# Patient Record
Sex: Female | Born: 1940 | Race: White | Hispanic: No | Marital: Married | Smoking: Former smoker
Health system: Southern US, Community
[De-identification: ages and names within clinical notes are randomized; demographics above are authoritative.]

## PROBLEM LIST (undated history)

## (undated) DIAGNOSIS — I10 Essential (primary) hypertension: Secondary | ICD-10-CM

## (undated) DIAGNOSIS — R031 Nonspecific low blood-pressure reading: Secondary | ICD-10-CM

## (undated) DIAGNOSIS — I480 Paroxysmal atrial fibrillation: Secondary | ICD-10-CM

## (undated) DIAGNOSIS — I5022 Chronic systolic (congestive) heart failure: Secondary | ICD-10-CM

## (undated) DIAGNOSIS — J449 Chronic obstructive pulmonary disease, unspecified: Secondary | ICD-10-CM

## (undated) DIAGNOSIS — E785 Hyperlipidemia, unspecified: Secondary | ICD-10-CM

## (undated) DIAGNOSIS — T82118A Breakdown (mechanical) of other cardiac electronic device, initial encounter: Secondary | ICD-10-CM

## (undated) DIAGNOSIS — Z95810 Presence of automatic (implantable) cardiac defibrillator: Secondary | ICD-10-CM

## (undated) DIAGNOSIS — I447 Left bundle-branch block, unspecified: Secondary | ICD-10-CM

## (undated) DIAGNOSIS — R569 Unspecified convulsions: Secondary | ICD-10-CM

## (undated) DIAGNOSIS — S065X9A Traumatic subdural hemorrhage with loss of consciousness of unspecified duration, initial encounter: Secondary | ICD-10-CM

## (undated) DIAGNOSIS — E876 Hypokalemia: Secondary | ICD-10-CM

## (undated) DIAGNOSIS — E119 Type 2 diabetes mellitus without complications: Secondary | ICD-10-CM

## (undated) DIAGNOSIS — I428 Other cardiomyopathies: Secondary | ICD-10-CM

## (undated) DIAGNOSIS — S065XAA Traumatic subdural hemorrhage with loss of consciousness status unknown, initial encounter: Secondary | ICD-10-CM

## (undated) DIAGNOSIS — I5023 Acute on chronic systolic (congestive) heart failure: Secondary | ICD-10-CM

## (undated) HISTORY — DX: Unspecified convulsions: R56.9

## (undated) HISTORY — PX: CARDIAC CATHETERIZATION: SHX172

## (undated) HISTORY — DX: Type 2 diabetes mellitus without complications: E11.9

## (undated) HISTORY — DX: Other cardiomyopathies: I42.8

## (undated) HISTORY — DX: Hyperlipidemia, unspecified: E78.5

## (undated) HISTORY — PX: CHOLECYSTECTOMY: SHX55

## (undated) HISTORY — DX: Essential (primary) hypertension: I10

## (undated) HISTORY — DX: Acute on chronic systolic (congestive) heart failure: I50.23

## (undated) HISTORY — DX: Left bundle-branch block, unspecified: I44.7

## (undated) HISTORY — DX: Paroxysmal atrial fibrillation: I48.0

## (undated) HISTORY — DX: Presence of automatic (implantable) cardiac defibrillator: Z95.810

## (undated) HISTORY — DX: Nonspecific low blood-pressure reading: R03.1

## (undated) HISTORY — DX: Hypokalemia: E87.6

## (undated) HISTORY — DX: Chronic obstructive pulmonary disease, unspecified: J44.9

## (undated) HISTORY — DX: Chronic systolic (congestive) heart failure: I50.22

## (undated) HISTORY — DX: Breakdown (mechanical) of other cardiac electronic device, initial encounter: T82.118A

## (undated) HISTORY — PX: BRAIN HEMATOMA EVACUATION: SHX573

## (undated) HISTORY — DX: Traumatic subdural hemorrhage with loss of consciousness status unknown, initial encounter: S06.5XAA

## (undated) HISTORY — DX: Traumatic subdural hemorrhage with loss of consciousness of unspecified duration, initial encounter: S06.5X9A

---

## 1981-01-16 HISTORY — PX: TOTAL ABDOMINAL HYSTERECTOMY: SHX209

## 2004-01-17 HISTORY — PX: CARDIAC DEFIBRILLATOR PLACEMENT: SHX171

## 2009-07-20 ENCOUNTER — Telehealth (INDEPENDENT_AMBULATORY_CARE_PROVIDER_SITE_OTHER): Payer: Self-pay | Admitting: *Deleted

## 2009-07-20 ENCOUNTER — Ambulatory Visit: Payer: Self-pay | Admitting: Cardiovascular Disease

## 2009-11-01 ENCOUNTER — Ambulatory Visit: Payer: Self-pay | Admitting: Cardiovascular Disease

## 2009-12-06 ENCOUNTER — Encounter: Payer: Self-pay | Admitting: Internal Medicine

## 2009-12-06 ENCOUNTER — Ambulatory Visit: Payer: Self-pay | Admitting: Internal Medicine

## 2010-01-18 ENCOUNTER — Ambulatory Visit
Admission: RE | Admit: 2010-01-18 | Discharge: 2010-01-18 | Payer: Self-pay | Source: Home / Self Care | Attending: Internal Medicine | Admitting: Internal Medicine

## 2010-01-18 ENCOUNTER — Encounter: Payer: Self-pay | Admitting: Internal Medicine

## 2010-02-15 NOTE — Procedures (Signed)
Summary: Cardiology Device Clinic    ICD Specifications Following MD:  Virl Axe, MD     ICD Vendor:  [facility] Jude     ICD Model Number:  [phone]     ICD Serial Number:  [id number] ICD DOI:  10/10/2004     ICD Implanting MD:  NOT IMPLANTED BY [place]  ICD Follow Up Remote Check?  No Battery Voltage:  2.54 V     Charge Time:  12 seconds     Underlying rhythm:  sr ICD Dependent:  No       ICD Device Measurements Atrium:  Amplitude: 2.1 mV, Impedance: 430 ohms, Threshold: 0.75 V at 0.5 msec Right Ventricle:  Amplitude: 6.3 mV, Impedance: 345 ohms, Threshold: 0.75 V at 0.5 msec Left Ventricle:  Impedance: 710 ohms, Threshold: 1.25 V at 0.8 msec  Episodes MS Episodes:  4     Percent Mode Switch:  <1%     Shock:  0     ATP:  0     Nonsustained:  0     Atrial Pacing:  64%     Ventricular Pacing:  99%  Brady Parameters Mode DDDR     Lower Rate Limit:  75     Upper Rate Limit 130 PAV 140     Sensed AV Delay:  120  Tachy Zones VF:  182     VT:  167     Next Cardiology Appt Due:  01/16/2010 Tech Comments:  Outputs reprogrammed for chronic thresholds.  Atlas battery 2.54v.  She reached 2.55 10/10.  We will follow her every 2 months in [place]. Alma Friendly, LPN  November 21, [id number] 4:05 PM

## 2010-02-15 NOTE — Progress Notes (Signed)
----   Converted from flag ---- ---- 07/20/2009 2:19 PM, Charm Rings, CMA, AAMA wrote: Pt has [employer] GC, no precert required  ---- [id number] 1:52 PM, Rhett Bannister wrote: Echo scheduled at [facility]. for 07/22/09 with Dx: 428.0. Thanks Anderson Malta ------------------------------

## 2010-02-15 NOTE — Progress Notes (Signed)
----   Converted from flag ---- ---- 07/20/2009 1:11 PM, Charm Rings, CMA, AAMA wrote: PT HAS [employer] GC, NO PRECERT REQ  ---- [id number] 12:01 PM, Rhett Bannister wrote: Echo scheduled for [facility]. on Thurs. 07/22/09 with Dx: Dede Query Thanks, Anderson Malta ------------------------------

## 2010-02-17 NOTE — Procedures (Signed)
Summary: Cardiology Device Clinic    ICD Specifications Following MD:  Virl Axe, MD     ICD Vendor:  [facility] Jude     ICD Model Number:  [phone]     ICD Serial Number:  [id number] ICD DOI:  10/10/2004     ICD Implanting MD:  NOT IMPLANTED BY [place]  ICD Follow Up Remote Check?  No Battery Voltage:  2.54 V     Charge Time:  12.0 seconds     Underlying rhythm:  Loletha Grayer ICD Dependent:  No       ICD Device Measurements Atrium:  Amplitude: 2.6 mV, Impedance: 435 ohms, Threshold: 0.75 V at 0.5 msec Right Ventricle:  Amplitude: 6.1 mV, Impedance: 370 ohms, Threshold: 0.75 V at 0.5 msec Left Ventricle:  Impedance: 750 ohms, Threshold: 1.25 V at 0.8 msec  Episodes MS Episodes:  2     Percent Mode Switch:  <1%     Shock:  0     ATP:  0     Nonsustained:  0     Atrial Pacing:  59%     Ventricular Pacing:  99%  Brady Parameters Mode DDDR     Lower Rate Limit:  75     Upper Rate Limit 130 PAV 140     Sensed AV Delay:  120  Tachy Zones VF:  182     VT:  167     Next Cardiology Appt Due:  03/17/2010 Tech Comments:  No parameter changes.  Device fuction normal.  Atlas device battery @ 2.55 since 10/10.  ROV 2 months with Dr. Caryl Comes in [place]. Alma Friendly, LPN  January  3, [id number] 10:17 AM

## 2010-03-08 ENCOUNTER — Ambulatory Visit (INDEPENDENT_AMBULATORY_CARE_PROVIDER_SITE_OTHER): Payer: Medicare PPO | Admitting: Cardiovascular Disease

## 2010-03-08 DIAGNOSIS — I5022 Chronic systolic (congestive) heart failure: Secondary | ICD-10-CM

## 2010-03-08 DIAGNOSIS — I4891 Unspecified atrial fibrillation: Secondary | ICD-10-CM

## 2010-03-08 DIAGNOSIS — E7800 Pure hypercholesterolemia, unspecified: Secondary | ICD-10-CM

## 2010-03-11 ENCOUNTER — Encounter: Payer: Self-pay | Admitting: Internal Medicine

## 2010-03-11 ENCOUNTER — Encounter (INDEPENDENT_AMBULATORY_CARE_PROVIDER_SITE_OTHER): Payer: Medicare PPO | Admitting: Internal Medicine

## 2010-03-11 DIAGNOSIS — I4891 Unspecified atrial fibrillation: Secondary | ICD-10-CM

## 2010-03-11 DIAGNOSIS — Z95810 Presence of automatic (implantable) cardiac defibrillator: Secondary | ICD-10-CM

## 2010-03-11 DIAGNOSIS — I428 Other cardiomyopathies: Secondary | ICD-10-CM

## 2010-03-14 ENCOUNTER — Ambulatory Visit: Payer: Self-pay | Admitting: Cardiovascular Disease

## 2010-03-24 NOTE — Assessment & Plan Note (Signed)
Summary: f9m/end of life/glc/glc     ICD Specifications Following MD:  Virl Axe, MD     ICD Vendor:  [employer]     ICD Model Number:  [phone]     ICD Serial Number:  [id number] ICD DOI:  10/10/2004     ICD Implanting MD:  NOT IMPLANTED BY [place]  ICD Follow Up ICD Dependent:  No      Brady Parameters Mode DDDR     Lower Rate Limit:  75     Upper Rate Limit 130 PAV 140     Sensed AV Delay:  120  Tachy Zones VF:  182     VT:  167

## 2010-03-24 NOTE — Cardiovascular Report (Signed)
Summary: Office Visit   Office Visit   Imported By: Sallee Provencal 03/17/2010 15:58:22  _____________________________________________________________________  External Attachment:    Type:   Image     Comment:   External Document

## 2010-04-08 NOTE — Assessment & Plan Note (Signed)
[place] CARDIOLOGY OFFICE NOTE  NAME:[name], [name]                          MRN:          [id number] DATE:03/08/2010                            DOB:          10-09-1940   [name] is a 70 year old female who is here today for a followup visit.  She has the following problem list: 1. 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  Her     previous ejection fraction was less than 35%.  Most recent ejection     fraction was normal as of July 2011. 2. No significant coronary artery disease per cardiac catheterization     in the past. 3. Paroxysmal atrial fibrillation on long-term anticoagulation which     is being managed by Dr. Jannette Fogo. 1. Status post implantable cardioverter-defibrillator placement for     primary prevention of sudden death which is biventricular. 2. Chronic obstructive pulmonary disease. 3. Type 2 diabetes. 4. Hyperlipidemia. 5. Vitamin D deficiency.  INTERVAL HISTORY:  The patient is here today for a followup visit. Overall, she has been doing very well.  There has been no change in her dyspnea which only happens if she overexerts herself.  There is no orthopnea or PND.  There is no lower extremity edema.  During her last visit with Dr. Caryl Comes, her device was interrogated and there were no episodes of atrial fibrillation.  Thus, her digoxin was stopped.  Also, her furosemide and potassium were cut in half.  Simvastatin dose was decreased also to 40 mg.  MEDICATIONS: 1. Carvedilol 25 mg twice daily. 2. Warfarin as directed. 3. Albuterol 2 puffs four times daily as needed. 4. TriCor 145 mg once daily. 5. Vitamin D once daily. 6. Simvastatin 40 mg at bedtime. 7. Furosemide 40 mg half a tablet once daily. 8. Potassium 10 mEq half tablet once daily. 9. Quinapril 20 mg half tablet once daily. 10.Januvia 100 mg once daily.  PHYSICAL EXAMINATION:  VITAL SIGNS:  Weight is 131.4 pounds,  blood pressure is 126/65, pulse is 75, oxygen saturation is 97% on room air. NECK:  No JVD or carotid bruits. LUNGS:  Clear to auscultation. HEART:  Regular rate and rhythm with no gallops or murmurs. ABDOMEN:  Benign, nontender, nondistended. EXTREMITIES:  With no clubbing, cyanosis, or edema.  IMPRESSION: 1. Congestive heart failure:  She continues to be in [place] Heart     Association Class II.  She has no evidence of fluid overload in     spite of cutting the dose of furosemide by half.  We will continue     with current management for now.  I agree with stopping digoxin     given that she has not been having frequent episodes of atrial     fibrillation, and her ejection fraction has normalized.  We will     consider stopping all her diuretic in the near future if she     remains stable. 2. Paroxysmal atrial fibrillation:  We will continue long-term     anticoagulation with warfarin.  She has not had any recent episodes     of  this. 3. Hyperlipidemia:  Continue with simvastatin 40 mg at bedtime. 4. Status post implantable cardioverter-defibrillator placement, the     device approaching end of life.  She will be following up with Dr.     Caryl Comes to discuss further management options.  She will follow up     with me in 6 months from now or earlier if needed.    Kathlyn Sacramento, MD Electronically Signed   MA/MedQ  DD: 03/08/2010  DT: 03/09/2010  Job #: [id number]

## 2010-04-08 NOTE — Assessment & Plan Note (Signed)
[place] CARDIOLOGY OFFICE NOTE  NAME:[name], [name]                          MRN:          [id number] DATE:03/11/2010                            DOB:          August 25, 1940   [name] is seen in followup for CRT-D implanted at [facility] for nonischemic cardiomyopathy.  There has been intercurrent normalization of left ventricular systolic function by an echo last summer demonstrating an ejection fraction of 55%-60%.  The patient has no symptoms of congestive heart failure.  She remains on carvedilol, warfarin, nebulizer, simvastatin 20, furosemide and potassium.  There has been gradual down titration of her diuretics.  ON EXAMINATION:  Her blood pressure is 105/60.  Her weight was 133.  Her pulse was 75, all of which were stable.  Her lungs were clear. HEART:  Sounds were regular. ABDOMEN:  Soft with active bowel sounds. EXTREMITIES:  Without edema. SKIN:  Warm and dry.  She was in no acute distress.  LABORATORIES:  From September 2011 demonstrated BUN 22, creatinine 1.3 with a subsequent down titration of her diuretics.  Interrogation of her [place] CRT-D demonstrated a battery voltage of 2.54.  the P-wave was 2.5, RO of 5.9, thresholds were 0.75/0.75/1.25- L. Impedances were 425-A, 340-R, 740-L.  High-voltage impedance was 67 ohms.  No intercurrent therapies.  IMPRESSION: 1. Nonischemic cardiomyopathy. 2. Paroxysmal atrial fibrillation with intercurrent nonsustained     episodes. 3. Status post cardiac resynchronization therapy device approaching     ERI with estimated longevity of 5-7 months.  We will plan to see her again in 3 months.    Deboraha Sprang, MD, [facility]    SCK/MedQ  DD: 03/11/2010  DT: 03/11/2010  Job #: [id number]

## 2010-05-31 NOTE — Assessment & Plan Note (Signed)
[place] CARDIOLOGY OFFICE NOTE   NAME:[name], [name]                          MRN:          [id number]  DATE:11/01/2009                            DOB:          Nov 28, 1940    [name] is a 70 year old female who is here today for a followup  visit.  She has the following problem list:  1. 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      Previous ejection fraction was less than 35%.  Most recent ejection      fraction was normal as of July 2011.  2. No significant coronary artery disease per cardiac catheterization      in the past.  3. Paroxysmal atrial fibrillation on long-term anticoagulation.  4. Status post implantable cardioverter-defibrillator placement for      primary prevention of sudden death.  5. Chronic obstructive pulmonary disease.  6. Type 2 diabetes.  7. Hyperlipidemia.  8. Vitamin D deficiency.   The patient is here today for a followup visit.  Since her last visit,  the patient underwent cholecystectomy without any complications.  She  resumed anticoagulation postoperatively with no bleeding issues.  She  continues to do well.  She denies any chest pain.  She does have  exertional dyspnea only at significant activity level.  She gets  occasional palpitations.  There has been no syncope or presyncope.   MEDICATIONS:  1. Digoxin 0.125 mg once daily.  2. Quinapril 20 mg half a tablet daily.  3. Potassium 10 mEq, 1.5 tablet once daily.  4. Furosemide 40 mg once daily.  5. Carvedilol 25 mg twice daily.  6. Warfarin as directed.  7. Albuterol.  8. TriCor 145 mg once daily.  9. Simvastatin 80 mg at bedtime.   ALLERGIES:  CODEINE and VALIUM.   PHYSICAL EXAMINATION:  VITAL SIGNS:  Weight is 128 pounds, blood  pressure is 109/58, pulse is 74, oxygen saturation is 97% on room air.  NECK:  No JVD or carotid bruits.  LUNGS:  Clear to auscultation.  HEART:  Regular rate and rhythm  with no gallops or murmurs.  ABDOMEN:  Benign, nontender, nondistended.  EXTREMITIES:  With no edema bilaterally.   IMPRESSION:  1. Congestive heart failure:  Currently, she is in [place] class II.  Most recent ejection fraction was back to      normal.  We will continue treatment with an ACE inhibitor and      carvedilol.  She appears to be euvolemic on current dose of      furosemide.  I believe she is on digoxin for atrial fibrillation      and not heart failure.  2. Paroxysmal atrial fibrillation:  We will have her see Dr. Caryl Comes for      device interrogation.  If she is not having frequent episodes of      atrial fibrillation, then we will probably stop digoxin altogether      given that her ejection fraction is normal now.  We will continue  long-term anticoagulation with warfarin.  3. Hyperlipidemia:  She did have recent labs done at her primary care      physician's office Dr. Elnoria Howard.  I will request these labs.  I will      consider decreasing the dose of simvastatin to 80 mg given      especially that she is also on Tricor.  She will follow up in 4      months from now or earlier if needed.     Kathlyn Sacramento, MD  Electronically Signed    MA/MedQ  DD: 11/01/2009  DT: 11/02/2009  Job #: [id number]

## 2010-05-31 NOTE — Assessment & Plan Note (Signed)
[place] CARDIOLOGY OFFICE NOTE   NAME:[name], [name]                          MRN:          [id number]  DATE:01/18/2010                            DOB:          06-Jan-1941    [name] was seen in followup for CRT-D implanted in [facility].  She  is now approaching URI.  She has had intercurrent normalization of left  ventricular systolic function.   She has a history of PAF.   She denies significant symptoms of shortness of breath, chest pain, or  peripheral edema.   Medications are reviewed and are unchanged except for the decrease of  her simvastatin from 80 to 40 mg and furosemide from 40 to 20 and  potassium from 20 to 10.   PHYSICAL EXAMINATION:  VITAL SIGNS:  On examination today, her blood  pressure was 127/76 with pulse of 75, the weight was 129.  LUNGS:  Clear.  HEART:  Heart sounds were regular.  ABDOMEN:  Soft.  EXTREMITIES:  Without edema.   Interrogation of her pacemaker demonstrated that her battery voltage was  2.54.  She is on the plateau for about 14-15 months.   We will need to check her again in 2 months' time given the end-of-life  characteristics of this device.     [name] Sprang, MD, [facility]     SCK/MedQ  DD: 01/18/2010  DT: 01/19/2010  Job #: [id number]

## 2010-05-31 NOTE — Letter (Signed)
July 20, 2009    Kendell Bane, MD  [facility], [place] [address]   RE:  REEDA, HOLLERS  MRN:  [id number]  /  DOB:  [birth year]   REASON FOR CONSULTATION:  Preoperative evaluation for cholecystectomy.   Dear Dr. Amalia Hailey:   Thank you for referring Ms. [name] for preoperative cardiovascular  evaluation.  As you are aware, this is a pleasant 70 year old female  with extensive cardiac history.  She has a history of congestive heart  failure due to nonischemic cardiomyopathy.  Her ejection fraction was  quite low in the past and less than 35%.  Her cardiac evaluation at that  time showed no significant coronary artery disease.  She underwent an  implantable cardioverter-defibrillator placement about 4 years ago and  since then, she has been relatively stable.  She does have history of  paroxysmal atrial fibrillation on long-term anticoagulation.  Most of  her heart failure symptoms at this time include dyspnea with activities.  She has no significant orthopnea or paroxysmal nocturnal dyspnea.  There  is no chest pain.  Overall, she is able to perform her activities of  daily living and walks 2 to 3 times a day outside the house.  She seems  to be a [place] Heart Association class II functional capacity.  She is  planned to have a cholecystectomy done.   PAST MEDICAL HISTORY:  1. 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  Most      recent ejection fraction was 50% to 55% in 2010.  2. No significant coronary artery disease per previous      catheterization.  3. Paroxysmal atrial fibrillation on long-term anticoagulation.  4. Status post implantable cardioverter-defibrillator placement for      primary prevention of sudden death.  5. Chronic obstructive pulmonary disease.  6. Type 2 diabetes.  7. Hyperlipidemia.  8. Vitamin D deficiency.   ALLERGIES:  CODEINE and VALIUM.   CURRENT MEDICATIONS:  1. Vitamin D.  This has been  stopped recently.  2. Digoxin 0.125 mg once daily.  3. Quinapril 10 mg daily.  4. Potassium chloride 10 mEq 1-1/2 tablets daily.  5. Furosemide 40 mg once a day.  6. Carvedilol 25 mg twice daily.  7. Warfarin as directed.  8. TriCor 145 mg once daily.  9. Simvastatin 80 mg once daily.   SOCIAL HISTORY:  Negative for smoking.  She quit in 1980.  There is no  history of alcohol or recreational drug use.   FAMILY HISTORY:  Negative for premature coronary artery disease.   PHYSICAL EXAMINATION:  VITAL SIGNS:  Weight is 130.4 pounds, blood  pressure 115/64 and heart rate is 75 beats per minute.  NECK EXAMINATION:  Reveals no JVD or carotid bruits.  LUNGS:  Clear to auscultation.  CARDIOVASCULAR EXAMINATION:  Her point of maximal impulse is slightly  laterally displaced, normal S1/S2.  There are no gallops or murmurs.  ABDOMEN:  Benign, nontender, nondistended.  EXTREMITIES:  Trace edema bilaterally.  There is no clubbing or  cyanosis.   LABORATORY AND DIAGNOSTIC DATA:  Her electrocardiogram shows  arteriovenous sequential pacing.   IMPRESSION:  1. Preoperative evaluation for cholecystectomy:  The patient has an      extensive cardiac history including congestive heart failure.  She      has no history of coronary artery disease.  Her most recent      ejection fraction was 50% to 55%.  She appears to  be clinically      stable and has reasonable functional capacity.  Thus, she should be      considered at low-to-moderate risk for planned surgery.  I do not      think a stress test is needed in this situation.  I would like to      get an echocardiogram done before her surgery to ensure no      deterioration in her ejection fraction or valvular disease.  Most      recent echocardiogram showed an ejection fraction of 50% to 55%      with mild-to-moderate tricuspid regurgitation.  If her      echocardiogram is not significantly changed, then she can proceed      with the planned  surgery with an acceptable risk.  Regarding      management of her medications, I recommend the following:      a.     Her warfarin can be discontinued 5-7 days before surgery       without the need for bridging.  She has no previous history of       stroke or embolic complications.      b.     Her cardiac medications should be continued except for       stopping her furosemide on the day of her surgery.      c.     She does have a defibrillator which is a [employer] Jude device.  I       recommend placing a magnet on the device during the surgery and       then removing it after the surgery.  2. Congestive heart failure with reduced left ventricle systolic      function.  She is currently on good medical therapy including a      beta blocker, ACE inhibitor, digoxin and diuretic.  I would      continue with current medications.  If her ejection fraction is      close to normal, I would consider stopping digoxin in the future.  3. Paroxysmal atrial fibrillation.  I will continue with long-term      anticoagulation but will hold warfarin for planned surgery.  4. Hyperlipidemia:  She is on high-dose simvastatin and TriCor and      seems to be tolerating it.  I would consider reducing the dose of      simvastatin to 40 mg.   Thank you for allowing me to participate in the care of your patient.    Sincerely,      Kathlyn Sacramento, MD  Electronically Signed    MA/MedQ  DD: 07/20/2009  DT: 07/20/2009  Job #: [id number]

## 2010-05-31 NOTE — Assessment & Plan Note (Signed)
[place] CARDIOLOGY OFFICE NOTE   NAME:Casciano, Serrina                          MRN:          [id number]  DATE:12/06/2009                            DOB:          01/25/1940    Ms. Haslem is seen at the request of Dr. Fletcher Anon to establish device  followup.   She is a 70 year old woman with a nonischemic cardiomyopathy identified  about 5 years ago while being seen in [place].  She had nonischemic  heart disease by catheterization and ejection fraction of about 10% if  she recalls.  She underwent CRTD implantation for primary prevention.   Most recent ejection fraction assessment was in July of this year  demonstrating normal LV function.   She also has a history of PAF for which she takes Coumadin.  Thromboembolic risk factors are notable for age, gender, diabetes, and  congestive failure.   She currently denies significant shortness of breath or chest  discomfort.  There has been no peripheral edema.   Medications include digoxin 0.125, quinapril 10, potassium, furosemide,  carvedilol 25 b.i.d., warfarin, albuterol, TriCor 145, simvastatin 80,  and vitamin D.   Allergies include CODEINE and VALIUM.   PAST MEDICAL HISTORY:  Notable as above in addition to COPD.   SOCIAL HISTORY:  Negative for smoking.  No alcohol or recreational drug  use.   FAMILY HISTORY:  Negative for coronary disease.   PRIOR SURGERIES:  Include hysterectomy.   PHYSICAL EXAMINATION:  GENERAL:  She is an elderly Caucasian female,  appearing her stated age of 61.  VITAL SIGNS:  Her blood pressure is 119/57, her pulse is 74, weight is  129.6.  HEENT:  Normal.  NECK:  Her neck veins are flat.  Her carotids are brisk and full  bilaterally without bruits and there is no lymphadenopathy.  BACK:  Without scoliosis or mild kyphosis.  LUNGS:  Clear.  HEART:  Heart sounds were regular without murmurs or gallops.  ABDOMEN:  Soft with active  bowel sounds.  EXTREMITIES:  Without edema.  Distal pulses are intact.  NEUROLOGICAL:  Grossly normal.  SKIN:  Warm and dry.  PSYCHIATRIC:  Her affect was engaging.   Electrocardiogram reviewed from July 20, 2009, demonstrated an AV paced  rhythm at 75.   Interrogation of her outlet ICD demonstrates a P-wave of 2.1, impedance  of 430, threshold of 0.75, the R-wave was 6.3, with a pace impedance of  345 with threshold of 0.75 and LV impedance of 710 with threshold of  1.25.  Battery voltage 2.54, having just come off of the 2.55 plateau  after about a year.   IMPRESSION:  1. Nonischemic cardiomyopathy with normalization of left ventricular      function over time and with cardiac resynchronization therapy      defibrillator.  2. Status post cardiac resynchronization therapy defibrillator is with      a caveat as noted above, approaching the RI.  3. Paroxysmal atrial fibrillation without significant intercurrent      events identified by her device.  Her 4 episodes were all of  less      than 1 minute duration.  4. Hyperlipidemia.   Ms. Deng is doing amazingly well with the intercurrent normalization  of her LV systolic function.  She is approaching the RI, we will see her  on a 20-month followup basis as she approaches that plateau.   With normalization of LV function, we will discontinue digoxin.   Given the recent issues with high-dose simvastatin, we will decrease  this to 40 mg a day.  In addition, I have decreased her furosemide from  40 - 20 mg and has changed her potassium to 10 mg a day.     Deboraha Sprang, MD, [facility]     SCK/MedQ  DD: 12/06/2009  DT: 12/07/2009  Job #: [id number]

## 2010-06-09 ENCOUNTER — Ambulatory Visit (INDEPENDENT_AMBULATORY_CARE_PROVIDER_SITE_OTHER): Payer: Medicare PPO | Admitting: *Deleted

## 2010-06-09 ENCOUNTER — Encounter: Payer: Medicare PPO | Admitting: *Deleted

## 2010-06-09 DIAGNOSIS — I428 Other cardiomyopathies: Secondary | ICD-10-CM

## 2010-08-15 ENCOUNTER — Encounter: Payer: Self-pay | Admitting: Internal Medicine

## 2010-08-29 ENCOUNTER — Encounter: Payer: Self-pay | Admitting: Cardiovascular Disease

## 2010-09-01 ENCOUNTER — Ambulatory Visit (INDEPENDENT_AMBULATORY_CARE_PROVIDER_SITE_OTHER): Payer: Medicare PPO | Admitting: Cardiovascular Disease

## 2010-09-01 ENCOUNTER — Encounter: Payer: Self-pay | Admitting: Cardiovascular Disease

## 2010-09-01 DIAGNOSIS — I4891 Unspecified atrial fibrillation: Secondary | ICD-10-CM

## 2010-09-01 DIAGNOSIS — Z95810 Presence of automatic (implantable) cardiac defibrillator: Secondary | ICD-10-CM

## 2010-09-01 DIAGNOSIS — I480 Paroxysmal atrial fibrillation: Secondary | ICD-10-CM | POA: Insufficient documentation

## 2010-09-01 DIAGNOSIS — I10 Essential (primary) hypertension: Secondary | ICD-10-CM

## 2010-09-01 DIAGNOSIS — I509 Heart failure, unspecified: Secondary | ICD-10-CM

## 2010-09-01 MED ORDER — QUINAPRIL HCL 10 MG PO TABS: 10.0000 mg | ORAL_TABLET | Freq: Every day | ORAL | Status: DC

## 2010-09-01 NOTE — Assessment & Plan Note (Signed)
Continue long-term anticoagulation with warfarin which is being managed by her primary care physician.

## 2010-09-01 NOTE — Assessment & Plan Note (Signed)
Patient seems to be doing reasonably well at this time. She appears to be euvolemic on current dose of furosemide. Her most recent ejection fraction was normal. Continue treatment with carvedilol as well as quinapril which will be increased to 10 mg once daily.

## 2010-09-01 NOTE — Assessment & Plan Note (Signed)
Her blood pressure is elevated. Accupril will be increased to 10 mg once daily.

## 2010-09-01 NOTE — Patient Instructions (Signed)
Your physician recommends that you schedule a follow-up appointment in: 4 months  Your physician has recommended you make the following change in your medication: INCREASE Accupril to 10 mg daily

## 2010-09-01 NOTE — Assessment & Plan Note (Signed)
The patient is supposedly at end-of-life of battery. I will schedule her that the device clinic next month and she will likely need to followup with Dr. Caryl Comes soon.

## 2010-09-01 NOTE — Progress Notes (Signed)
HPI  This is a 70 year old female who is here today for a followup visit. 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 She had a previous cardiac catheterization that showed no significant coronary artery disease. She also has paroxysmal atrial fibrillation on long-term anticoagulation with warfarin which is being managed by her primary care physician. She is status post ICD CRT placement. Overall she has been doing reasonably well. She denies any chest pain. There has been no change in her dyspnea. There is no palpitations, syncope or presyncope.  Allergies  Allergen Reactions  . Codeine   . Valium      Current Outpatient Prescriptions on File Prior to Visit  Medication Sig Dispense Refill  . ALBUTEROL SULFATE IN Inhale into the lungs.        . cyanocobalamin (,VITAMIN B-12,) 1000 MCG/ML injection Inject 1,000 mcg into the muscle every 30 (thirty) days.        . ergocalciferol (VITAMIN D2) 50000 UNITS capsule Take 50,000 Units by mouth 2 (two) times a week.        . furosemide (LASIX) 20 MG tablet Take 10 mg by mouth daily.       . potassium chloride (KLOR-CON) 10 MEQ CR tablet Take 5 mEq by mouth daily.       . simvastatin (ZOCOR) 40 MG tablet Take 20 mg by mouth at bedtime.        . sitaGLIPtan (JANUVIA) 100 MG tablet Take 100 mg by mouth daily.        [name] warfarin (COUMADIN) 6 MG tablet Take 6 mg by mouth daily. As directed          Past Medical History  Diagnosis Date  . NICM (nonischemic cardiomyopathy)   . COPD (chronic obstructive pulmonary disease)   . DM2 (diabetes mellitus, type 2)   . HLD (hyperlipidemia)   . Vitamin D deficiency   . CHF (congestive heart failure)     initial EF of 35%. Most recently was normal in 07/2009  . PAF (paroxysmal atrial fibrillation)   . Hypertension      Past Surgical History  Procedure  Date  . Cardiac defibrillator placement 2006  . Total abdominal hysterectomy 1983  . Cardiac catheterization     no significant CAD     Family History  Problem Relation Age of Onset  . Heart disease       History   Social History  . Marital Status: Married    Spouse Name: N/A    Number of Children: 4  . Years of Education: N/A   Occupational History  . retired    Social History Main Topics  . Smoking status: Former Smoker    Quit date: 01/17/1968  . Smokeless tobacco: Not on file  . Alcohol Use: No  . Drug Use: No  . Sexually Active: Not on file   Other Topics Concern  . Not on file   Social History Narrative   Registration notes - ICD = [employer]      PHYSICAL EXAM   BP 150/78  Pulse 75  Ht 5\' 4"  (1.626 m)  Wt 141 lb (63.957 kg)  BMI 24.20 kg/m2  SpO2 97%  Constitutional: She is oriented to person, place, and time. She appears well-developed and well-nourished. No distress.  HENT: No nasal discharge.  Head: Normocephalic and atraumatic.  Eyes: Pupils are equal,  round, and reactive to light. Right eye exhibits no discharge. Left eye exhibits no discharge.  Neck: Normal range of motion. Neck supple. No JVD present. No thyromegaly present.  Cardiovascular: Normal rate, regular rhythm, normal heart sounds and intact distal pulses. Exam reveals no gallop and no friction rub.  No murmur heard.  Pulmonary/Chest: Effort normal and breath sounds normal. No stridor. No respiratory distress. She has no wheezes. She has no rales. She exhibits no tenderness.  Abdominal: Soft. Bowel sounds are normal. She exhibits no distension. There is no tenderness. There is no rebound and no guarding.  Musculoskeletal: Normal range of motion. She exhibits no edema and no tenderness.  Neurological: She is alert and oriented to person, place, and time. Coordination normal.  Skin: Skin is warm and dry. No rash noted. She is not diaphoretic. No erythema. No pallor.  Psychiatric: She  has a normal mood and affect. Her behavior is normal. Judgment and thought content normal.      ASSESSMENT AND PLAN

## 2010-09-15 ENCOUNTER — Encounter: Payer: Self-pay | Admitting: Cardiovascular Disease

## 2010-10-04 ENCOUNTER — Ambulatory Visit (INDEPENDENT_AMBULATORY_CARE_PROVIDER_SITE_OTHER): Payer: Medicare PPO | Admitting: *Deleted

## 2010-10-04 ENCOUNTER — Encounter: Payer: Self-pay | Admitting: Internal Medicine

## 2010-10-04 DIAGNOSIS — I428 Other cardiomyopathies: Secondary | ICD-10-CM

## 2010-10-04 LAB — ICD DEVICE OBSERVATION
AL THRESHOLD: 0.75 V
ATRIAL PACING ICD: 42 pct
BAMS-0001: 180 {beats}/min
BAMS-0003: 75 {beats}/min
DEVICE MODEL ICD: 330633
LV LEAD IMPEDENCE ICD: 810 Ohm
RV LEAD AMPLITUDE: 6.4 mv
TOT-0008: 0
TOT-0009: 0
TOT-0010: 39
TZAT-0004SLOWVT: 8
TZAT-0012SLOWVT: 200 ms
TZAT-0018SLOWVT: NEGATIVE
TZAT-0019SLOWVT: 7.5 V
TZAT-0020SLOWVT: 1 ms
TZON-0003SLOWVT: 360 ms
TZON-0004SLOWVT: 12
TZON-0005SLOWVT: 6
TZON-0010SLOWVT: 80 ms
TZST-0001SLOWVT: 3
TZST-0001SLOWVT: 4
TZST-0003SLOWVT: 36 J
TZST-0003SLOWVT: 36 J

## 2010-10-04 NOTE — Progress Notes (Signed)
ICD check

## 2010-12-05 ENCOUNTER — Ambulatory Visit (HOSPITAL_COMMUNITY)
Admission: RE | Admit: 2010-12-05 | Discharge: 2010-12-05 | Disposition: A | Discharge status: Home / Self Care | Payer: Medicare PPO | Source: Ambulatory Visit | Attending: Internal Medicine | Admitting: Internal Medicine

## 2010-12-05 ENCOUNTER — Other Ambulatory Visit: Payer: Self-pay | Admitting: *Deleted

## 2010-12-05 ENCOUNTER — Encounter: Payer: Self-pay | Admitting: Internal Medicine

## 2010-12-05 ENCOUNTER — Ambulatory Visit (INDEPENDENT_AMBULATORY_CARE_PROVIDER_SITE_OTHER): Payer: Medicare PPO | Admitting: *Deleted

## 2010-12-05 DIAGNOSIS — J4489 Other specified chronic obstructive pulmonary disease: Secondary | ICD-10-CM | POA: Insufficient documentation

## 2010-12-05 DIAGNOSIS — Z95810 Presence of automatic (implantable) cardiac defibrillator: Secondary | ICD-10-CM

## 2010-12-05 DIAGNOSIS — I428 Other cardiomyopathies: Secondary | ICD-10-CM

## 2010-12-05 DIAGNOSIS — R0602 Shortness of breath: Secondary | ICD-10-CM | POA: Insufficient documentation

## 2010-12-05 DIAGNOSIS — R079 Chest pain, unspecified: Secondary | ICD-10-CM | POA: Insufficient documentation

## 2010-12-05 DIAGNOSIS — J449 Chronic obstructive pulmonary disease, unspecified: Secondary | ICD-10-CM | POA: Insufficient documentation

## 2010-12-05 LAB — ICD DEVICE OBSERVATION
AL AMPLITUDE: 2.3 mv
AL IMPEDENCE ICD: 420 Ohm
AL THRESHOLD: 0.75 V
CHARGE TIME: 0 s
DEV-0020ICD: NEGATIVE
LV LEAD THRESHOLD: 1 V
MODE SWITCH EPISODES: 1
RV LEAD AMPLITUDE: 6 mv
RV LEAD THRESHOLD: 0.75 V
TOT-0010: 39
TZAT-0001SLOWVT: 1
TZAT-0013SLOWVT: 3
TZAT-0020SLOWVT: 1 ms
TZON-0005SLOWVT: 6
TZON-0010SLOWVT: 80 ms
TZST-0001SLOWVT: 2
TZST-0001SLOWVT: 4
TZST-0003SLOWVT: 36 J
TZST-0003SLOWVT: 36 J
VENTRICULAR PACING ICD: 99 pct

## 2010-12-05 NOTE — Progress Notes (Signed)
icd check in clinic  

## 2010-12-05 NOTE — Progress Notes (Signed)
Addended by: Alvis Lemmings C on: 12/05/2010 10:24 AM   Modules accepted: Orders

## 2010-12-23 DIAGNOSIS — I4891 Unspecified atrial fibrillation: Secondary | ICD-10-CM

## 2010-12-23 DIAGNOSIS — I2510 Atherosclerotic heart disease of native coronary artery without angina pectoris: Secondary | ICD-10-CM

## 2010-12-23 DIAGNOSIS — E7800 Pure hypercholesterolemia, unspecified: Secondary | ICD-10-CM

## 2010-12-23 DIAGNOSIS — I10 Essential (primary) hypertension: Secondary | ICD-10-CM

## 2010-12-23 DIAGNOSIS — J449 Chronic obstructive pulmonary disease, unspecified: Secondary | ICD-10-CM

## 2010-12-23 DIAGNOSIS — S065XAA Traumatic subdural hemorrhage with loss of consciousness status unknown, initial encounter: Secondary | ICD-10-CM | POA: Insufficient documentation

## 2010-12-23 HISTORY — DX: Essential (primary) hypertension: I10

## 2010-12-23 HISTORY — DX: Pure hypercholesterolemia, unspecified: E78.00

## 2010-12-23 HISTORY — DX: Unspecified atrial fibrillation: I48.91

## 2010-12-23 HISTORY — DX: Atherosclerotic heart disease of native coronary artery without angina pectoris: I25.10

## 2010-12-23 HISTORY — DX: Chronic obstructive pulmonary disease, unspecified: J44.9

## 2011-01-02 ENCOUNTER — Encounter: Payer: Medicare PPO | Admitting: *Deleted

## 2011-01-03 DIAGNOSIS — K2970 Gastritis, unspecified, without bleeding: Secondary | ICD-10-CM

## 2011-01-03 DIAGNOSIS — D649 Anemia, unspecified: Secondary | ICD-10-CM

## 2011-01-03 DIAGNOSIS — R197 Diarrhea, unspecified: Secondary | ICD-10-CM

## 2011-01-03 HISTORY — DX: Anemia, unspecified: D64.9

## 2011-01-03 HISTORY — DX: Diarrhea, unspecified: R19.7

## 2011-01-03 HISTORY — DX: Hypomagnesemia: E83.42

## 2011-01-03 HISTORY — DX: Gastritis, unspecified, without bleeding: K29.70

## 2011-01-05 ENCOUNTER — Encounter: Payer: Self-pay | Admitting: Internal Medicine

## 2011-01-05 NOTE — PMR Pre-admission (Signed)
[facility] PMR Admission Coordinator Pre-Admission Assessment  Patient Details Name: [name] Date of Birth: 10/07/1940 Height:  5\' 4"  (162.6 cm) Weight:  63.957 kg (141 lb) Patients Current Diet:  Regular  Insurance Information: [id number] MCR     PRIMARY:[employer] MCR      Policy#:[id number]      Subscriber:self CM Name:Kathy      Phone#:[phone] x [phone]     [id number] Pre-Cert#:[id number] for 1 week until 01/13/11 (updates due 01/12/11)      Employer:retired Benefits:  Phone #:[phone]     Name:Ela Eff. Date:01/16/10     Deduct:$0      Out of Pocket Max:$3400/ $818.99      Life Max:62million CIR:$195/day 1-6; $0/day 7-      SNF:$0/day 1-7; $50/day 8-100 Outpatient:     Co-Pay:$35/visit for initial eval the $10 copay oer visit Home Health:100%      Co-Pay:0% DME:80%     Co-Pay:20% Providers:in network  Patient initially approved x 1 week until 01/13/11. Updated clinicals due on 01/12/11.   Current Medical History:   Patient Admitting Diagnosis: Right SDH with crani for evacuation of hemorrhage History of Present Illness: Patient was found down by her husband at home around 2:30 in the morning. It appears she fell and hit her head while in the bathroom. She was transported by local EMS to [facility] where she began to have seizures and then was intubated and transported to [facility] for treatment. She was admitted on 12/23/10. Repeat CT shows slightly enlarged hemorrhages. She received 3 units FFP and PRBCs at outside facility. Patient then taken to OR for crani and evacuation of hemorrhages.    Prior Rehab/Hospitalizations: no prior rehab   Medications   Current Medications: Current outpatient prescriptions:ALBUTEROL SULFATE IN, Inhale into the lungs.  , Disp: , Rfl: ;  carvedilol (COREG) 25 MG tablet, Take 25 mg by mouth 2 (two) times daily with a meal.  , Disp: , Rfl: ;  Choline Fenofibrate (TRILIPIX) 135 MG capsule, Take 135 mg by mouth daily.  , Disp: ,  Rfl: ;  cyanocobalamin (,VITAMIN B-12,) 1000 MCG/ML injection, Inject 1,000 mcg into the muscle every 30 (thirty) days.  , Disp: , Rfl:  ergocalciferol (VITAMIN D2) 50000 UNITS capsule, Take 50,000 Units by mouth 2 (two) times a week.  , Disp: , Rfl: ;  furosemide (LASIX) 20 MG tablet, Take 20 mg by mouth daily. , Disp: , Rfl: ;  lactase (LACTAID) 3000 UNITS tablet, Take 1 tablet by mouth daily.  , Disp: , Rfl: ;  potassium chloride (KLOR-CON) 10 MEQ CR tablet, Take 5 mEq by mouth daily. , Disp: , Rfl:  quinapril (ACCUPRIL) 10 MG tablet, Take 1 tablet (10 mg total) by mouth at bedtime., Disp: 30 tablet, Rfl: 6;  simvastatin (ZOCOR) 40 MG tablet, Take 20 mg by mouth at bedtime. , Disp: , Rfl: ;  sitaGLIPtan (JANUVIA) 100 MG tablet, Take 100 mg by mouth daily.  , Disp: , Rfl: ;  warfarin (COUMADIN) 6 MG tablet, Take 6 mg by mouth daily. As directed , Disp: , Rfl:   Past Medical History: CHF (congestive heart failure) PAF (paroxysmal atrial fibrillation) Hypertension ICD (implantable cardiac defibrillator) in place  Family History: unknown  Precautions/Special Needs:   Precautions/Special Needs: Behavior Behavior Precautions: impulsive, decreased safety awareness Other Precautions: falls Conditions/Impairments that will impact rehabilitation: Balance;Neglect/Inattention;Coordination Balance Conditions/Impairments: weakness on left  Home Living:   Lives With: Spouse Receives Help From: Family Type of Home: House Home Layout: One  level;Full bath on main level;Able to live on main level with bedroom/bathroom Home Access: Ramped entrance Bathroom Shower/Tub: Chiropodist: Standard Bathroom Accessibility: Yes How Accessible: Accessible via walker Home Adaptive Equipment: Grab bars around toilet;Grab bars in shower (husband says they have some equip "in shed" but not sure)  Prior Function:  Level of Independence: Independent with basic ADLs;Independent with homemaking with  ambulation;Independent with gait;Independent with transfers;Other (comment) (very independent prior to admission) Able to Take Stairs?: Yes Driving: Yes Vocation: Retired   Prior Activity Level: 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 prior to admission  Home Assistive Devices/Equipment:   Home Assistive Devices/Equipment: Eyeglasses  Discharge Planning: Living Arrangements: Spouse/significant other Support Systems: Spouse/significant other;Children Assistance Needed: will need at least supervision after discharge Do you have any problems obtaining your medications?: No Type of Residence: Private residence Lydia:  (unknown at this time) Patient expects to be discharged to:: home Expected Discharge Date:  (to be determined) Case Management Consult Needed: Yes (Comment)   Prior Functional Level Current Functional Level  Bed Mobility  Independent  Mod A   Transfers  Independent  mod A   Mobility - Walk/Wheelchair  Independent  70ft forward and backward Min A RW (poor dynamic standing)   Upper Body Dressing  Independent  CGA   Lower Body Dressing  Independent  min A   Grooming  Independent  CGA   Eating/Drinking  Independent  set up   Toilet Transfer  Independent  min A   Bladder Continence   Independent      Bowel Management  Independent  diarrhea daily   Stair Climbing  Independent  not tried   Communication  Independent  speaks slowly   Memory  Independent  WNL   Cooking/Meal Prep  Independent    Housework  Independent    Money Management  Independent    Driving  Independent     Prior Functional Levels:  Bed Mobility: Independent Transfers: Independent Mobility - Walk/Wheelchair: Independent Upper Body Dressing: Independent Lower Body Dressing: Independent Grooming: Independent Eating/Drinking: Independent Toilet Transfer: Independent Bladder Continence: Independent Bowel Management:  Independent Stair Climbing: Independent Communication: Independent Memory: Independent Cooking/Meal Prep: Independent Housework: Independent Money Management: Independent Driving: Independent  Current Functional Levels: Bed Mobility: Mod A Transfers: mod A Mobility - Walk/Wheelchair: 85ft forward and backward Min A RW (poor dynamic standing) Upper Body Dressing: CGA Lower Body Dressing: min A Grooming: CGA Eating/Drinking: set up Toilet Transfer: min A Bowel Management: diarrhea daily Stair Climbing: not tried Communication: speaks slowly Memory: WNL  Previous Home Environment:  Living Arrangements: Spouse/significant other Support Systems: Spouse/significant other;Children Assistance Needed: will need at least supervision after discharge Do you have any problems obtaining your medications?: No Type of Residence: Private residence Home Care Services:  (unknown at this time) Patient expects to be discharged to:: home Expected Discharge Date:  (to be determined)  Discharge Living Setting:  Plans for Discharge Living Setting: Patient's home;Lives with (comment);House (husband) Discharge Living Setting Number of Levels: 1 Discharge Living Setting Number of Steps: ramp Discharge Living Setting is Bedroom on Main Floor?: Yes Discharge Living Setting is Bathroom on Main Floor?: Yes  Social/Family/Support Systems:   Patient Roles: Spouse;Parent Anticipated Caregiver: husband and family Anticipated Caregiver's Contact Information: Joory Alwood [phone] Ability/Limitations of Caregiver: can assist Caregiver Availability: 24/7 Discharge Plan Discussed with Primary Caregiver: Yes Is Caregiver In Agreement with Plan?: Yes Does Caregiver/Family have Issues with Lodging/Transportation while Pt is in Rehab?: No  Goals/Additional Needs:  Patient/Family Goal for Rehab: to be as independent as possible Cultural Considerations: [employer] Dietary Needs: Lactose intolerant Equipment  Needs: to be determined Pt/Family Agrees to Admission and willing to participate: Yes Program Orientation Provided & Reviewed with Pt/Caregiver Including Roles  & Responsibilities: Yes  Preadmission Screen Completed By:  Bing Neighbors, 01/05/2011 10:05 AM  Preadmission Screen Completed by Caprice Kluver, Time/Date, 10:25am on 01/05/11.    Discussed status with Dr. Letta Pate on 01/05/11 at 0800 (time/date) and received telephone approval for admission today.  Admission Coordinator:  Bing Neighbors, time 10:27am/Date12/20/12 Patient was not approved by insurance until 01/06/11 @ 1330. No changes to pre admission screen. Keyia Moretto,RN  Co-signed by:    [name]

## 2011-01-06 ENCOUNTER — Inpatient Hospital Stay (HOSPITAL_COMMUNITY)
Admission: RE | Admit: 2011-01-06 | Discharge: 2011-01-09 | DRG: 945 | Disposition: A | Discharge status: Home Health | Payer: Medicare PPO | Source: Ambulatory Visit | Attending: Physical Medicine & Rehabilitation | Admitting: Physical Medicine & Rehabilitation

## 2011-01-06 ENCOUNTER — Other Ambulatory Visit (HOSPITAL_COMMUNITY): Payer: Self-pay | Admitting: Physical Medicine and Rehabilitation

## 2011-01-06 DIAGNOSIS — I10 Essential (primary) hypertension: Secondary | ICD-10-CM | POA: Insufficient documentation

## 2011-01-06 DIAGNOSIS — K2970 Gastritis, unspecified, without bleeding: Secondary | ICD-10-CM

## 2011-01-06 DIAGNOSIS — G2581 Restless legs syndrome: Secondary | ICD-10-CM

## 2011-01-06 DIAGNOSIS — I509 Heart failure, unspecified: Secondary | ICD-10-CM

## 2011-01-06 DIAGNOSIS — K209 Esophagitis, unspecified without bleeding: Secondary | ICD-10-CM

## 2011-01-06 DIAGNOSIS — I6200 Nontraumatic subdural hemorrhage, unspecified: Secondary | ICD-10-CM

## 2011-01-06 DIAGNOSIS — Z9889 Other specified postprocedural states: Secondary | ICD-10-CM

## 2011-01-06 DIAGNOSIS — E875 Hyperkalemia: Secondary | ICD-10-CM

## 2011-01-06 DIAGNOSIS — E785 Hyperlipidemia, unspecified: Secondary | ICD-10-CM

## 2011-01-06 DIAGNOSIS — Z95810 Presence of automatic (implantable) cardiac defibrillator: Secondary | ICD-10-CM

## 2011-01-06 DIAGNOSIS — Z79899 Other long term (current) drug therapy: Secondary | ICD-10-CM

## 2011-01-06 DIAGNOSIS — Z5189 Encounter for other specified aftercare: Principal | ICD-10-CM

## 2011-01-06 DIAGNOSIS — Z7901 Long term (current) use of anticoagulants: Secondary | ICD-10-CM

## 2011-01-06 DIAGNOSIS — J449 Chronic obstructive pulmonary disease, unspecified: Secondary | ICD-10-CM

## 2011-01-06 DIAGNOSIS — G9340 Encephalopathy, unspecified: Secondary | ICD-10-CM

## 2011-01-06 DIAGNOSIS — J4489 Other specified chronic obstructive pulmonary disease: Secondary | ICD-10-CM

## 2011-01-06 DIAGNOSIS — I428 Other cardiomyopathies: Secondary | ICD-10-CM

## 2011-01-06 DIAGNOSIS — G8190 Hemiplegia, unspecified affecting unspecified side: Secondary | ICD-10-CM

## 2011-01-06 DIAGNOSIS — D62 Acute posthemorrhagic anemia: Secondary | ICD-10-CM

## 2011-01-06 DIAGNOSIS — R569 Unspecified convulsions: Secondary | ICD-10-CM

## 2011-01-06 DIAGNOSIS — N390 Urinary tract infection, site not specified: Secondary | ICD-10-CM

## 2011-01-06 DIAGNOSIS — E870 Hyperosmolality and hypernatremia: Secondary | ICD-10-CM

## 2011-01-06 DIAGNOSIS — E119 Type 2 diabetes mellitus without complications: Secondary | ICD-10-CM

## 2011-01-06 DIAGNOSIS — E559 Vitamin D deficiency, unspecified: Secondary | ICD-10-CM

## 2011-01-06 DIAGNOSIS — K2990 Gastroduodenitis, unspecified, without bleeding: Secondary | ICD-10-CM

## 2011-01-06 DIAGNOSIS — I4891 Unspecified atrial fibrillation: Secondary | ICD-10-CM

## 2011-01-06 DIAGNOSIS — B952 Enterococcus as the cause of diseases classified elsewhere: Secondary | ICD-10-CM

## 2011-01-06 MED ORDER — INSULIN ASPART 100 UNIT/ML ~~LOC~~ SOLN
0.0000 [IU] | Freq: Three times a day (TID) | SUBCUTANEOUS | Status: DC
  Administered 2011-01-07 – 2011-01-08 (×3): 1 [IU] via SUBCUTANEOUS
  Filled 2011-01-06: qty 3

## 2011-01-06 MED ORDER — NYSTATIN 100000 UNIT/GM EX POWD
Freq: Two times a day (BID) | CUTANEOUS | Status: DC
  Administered 2011-01-06 – 2011-01-09 (×6): via TOPICAL
  Filled 2011-01-06: qty 15

## 2011-01-06 MED ORDER — TRAZODONE HCL 50 MG PO TABS: 25.0000 mg | ORAL_TABLET | Freq: Every evening | ORAL | Status: DC | PRN

## 2011-01-06 MED ORDER — PANTOPRAZOLE SODIUM 40 MG PO TBEC
40.0000 mg | DELAYED_RELEASE_TABLET | Freq: Two times a day (BID) | ORAL | Status: DC
  Administered 2011-01-07 – 2011-01-09 (×6): 40 mg via ORAL
  Filled 2011-01-06 (×6): qty 1

## 2011-01-06 MED ORDER — PANTOPRAZOLE SODIUM 40 MG PO TBEC
40.0000 mg | DELAYED_RELEASE_TABLET | Freq: Every day | ORAL | Status: DC
  Administered 2011-01-08 – 2011-01-09 (×2): 40 mg via ORAL
  Filled 2011-01-06: qty 1

## 2011-01-06 MED ORDER — ROPINIROLE HCL 0.5 MG PO TABS
0.5000 mg | ORAL_TABLET | Freq: Every day | ORAL | Status: DC
  Administered 2011-01-06 – 2011-01-08 (×3): 0.5 mg via ORAL
  Filled 2011-01-06 (×4): qty 1

## 2011-01-06 MED ORDER — FERROUS SULFATE 325 (65 FE) MG PO TABS
325.0000 mg | ORAL_TABLET | Freq: Two times a day (BID) | ORAL | Status: DC
  Administered 2011-01-07 – 2011-01-09 (×5): 325 mg via ORAL
  Filled 2011-01-06 (×8): qty 1

## 2011-01-06 MED ORDER — PROMETHAZINE HCL 25 MG/ML IJ SOLN: 12.5000 mg | Freq: Four times a day (QID) | INTRAMUSCULAR | Status: DC | PRN

## 2011-01-06 MED ORDER — LINAGLIPTIN 5 MG PO TABS
5.0000 mg | ORAL_TABLET | Freq: Every day | ORAL | Status: DC
  Administered 2011-01-06 – 2011-01-09 (×4): 5 mg via ORAL
  Filled 2011-01-06 (×5): qty 1

## 2011-01-06 MED ORDER — LEVETIRACETAM 750 MG PO TABS
750.0000 mg | ORAL_TABLET | Freq: Two times a day (BID) | ORAL | Status: DC
  Administered 2011-01-06 – 2011-01-09 (×6): 750 mg via ORAL
  Filled 2011-01-06 (×8): qty 1

## 2011-01-06 MED ORDER — BISACODYL 10 MG RE SUPP: 10.0000 mg | Freq: Every day | RECTAL | Status: DC | PRN

## 2011-01-06 MED ORDER — ACETAMINOPHEN 325 MG PO TABS: 325.0000 mg | ORAL_TABLET | ORAL | Status: DC | PRN

## 2011-01-06 MED ORDER — PROMETHAZINE HCL 12.5 MG PO TABS: 12.5000 mg | ORAL_TABLET | Freq: Four times a day (QID) | ORAL | Status: DC | PRN

## 2011-01-06 MED ORDER — VITAMIN D (ERGOCALCIFEROL) 1.25 MG (50000 UNIT) PO CAPS
50000.0000 [IU] | ORAL_CAPSULE | ORAL | Status: DC
  Administered 2011-01-06: 50000 [IU] via ORAL
  Filled 2011-01-06: qty 1

## 2011-01-06 MED ORDER — DIPHENHYDRAMINE HCL 12.5 MG/5ML PO ELIX: 12.5000 mg | ORAL_SOLUTION | Freq: Four times a day (QID) | ORAL | Status: DC | PRN

## 2011-01-06 MED ORDER — SIMVASTATIN 40 MG PO TABS
40.0000 mg | ORAL_TABLET | Freq: Every day | ORAL | Status: DC
  Administered 2011-01-07 – 2011-01-08 (×2): 40 mg via ORAL
  Filled 2011-01-06 (×3): qty 1

## 2011-01-06 MED ORDER — PROMETHAZINE HCL 12.5 MG RE SUPP: 12.5000 mg | Freq: Four times a day (QID) | RECTAL | Status: DC | PRN

## 2011-01-06 MED ORDER — INSULIN ASPART 100 UNIT/ML ~~LOC~~ SOLN: 0.0000 [IU] | Freq: Every day | SUBCUTANEOUS | Status: DC

## 2011-01-06 MED ORDER — ALUM & MAG HYDROXIDE-SIMETH 400-400-40 MG/5ML PO SUSP
30.0000 mL | ORAL | Status: DC | PRN
  Filled 2011-01-06: qty 30

## 2011-01-06 MED ORDER — BENEPROTEIN PO POWD
1.0000 | Freq: Three times a day (TID) | ORAL | Status: DC
  Administered 2011-01-07 – 2011-01-09 (×7): 6 g via ORAL
  Filled 2011-01-06: qty 227

## 2011-01-06 MED ORDER — LISINOPRIL 10 MG PO TABS
10.0000 mg | ORAL_TABLET | Freq: Every day | ORAL | Status: DC
  Administered 2011-01-06: 10 mg via ORAL
  Filled 2011-01-06 (×3): qty 1

## 2011-01-06 MED ORDER — TRAMADOL HCL 50 MG PO TABS: 50.0000 mg | ORAL_TABLET | Freq: Four times a day (QID) | ORAL | Status: DC | PRN

## 2011-01-06 MED ORDER — POLYETHYLENE GLYCOL 3350 17 G PO PACK
17.0000 g | PACK | Freq: Every day | ORAL | Status: DC | PRN
  Filled 2011-01-06: qty 1

## 2011-01-06 MED ORDER — FLEET ENEMA 7-19 GM/118ML RE ENEM
1.0000 | ENEMA | Freq: Once | RECTAL | Status: AC | PRN
  Filled 2011-01-06: qty 1

## 2011-01-06 MED ORDER — CARVEDILOL 25 MG PO TABS
25.0000 mg | ORAL_TABLET | Freq: Two times a day (BID) | ORAL | Status: DC
  Administered 2011-01-06 – 2011-01-09 (×6): 25 mg via ORAL
  Filled 2011-01-06 (×8): qty 1

## 2011-01-06 MED ORDER — GUAIFENESIN-DM 100-10 MG/5ML PO SYRP: 5.0000 mL | ORAL_SOLUTION | Freq: Four times a day (QID) | ORAL | Status: DC | PRN
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
## 2011-01-07 LAB — URINALYSIS, ROUTINE W REFLEX MICROSCOPIC
Glucose, UA: NEGATIVE mg/dL
Protein, ur: 100 mg/dL — AB
pH: 6 (ref 5.0–8.0)

## 2011-01-07 LAB — URINE MICROSCOPIC-ADD ON

## 2011-01-07 LAB — GLUCOSE, CAPILLARY

## 2011-01-07 LAB — MRSA PCR SCREENING: MRSA by PCR: NEGATIVE

## 2011-01-07 MED ORDER — LISINOPRIL 10 MG PO TABS
10.0000 mg | ORAL_TABLET | Freq: Two times a day (BID) | ORAL | Status: DC
  Administered 2011-01-07 – 2011-01-09 (×5): 10 mg via ORAL
  Filled 2011-01-07 (×6): qty 1

## 2011-01-07 MED ORDER — CIPROFLOXACIN HCL 500 MG PO TABS
500.0000 mg | ORAL_TABLET | Freq: Two times a day (BID) | ORAL | Status: DC
  Administered 2011-01-07 – 2011-01-09 (×4): 500 mg via ORAL
  Filled 2011-01-07 (×6): qty 1

## 2011-01-07 NOTE — Progress Notes (Signed)
Assessment & Plan  Speech Language Pathology Assessment and Plan  Patient Details  Name: [name] MRN: [id number] Date of Birth: 06/28/1940  SLP Diagnosis: cognitive deficits Rehab Potential: Good ELOS:  5-7 days  Time: 1330-1430 Time Calculation (min): 60 min  Assessment & Plan Clinical Impression: Mrs Heltzel is a 70 year old female with history of afib on chronic coumadin, COPD, DM, CHF, HLP; admitted to [facility] on 12/23/10 after being found down by her husband. CT of brain with multiple right extraaxial intracranial hemorrhages and INR >20. Patient with seizure episode, treated with FFP, intubated and transferred to [facility]. Repeat CT with slightly enlarged hemorrhage and patient taken to OR for right crani for evacuation of epidural hematoma. Post op, patient with left hemiparesis, left field neglect with inability to visually tract past midline. Hospital course complicated by hypernatremia and post op seizures 12/27/10. EEG done with generalized slowing consistent with encephalopathy of nonspecific etiology. Patient started ton keppra for seizure prophylaxis. Patient has had complaints of abdominal pain and EGD with evidence of gastritis and esophagitis. Started on PPI bid for 8 weeks. Therapies ongoing with recommendations for CIR level therapies for progression. Patient transferred to CIR on 01/06/2011 and upon evaluation today presents with cognitive deficits characterized by decreased recall of new information, awareness, problem solving, self monitoring and correcting.  As a result, this impacts her ability to safely complete BADLs.  Patient demonstrates decreased safety awareness "I can walk to the bathroom myself if you give me a walker" and difficulty completing complex tasks such as money management.   Short Term Goals: same as long term goals secondary to length of stay.  SLP - End of Session Activity Tolerance: Tolerates 30+ min activity with multiple rests Patient left: in bed;with  call bell in reach;with bed alarm set Assessment Rehab Potential: Good Barriers to Discharge: None Therapy Diagnosis: Cognitive Impairments  Precautions/Restrictions  Precautions Precautions: Fall Required Braces or Orthoses: No Restrictions Weight Bearing Restrictions: No General  Chart Reviewed: Yes Additional Pertinent History: Patient transferred to CIR 01/06/11 Pain Pain Assessment Pain Assessment: No/denies pain Pain Score: 0-No pain Prior Functioning Cognitive/Linguistic Baseline: Within functional limits Type of Home: House Lives With: Spouse Receives Help From: Family;Other (Comment) (son Saintclair Halsted) and daughter-in-law next door) Vocation: Other (comment) (cleans offices/houses) Cognition Overall Cognitive Status: Impaired Arousal/Alertness: Awake/alert Orientation Level: Oriented X4 Attention: Alternating Alternating Attention: Impaired Alternating Attention Impairment: Functional complex;Verbal complex Memory: Impaired (with function) Memory Impairment: Other (comment) (and then use of new information impaired) Awareness: Impaired Awareness Impairment: Emergent impairment Problem Solving: Impaired Problem Solving Impairment: Functional basic (minimal assist) Executive Function: Self Monitoring;Self Correcting Self Monitoring: Impaired Self Monitoring Impairment: Functional complex Self Correcting: Impaired Self Correcting Impairment: Functional complex Safety/Judgment: Impaired Comments: Presents clinically as a Rancho 6-7; however no formal TBI diagnosis at this time Comprehension Auditory Comprehension Overall Auditory Comprehension: Appears within functional limits for tasks assessed Reading Comprehension Reading Status: Within funtional limits (difficulty determined to be due to no glasses) Expression Expression Primary Mode of Expression: Verbal Verbal Expression Overall Verbal Expression: Appears within functional limits for tasks  assessed Written Expression Dominant Hand: Right Written Expression: Within Functional Limits (no left inattention observed) Oral/Motor Oral Motor/Sensory Function Overall Oral Motor/Sensory Function: Appears within functional limits for tasks assessed   Recommendations for other services: None  Discharge Criteria: Patient will be discharged from SLP if patient refuses treatment 3 consecutive times without medical reason, if treatment goals not met, if there is a change in medical status, if  patient makes no progress towards goals or if patient is discharged from hospital.  The above assessment, treatment plan, treatment alternatives and goals were discussed and mutually agreed upon: by patient, family not present  Carmelia Roller., [place] 01/07/2011 3:37 PM
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
## 2011-01-07 NOTE — Progress Notes (Signed)
Physical Therapy Assessment and Plan  Patient Details  Name: [name] MRN: [id number] Date of Birth: 08/09/1940  PT Diagnosis: Difficulty walking and Muscle weakness Rehab Potential: Good ELOS:   5-7 days  Today's Date: 01/07/2011 Time: 1100-1155 Time Calculation (min): 55 min  Clinical Impression:   [name] is a 70 year old female with history of afib on chronic coumadin; admitted to [facility] on 12/23/10 after found down by husband. CT of brain with multiple right extraaxial intracranial hemorrhages and INR >20. Patient with seizure episode, treated with FFP, intubated and transferred to [facility]. Repeat CT with slightly enlarged hemorrhage and pt taken to OR for right crani for evacuation of epidural hematoma. Post op, patient with left hemiparesis, left field neglect with inability to visually tract past midline. Hospital course complicated by hypernatremia and post op seizures 12/11. EEG done with generalized slowing consistent with encephalopathy of nonspecific etiology. Patient started on keppra for seizure prophylaxis. Patient has had complaints of abdominal pain and EGD with evidence of gastritis and esophagitis. Started on PPI bid for 8 weeks. Therapies ongoing with recommendations for CIR level therapies for progression and admitted to CIR on 01/06/2011. Patient currently requires min with mobility secondary to muscle weakness.  Prior to hospitalization, patient was Independent with mobility and lived with Spouse in a House.  Home access is  Ramped entrance. Patient will benefit from skilled PT intervention to maximize safe functional mobility, minimize fall risk and decrease caregiver burden for planned discharge home with intermittent assist.  Anticipate patient will not need PT follow up at discharge.  PT - End of Session Activity Tolerance: Tolerates 30+ min activity with multiple rests Endurance Deficit: No PT Assessment Rehab Potential: Good Barriers to Discharge: None PT Plan PT  Frequency: 1-2 X/day, 60-90 minutes PT Treatment/Interventions: Ambulation/gait training;Balance/vestibular training;DME/adaptive equipment instruction;Functional mobility training;Patient/family education;Therapeutic Activities;Therapeutic Exercise;UE/LE Strength taining/ROM;UE/LE Coordination activities PT Recommendation Follow Up Recommendations: None Equipment Recommended: Tub/shower bench  Precautions/Restrictions Precautions Precautions: Fall Required Braces or Orthoses: No Restrictions Weight Bearing Restrictions: No General @[id number] [phone])  Pain Pain Assessment Pain Assessment: No/denies pain Pain Score: 0-No pain Home Living/Prior Functioning Home Living Lives With: Spouse Receives Help From: Family;Other (Comment) (son Saintclair Halsted) and daughter-in-law next door) Type of Home: House Home Layout: One level Home Access: Ramped entrance Bathroom Shower/Tub: Chiropodist: Standard Bathroom Accessibility: Yes How Accessible: Accessible via wheelchair [place]: Grab bars around toilet;Grab bars in shower;Straight cane Prior Function Level of Independence: Independent with basic ADLs;Independent with homemaking with ambulation;Independent with gait;Independent with transfers Able to Take Stairs?: Yes Driving: Yes Vocation: Other (comment) (cleans offices/houses) Vision/Perception  Vision - History Baseline Vision: No visual deficits Patient Visual Report: Blurring of vision Vision - Assessment Eye Alignment: Within Functional Limits Vision Assessment: Vision tested Ocular Range of Motion: Within Functional Limits Alignment/Gaze Preference: Within Defined Limits (slight Lt neglect, TBA further) Tracking/Visual Pursuits: Decreased smoothness of horizontal tracking;Decreased smoothness of vertical tracking;Requires cues, head turns, or add eye shifts to track Convergence: Within functional limits Perception Perception:  Within Functional Limits Praxis Praxis: Intact  Cognition   Sensation Sensation Light Touch: Appears Intact Stereognosis: Appears Intact Hot/Cold: Appears Intact Proprioception: Appears Intact Coordination Gross Motor Movements are Fluid and Coordinated: Yes Fine Motor Movements are Fluid and Coordinated: Not tested Motor  Motor Motor: Ataxia Motor - Skilled Clinical Observations: milkd ataxia with gait, foot placement, also mild weakness in Left LE  Mobility Bed Mobility Bed Mobility: Yes Rolling Right: 5: Supervision Rolling Right Details: Verbal  cues for technique;Verbal cues for precautions/safety Rolling Left: 5: Supervision Rolling Left Details: Verbal cues for technique;Verbal cues for precautions/safety Right Sidelying to Sit: 5: Supervision Right Sidelying to Sit Details: Verbal cues for sequencing;Verbal cues for precautions/safety Left Sidelying to Sit: 5: Supervision Left Sidelying to Sit Details: Verbal cues for sequencing;Verbal cues for precautions/safety Supine to Sit: 5: Supervision Supine to Sit Details: Verbal cues for sequencing;Verbal cues for precautions/safety Sitting - Scoot to Edge of Bed: 5: Supervision Sitting - Scoot to [employer] of Bed Details: Verbal cues for sequencing;Verbal cues for precautions/safety Scooting to [facility]: 4: Min assist Scooting to [facility] Details: Verbal cues for sequencing;Verbal cues for precautions/safety Transfers Transfers: Yes Sit to Stand: 4: Min assist (mild posteriuor lean) Sit to Stand Details: Tactile cues for sequencing;Verbal cues for sequencing;Verbal cues for precautions/safety Stand to Sit: 5: Supervision Stand to Sit Details (indicate cue type and reason): Verbal cues for sequencing;Verbal cues for precautions/safety Stand Pivot Transfers: 4: Min assist Stand Pivot Transfer Details: Tactile cues for placement;Verbal cues for precautions/safety Locomotion  Ambulation Ambulation: Yes Ambulation/Gait Assistance: 4: Min  assist Assistive device: 1 person hand held assist Ambulation/Gait Assistance Details (indicate cue type and reason): occaisional wide BOS with min-A to correct posture and stability Gait Gait: Yes Gait Pattern: Decreased step length - right;Decreased step length - left Stairs / Additional Locomotion Stairs: No Ramp: Not tested (comment)  Trunk/Postural Assessment  Cervical Assessment Cervical Assessment: Within Functional Limits Thoracic Assessment Thoracic Assessment: Within Functional Limits Lumbar Assessment Lumbar Assessment: Within Functional Limits Postural Control Postural Control: Within Functional Limits  Balance Balance Balance Assessed: Yes Static Sitting Balance Static Sitting - Balance Support: No upper extremity supported Static Sitting - Level of Assistance: 7: Independent Dynamic Sitting Balance Dynamic Sitting - Balance Support: No upper extremity supported Dynamic Sitting - Level of Assistance: 6: Modified independent (Device/Increase time) Dynamic Sitting - Balance Activities: Lateral lean/weight shifting;Forward lean/weight shifting Static Standing Balance Static Standing - Balance Support: Bilateral upper extremity supported Static Standing - Level of Assistance: 5: Stand by assistance Dynamic Standing Balance Dynamic Standing - Balance Support: No upper extremity supported Dynamic Standing - Level of Assistance: 4: Min assist Dynamic Standing - Balance Activities: Lateral lean/weight shifting;Forward lean/weight shifting;Reaching for objects Extremity Assessment  RUE Assessment RUE Assessment: Exceptions to [facility] RUE AROM (degrees) RUE Overall AROM Comments: WFL RUE Strength RUE Overall Strength: Within Functional Limits for tasks performed RUE Overall Strength Comments: 4/5 Gross Grasp: Functional LUE Assessment LUE Assessment: Exceptions to WFL LUE AROM (degrees) Overall AROM Left Upper Extremity: Within functional limits for tasks assessed LUE  Strength LUE Overall Strength: Within Functional Limits for tasks assessed LUE Overall Strength Comments: 3+/5 Gross Grasp: Functional RLE Assessment RLE Assessment: Within Functional Limits LLE Strength LLE Overall Strength: Deficits Left Hip Flexion: 4/5 Left Hip ABduction: 3+/5 Left Hip ADduction: 3+/5 Left Knee Flexion: 4/5 Left Knee Extension: 4/5 Left Ankle Dorsiflexion: 3+/5 Left Ankle Plantar Flexion: 3+/5 Left Ankle Inversion: 3+/5 Left Ankle Eversion: 3+/5  Recommendations for other services: None  Discharge Criteria: Patient will be discharged from PT if patient refuses treatment 3 consecutive times without medical reason, if treatment goals not met, if there is a change in medical status, if patient makes no progress towards goals or if patient is discharged from hospital.  The above assessment, treatment plan, treatment alternatives and goals were discussed and mutually agreed upon: by patient  Sylvester Harder 01/07/2011, 12:43 PM

## 2011-01-07 NOTE — Progress Notes (Signed)
Physical Therapy Note  Patient Details  Name: [name] MRN: [id number] Date of Birth: [birth year] Today's Date: 01/07/2011 Time: 1530-1600 (30')  Pain; denies pain at this time  Gait Training (15'); gait using RW x 150' with Supervised assist and verbal cues for safety in positioning.  Gait x 150' without assistive device with close SBA with patient reaching ocaisionally for wall/furniture/rail in hallway for maintaining steadiness.  No LOB during gait but gait showed deviations from straight line.  W/C training/parts management; (15')  Patient instructed with verbal and demonstration for parts management and needed verbal and tactile cues to safely apply brakes and to remove/replace foot rests on w/c.  Patient able to maneuver/propel w/c in controlled environment x 120'.  By end of session, patient needing only Supervised assist.  Individual Treatment Session   Sylvester Harder 01/07/2011, 4:07 PM

## 2011-01-07 NOTE — Progress Notes (Signed)
Pt alert /oriented x 3, short term memory deficits noted, bed alarm, quick release in use, up with one asst to stand pivot, unsteady on feet, continent of bowel/bladder, last BM  21 rst, sacral/buttocks with red, using barrier cream/microguard powder to site, see FIM for further assessment, will continue with plan of care

## 2011-01-07 NOTE — Progress Notes (Signed)
Pt arrived this evening from [facility]. Pt alert and oriented. Pt oriented to room and to unit. Discussed safety plan and therapy schedule.

## 2011-01-07 NOTE — Progress Notes (Signed)
Overall Plan of Care [facility]) Patient Details Name: [name] MRN: [id number] DOB: 10-May-1940  Diagnosis:  TBI  Primary Diagnosis:    <principal problem not specified> Co-morbidities: Afib, CAD, ABLA, Sz  Functional Problem List  Patient demonstrates impairments in the following areas: Balance, Cognition and Motor  Basic ADL's: grooming, bathing, dressing and toileting Advanced ADL's: simple meal preparation, laundry and light housekeeping  Transfers:  toilet and tub/shower Locomotion:  ambulation  Additional Impairments:  Other  Anticipated Outcomes Item Anticipated Outcome  Eating/Swallowing    Basic self-care  Mod I/supervision  Tolieting  Supervision  Bowel/Bladder    Transfers  Supervision  Locomotion  Gait with/without AD x 150' with S/Mod-I  Communication      Cognition  Supervision  Pain    Safety/Judgment    Other     Therapy Plan: PT Frequency: 1-2 X/day, 60-90 minutes OT Frequency: 1-2 X/day, 60-90 minutes  SLP frequency: 1-2 x/day, 60-90 minutes, 5 out of 7 days  Team Interventions: Item RN PT OT SLP SW TR Other  Self Care/Advanced ADL Retraining   x      Neuromuscular Re-Education   x      Therapeutic Activities  x x x     UE/LE Strength Training/ROM  x x      UE/LE Coordination Activities  x x      Visual/Perceptual Remediation/Compensation   x      DME/Adaptive Equipment Instruction  x x      Therapeutic Exercise  x x      Balance/Vestibular Training  x x      Patient/Family Education  x x x     Cognitive Remediation/Compensation   x x     Functional Mobility Training  x x      Ambulation/Gait Training  x       IT trainer  x       Wheelchair Propulsion/Positioning  x       Writer         Bladder Management         Bowel Management         Disease Management/Prevention         Pain  Management         Medication Management         Skin Care/Wound Management         Splinting/Orthotics         Discharge Planning         Psychosocial Support                            Team Discharge Planning: Destination:  Home Projected Follow-up:  SLP out patient, OT TBD Projected Equipment Needs:  Tub Bench Patient/family involved in discharge planning:  Yes  MD ELOS: LESS THAN 5 DAY  Medical Rehab Prognosis:  Excellent Assessment: patient was admitted to for CIR therapies with PT,OT, SLP addressing functional mobility, NMR, balance, safety, family ed, ADL's, lower and upper extremity strength and coordination, family and patient safety. Goals are all modified independent to supervision.  Therapy provided at least 3 hours a day at least 5 days per week.

## 2011-01-08 LAB — GLUCOSE, CAPILLARY
Glucose-Capillary: 95 mg/dL (ref 70–99)
Glucose-Capillary: 125 mg/dL — ABNORMAL HIGH (ref 70–99)

## 2011-01-08 NOTE — Progress Notes (Signed)
Physical Therapy Session Note  Patient Details  Name: Seriyah Tonga MRN: [id number] Date of Birth: June 24, 1940  Today's Date: 01/08/2011 Time: [id number] Time Calculation (min):34 min  Precautions: Precautions Precautions: Fall Required Braces or Orthoses: No Restrictions Weight Bearing Restrictions: No  Short Term Goals: PT Short Term Goal 1: Patient will be able to perform all mobility and transfers, including car transfers with Supervision assist PT Short Term Goal 2: Patient will be able to ambulate in community and household environments with least restrictive assistive device x 150' Supervised Assist   Skilled Therapeutic Interventions/Progress Updates:    Mrs. Zellman was seen this afternoon for high level dynamic standing balance activities without an assistive device.  She toileted with close S.  She was able to amb weaving in and out of cones with cgA.  She was able to pick up the cones with cgA.  She was able to perform ring toss in standing and then pick up the rings with close S.  She was able to bounce/catch a ball while walking with close S. Nustep on work level 3 x 3 min.  Mrs. Barbero is progressing nicely toward goals and, with family training, should be safe to return home.  Her family was unavailable for training today, but are scheduled to arrive later this evening.  Plan is to train family 01/09/11.        Therapy/Group: Individual Therapy  Brendalee Matthies,TAMMY 01/08/2011, 4:01 PM

## 2011-01-08 NOTE — Progress Notes (Signed)
Pt alert/oriented x 3, calls appropriately and makes needs known , up with one assist to stand/pivot, continent of bowel/bladder, last BM 12/21, bottom with red to site, barrier cream in use, will continue with plan of care
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
## 2011-01-08 NOTE — Progress Notes (Signed)
Physical Therapy Session Note  Patient Details  Name: [name] MRN: [id number] Date of Birth: Sep 19, 1940  Today's Date: 01/08/2011 Time: 1002- Time Calculation (min):  Precautions: Precautions Precautions: Fall Required Braces or Orthoses: No Restrictions Weight Bearing Restrictions: No  Short Term Goals: PT Short Term Goal 1: Patient will be able to perform all mobility and transfers, including car transfers with Supervision assist PT Short Term Goal 2: Patient will be able to ambulate in community and household environments with least restrictive assistive device x 150' Supervised Assist   Skilled Therapeutic Interventions/Progress Updates:     [name] was seen today for gait training and high level balance activities.  She reports her pain is 0/10.  She was mod I with bed mobility.  She was able to perform a stand pivot transfer with close S.  She was able to ascend/descend 12 steps with B rails with cg A alternating steps.  Performed side stepping x 20' to the left and right with B UE support, 1 UE support and with no UE support with cgA.  Performed step ups to the front and to the side 2 x 10 reps with cgA.  Noted increased LOB with stance on the left, but patient able to self correct.  Kicked ball against wall 2 minx 2 reps to continue to work on single limb stance.  Tended to lose balance posteriorly and c/o fatigue with this activity.  Ascended/descended 8" curbs with min A.  When asked to turn head from side to side while walking, noted increase in balance loss and decreased fluidity with gait requiring min A for balance.   Therapy/Group: Individual Therapy  Ajax Schroll,TAMMY 01/08/2011, 10:50 AM

## 2011-01-08 NOTE — Progress Notes (Signed)
Occuapational Therapy Note  Patient Details  Name: [name] MRN: [id number] Date of Birth: 1940-02-12 Today's Date: 01/08/2011  Time In:  1110  Time Out:  1151  Individual session.  Patient denies any pain.  Treatment focused on functional ambulation, dynamic standing balance, head turns with ambulation, negotiating uneven surfaces, tub transfer into tub/shower combo, increasing activity tolerance.  Patient will benefit from shower seat at home and is aware that insurance will not pay for equipment.  Patient in agreement with shower seat.  No other OT equipment is indicated at this time.    Quay Burow 01/08/2011, 11:52 AM

## 2011-01-08 NOTE — Progress Notes (Signed)
Physical Therapy Note  Patient Details  Name: [name] MRN: [id number] Date of Birth: [birth year] Today's Date: 01/08/2011  Time In:  0800 Time Out: 0859   Individual Session. Patient denies pain.  Treatment focus on ADL retraining at shower level with emphasis on functional ambulation with and without a walker, toilet transfers, shower transfers, standing balance, activity endurance.  Patient able to ambulate approximately 150 feet with a  Rolling walker and supervision.  Patient has difficulty with uneven surfaces, head turns and sudden starts and stops.  Completed BERG balance assessment - patient with a score of 41 out of 56 indicating she is a very high fall risk.  Patient will need supervision for mobility upon discharge.  Patient also has difficulty with balance with narrow base of support, alternate stepping, and single leg stance.   Quay Burow 01/08/2011, 10:26 AM

## 2011-01-09 LAB — COMPREHENSIVE METABOLIC PANEL
BUN: 7 mg/dL (ref 6–23)
CO2: 26 mEq/L (ref 19–32)
Calcium: 8.7 mg/dL (ref 8.4–10.5)
Creatinine, Ser: 0.67 mg/dL (ref 0.50–1.10)
GFR calc Af Amer: >90 mL/min (ref >90)
GFR calc non Af Amer: 86 mL/min — ABNORMAL LOW (ref >90)
Glucose, Bld: 98 mg/dL (ref 70–99)
Sodium: 142 mEq/L (ref 135–145)
Total Protein: 5.7 g/dL — ABNORMAL LOW (ref 6.0–8.3)

## 2011-01-09 LAB — DIFFERENTIAL
Basophils Absolute: 0 10*3/uL (ref 0.0–0.1)
Eosinophils Relative: 9 % — ABNORMAL HIGH (ref 0–5)
Lymphocytes Relative: 31 % (ref 12–46)
Lymphs Abs: 1 10*3/uL (ref 0.7–4.0)
Neutro Abs: 1.6 10*3/uL — ABNORMAL LOW (ref 1.7–7.7)
Neutrophils Relative %: 48 % (ref 43–77)

## 2011-01-09 LAB — GLUCOSE, CAPILLARY
Glucose-Capillary: 89 mg/dL (ref 70–99)
Glucose-Capillary: 109 mg/dL — ABNORMAL HIGH (ref 70–99)

## 2011-01-09 LAB — CBC
HCT: 28.5 % — ABNORMAL LOW (ref 36.0–46.0)
Hemoglobin: 9.3 g/dL — ABNORMAL LOW (ref 12.0–15.0)
MCH: 28.7 pg (ref 26.0–34.0)
MCHC: 32.6 g/dL (ref 30.0–36.0)
MCV: 88 fL (ref 78.0–100.0)
RBC: 3.24 MIL/uL — ABNORMAL LOW (ref 3.87–5.11)

## 2011-01-09 LAB — URINE CULTURE
Colony Count: >=100000
Culture  Setup Time: 201212222155

## 2011-01-09 MED ORDER — AMOXICILLIN 250 MG PO CAPS: 250.0000 mg | ORAL_CAPSULE | Freq: Three times a day (TID) | ORAL | Status: AC

## 2011-01-09 MED ORDER — FERROUS SULFATE 325 (65 FE) MG PO TABS: 325.0000 mg | ORAL_TABLET | Freq: Two times a day (BID) | ORAL | Status: DC

## 2011-01-09 MED ORDER — LEVETIRACETAM 750 MG PO TABS: 750.0000 mg | ORAL_TABLET | Freq: Two times a day (BID) | ORAL | Status: DC

## 2011-01-09 MED ORDER — VITAMIN D (ERGOCALCIFEROL) 1.25 MG (50000 UNIT) PO CAPS: 50000.0000 [IU] | ORAL_CAPSULE | ORAL | Status: DC

## 2011-01-09 MED ORDER — POTASSIUM CHLORIDE CRYS ER 10 MEQ PO TBCR: 20.0000 meq | EXTENDED_RELEASE_TABLET | Freq: Two times a day (BID) | ORAL | Status: DC

## 2011-01-09 MED ORDER — POTASSIUM CHLORIDE CRYS ER 20 MEQ PO TBCR
20.0000 meq | EXTENDED_RELEASE_TABLET | Freq: Three times a day (TID) | ORAL | Status: DC
  Administered 2011-01-09: 20 meq via ORAL
  Filled 2011-01-09 (×3): qty 1

## 2011-01-09 MED ORDER — AMOXICILLIN 250 MG PO CAPS
250.0000 mg | ORAL_CAPSULE | Freq: Three times a day (TID) | ORAL | Status: DC
Start: 2011-01-09 — End: 2011-01-09
  Filled 2011-01-09 (×4): qty 1

## 2011-01-09 MED ORDER — PANTOPRAZOLE SODIUM 40 MG PO TBEC: 40.0000 mg | DELAYED_RELEASE_TABLET | Freq: Two times a day (BID) | ORAL | Status: DC

## 2011-01-09 NOTE — Progress Notes (Signed)
Occupational Therapy Discharge Summary  Patient Details  Name: Toluwanimi Decoteau MRN: [id number] Date of Birth: 08-28-1940 Today's Date: 01/09/2011  Time in/out: 11:05-12:00 Time calculation: 55 minutes  Patient has met 10 of 10 long term goals due to improved activity tolerance, improved balance and improved attention.  Patient's care partner is independent to provide the necessary physical and cognitive assistance at discharge.  Patient's husband and daughter in law present in self care session to include shower, dress, groom, safe mobility with walker and that they need be certain they were helping patient to look to her left for safety reasons if she had an occasion to neglect that side (no episode of left neglect in this session) and caregiver education to prepare for discharge home today.  Reviewed the need for 24/7 close supervision and the need to stand next to the patient any time she is on her feet or reaching/bending while seated secondary to her increased fall risk.  Caregivers verbalized understanding and had no further questions.  All report ready to go home as soon as possible!  Pain: none noted  Recommendation:  Patient will continue to received OT services in a home health setting to continue to advance functional skills in the area of ADL and iADL.  Equipment: Equipment provided: tub bench  Patient/family agrees with progress made and goals achieved: Yes  Individual session  Johnryan Sao 01/09/2011, 3:30 PM

## 2011-01-09 NOTE — Patient Care Conference (Signed)
Inpatient RehabilitationTeam Conference Note Date: 01/09/2011   Time: 1:24 PM    Patient Name: [name]      Medical Record Number: [id number]  Date of Birth: 1940/06/29 Sex: Female         Room/Bed: 4034/4034-01 Payor Info: Payor: [employer] MEDICARE  Plan: [employer] MEDICARE CHOICE PPO  Product Type: *No Product type*     Admitting Diagnosis: BHA WITH CARNI  Admit Date/Time:  01/06/2011  5:43 PM Admission Comments: No comment available   Primary Diagnosis:  <principal problem not specified> Principal Problem: <principal problem not specified>  Patient Active Problem List  Diagnoses Date Noted  . SDH (subdural hematoma) 01/06/2011  . ICD (implantable cardiac defibrillator) in place 09/01/2010  . CHF (congestive heart failure)   . PAF (paroxysmal atrial fibrillation)   . Hypertension     Expected Discharge Date: Expected Discharge Date: 01/09/11  Team Members Present: Physician: Dr. Alger Simons Case Manager Present: Jesse Fall, RN Social Worker Present: Lennart Pall, LCSW PT Present: Isabelle Course, PT OT Present: Blanchard Mane, OT SLP Present: Gunnar Fusi, SLP Nurse: Heather Roberts, RN     Current Status/Progress Goal Weekly Team Focus  Medical   potassium low, enterococcus uti---change rx to amoxil.  has made nice neuro gains  neurological stability  set up medical f/u   Bowel/Bladder             Swallow/Nutrition/ Hydration             ADL's             Mobility   supervision  supervision  family ed   Communication             Safety/Cognition/ Behavioral Observations  min A-supervision with complex problem solving  supervision  family education   Pain             Skin                *See Interdisciplinary Assessment and Plan and progress notes for long and short-term goals  Barriers to Discharge: safety    Possible Resolutions to Barriers:  supervision of family    Discharge Planning/Teaching Needs:  home with husband and other  family - all able to provide 24 hour assistance if needed      Team Discussion:  Team in agreement that pt ready for discharge today. Pt and family in agreement.  Revisions to Treatment Plan:  none   Continued Need for Acute Rehabilitation Level of Care: The patient requires daily medical management by a physician with specialized training in physical medicine and rehabilitation for the following conditions: Daily direction of a multidisciplinary physical rehabilitation program to ensure safe treatment while eliciting the highest outcome that is of practical value to the patient.: Yes Daily medical management of patient stability for increased activity during participation in an intensive rehabilitation regime.: Yes Daily analysis of laboratory values and/or radiology reports with any subsequent need for medication adjustment of medical intervention for : Neurological problems;Other  Met with pt and family to discuss d/c plan. All in agreement with d/c today. Flo Shanks 01/09/2011, 1:24 PM

## 2011-01-09 NOTE — Progress Notes (Signed)
Progress Notes  Speech Language Pathology Therapy Note  Patient Details  Name: [name] MRN: [id number] Date of Birth: 11/20/1940  Today's Date: 01/09/2011 Time: 1005-1050 Time Calculation (min): 45 min  Precautions: Precautions Precautions: Fall Required Braces or Orthoses: No Restrictions Weight Bearing Restrictions: No  Skilled Therapeutic Interventions/Progress Updates: Session focused on problem solving and left attention.  SLP facilitated session with scheduling task that required patient to switch between to do list and schedule with times on left; family provided prescription glasses and patient modified independent with self monitoring and corrections errors with increase wait time; patient able to identify new medications and reason for medication.  Patient supervision assist with rolling walker to bathroom and patient able to direct care by requesting SLP get her things she needed and informed where SLP could find them.  As a result patient has met all goals and even succeeded them.        Pain Pain Assessment Pain Assessment: No/denies pain Pain Score: 0-No pain  Speech Language Pathology Discharge Summary   Long term goals set: 3  Long term goals met: 3  Comments on progress toward goals: Upon admission patient was minimal assist -supervision semantic cues to complete complex problem solving tasks such as financial management and recall of new information.  Today patient was able to complete a scheduling task with her prescription glasses with modified independence and no left inattention observed.  Patient was able to recall new medication and function; as well as direct caregiver as needed.  Family was present for education regarding 24/7 supervision for safety and supervision level assist at home.  No further follow-up warranted at this time.   Reasons goals not met: n/a  Equipment acquired: none  Reasons for discharge: treatment goals met and discharge from  hospital  Follow-up: none  Patient/family agrees with progress made and goals achieved: Yes  Therapy/Group: Individual Therapy  Carmelia Roller., CCC-SLP [id number] [place] 01/09/2011 10:17 AM

## 2011-01-09 NOTE — Progress Notes (Signed)
Discharge summary [phone]

## 2011-01-09 NOTE — Progress Notes (Signed)
Patient was alert and oriented discharged with family at 16. Discussed discharge information and planning with patient and family. Patient had a PICC at admission and was discontinued by IV nurse before discharge.

## 2011-01-09 NOTE — Progress Notes (Signed)
Per State Regulation 482.30 This chart was reviewed for medical necessity with respect to the patient's Admission/Duration of stay. Pt has participated in therapies and met S goals today. Team in agreement that pt is ready for d/c today. Pt and her husband in agreement.  Flo Shanks                 Nurse Care Manager

## 2011-01-09 NOTE — Progress Notes (Signed)
Patient information reviewed and entered into [facility]PRO system by Eliese Kerwood, RN, CRRN, PPS Coordinator.  Information including medical coding and functional independence measure will be reviewed and updated through discharge.     Per nursing patient was given "Data Collection Information Summary for Patients in Inpatient Rehabilitation Facilities with attached "Privacy Act Statement-Health Care Records" upon admission.   

## 2011-01-09 NOTE — Progress Notes (Signed)
Social Work Assessment and Plan Assessment and Plan  Patient Name: [name]  [id number] Date: 01/09/2011  Problem List:  Patient Active Problem List  Diagnoses  . CHF (congestive heart failure)  . PAF (paroxysmal atrial fibrillation)  . Hypertension  . ICD (implantable cardiac defibrillator) in place  . SDH (subdural hematoma)    Past Medical History:  Past Medical History  Diagnosis Date  . NICM (nonischemic cardiomyopathy)   . COPD (chronic obstructive pulmonary disease)   . DM2 (diabetes mellitus, type 2)   . HLD (hyperlipidemia)   . Vitamin D deficiency   . CHF (congestive heart failure)     initial EF of 35%. Most recently was normal in 07/2009  . PAF (paroxysmal atrial fibrillation)   . Hypertension     Past Surgical History:  Past Surgical History  Procedure Date  . Cardiac defibrillator placement 2006  . Total abdominal hysterectomy 1983  . Cardiac catheterization     no significant CAD    Discharge Planning     Social/Family/Support Systems    Employment Status Employment Status Employment Status: Retired  Abuse/Neglect Abuse/Neglect Assessment (Assessment to be complete while patient is alone) Physical Abuse: Denies Verbal Abuse: Denies Sexual Abuse: Denies Exploitation of patient/patient's resources: Denies Self-Neglect: Denies  Emotional Status Emotional Status Pt's affect, behavior adn adjustment status: pleasant, oriented and denies any emotional distress - BDI screen deferred due to short LOS and no noted s/s depression  Recent Psychosocial Issues: none Pyschiatric History: none  Patient/Family Perceptions, Expectations & Goals Pt/Family Perceptions, Expectations and Goals Pt/Family understanding of illness & functional limitations: pt and husband with general understanding of medical issues and need for CIR Premorbid pt/family roles/activities: overall independent and actice at home and community level Anticipated changes in  roles/activities/participation: little change anticipated; husband can assume caregiver role as needed Pt/family expectations/goals: "go home"  [place]: None Premorbid Home Care/DME Agencies: None Transportation available at discharge: yes  Discharge Research scientist (life sciences) Resources: Multimedia programmer (specify) Personnel officer Medicare) Financial Resources: Social Security Financial Screen Referred: No Living Expenses: Own Money Management: Spouse Home Management: pt and spouse Patient/Family Preliminary Plans: home with family available to provide 24/7 assistance  Clinical Impression:  Pleasant, oriented woman here after SDH but with high level deficits, therefore, short LOS.  Family supportive and available to provide 24/7 assistance.  Will d/c home today.  Jordan Hawks, [place] 01/09/2011

## 2011-01-09 NOTE — Progress Notes (Signed)
Physical Therapy Discharge Summary  Patient Details  Name: Shametria Russi MRN: [id number] Date of Birth: Jan 19, 1940 Today's Date: 01/09/2011  Patient has met 4 of 5 long term goals due to improved activity tolerance, improved balance and increased strength.  Patient to discharge at an ambulatory level Supervision.  Pt with short length of stay, may have achieved mod I level of assist with longer rehab stay. Pt expresses eagerness to go home and has demonstrated and verbalized safety with all mobility and gait at a supervision level.  Family is aware of recommendation for supervision at d/c.  Recommendation:  Patient will benefit from ongoing skilled PT services in home health setting to continue to advance safe functional mobility, address ongoing impairments in balance, gait and mobility, and minimize fall risk.  Equipment: Equipment provided: RW  Patient/family agrees with progress made and goals achieved: Yes  DONAWERTH,KAREN 01/09/2011, 3:08 PM
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
## 2011-01-09 NOTE — Progress Notes (Signed)
Rt arm PICC placed at [facility] D/C'd.  [facility] report stated PICC was 39cm long.  Tip found to be intact at 41cm.  Removed without diffculty.  Vaseline pressure drsg. Applied.  Education S/S infection; bleeding, Drsg. Care.

## 2011-01-09 NOTE — Progress Notes (Signed)
Inpatient Rehabilitation Center Individual Statement of Services  Patient Name:  [name]  Date:  01/09/2011  Welcome to the [facility].  Our goal is to provide you with an individualized program based on your diagnosis and situation, designed to meet your specific needs.  With this comprehensive rehabilitation program, you will be expected to participate in at least 3 hours of rehabilitation therapies Monday-Friday, with modified therapy programming on the weekends.   Your rehabilitation program will include the following services:  Physical Therapy (PT), Occupational Therapy (OT), 24 hour per day rehabilitation nursing, Case Management (RN and Education officer, museum), Rehabilitation Medicine, Nutrition Services and Pharmacy Services  Weekly team conferences will be held on to discuss your progress.  Your RN Case Writer will talk with you frequently to get your input and to update you on team discussions.  Team conferences with you and your family in attendance may also be held.  Depending on your progress and recovery, your program may change.  Your RN Case Engineer, production will coordinate services and will keep you informed of any changes.  Your RN Tourist information centre manager and SW names and contact numbers are listed  below.  The following services may also be recommended but are not provided by the [place] will be made to provide these services after discharge if needed.  Arrangements include referral to agencies that provide these services.  Your insurance has been verified to be:  [employer] Your primary doctor is:  Dr. Celedonio Miyamoto  Pertinent information will be shared with your doctor and your insurance company.  Case Manager: Lutricia Feil, [facility] [phone]  Social Worker:  Lennart Pall,  [place] [phone]  ELOS: 5 days  Goal: Modified independent  Information discussed with and copy given to patient by: Flo Shanks, 01/09/2011

## 2011-01-09 NOTE — Plan of Care (Signed)
Problem: RH BOWEL ELIMINATION Goal: RH STG MANAGE BOWEL WITH ASSISTANCE Mod independent with wearing depends  Outcome: Completed/Met Date Met:  01/09/11 Pt did not wear depends here but does when at home Goal: RH STG MANAGE BOWEL W/EQUIPMENT W/ASSISTANCE Outcome: Completed/Met Date Met:  01/09/11 Min assist with RW to BR, continent per report

## 2011-01-09 NOTE — Progress Notes (Addendum)
Social Work  Discharge Note  The overall goal for the admission was met for:   Discharge location: Yes - home with husband and other family members  Length of Stay: Yes - 3 days  Discharge activity level: Yes - supervision - mod independent overall  Home/community participation: Yes  Services provided included: MD, RD, PT, OT, SLP, RN, CM, Pharmacy and SW  Financial Services: Other: [employer]  Follow-up services arranged: Home Health: PT and OT via [employer], DME: rolling walker and tub seat via [place] and Patient/Family has no preference for HH/DME agencies  Comments (or additional information):  Patient/Family verbalized understanding of follow-up arrangements: Yes  Individual responsible for coordination of the follow-up plan: husband  Confirmed correct DME delivered: Lennart Pall 01/09/2011    Lennart Pall    DME company changed to [employer]

## 2011-01-09 NOTE — Plan of Care (Signed)
Problem: RH SKIN INTEGRITY Goal: RH STG ABLE TO PERFORM INCISION/WOUND CARE W/ASSISTANCE Mod independent  Outcome: Completed/Met Date Met:  01/09/11 Reddened area to buttocks and skin folds, applied nystatin powder independently per report

## 2011-01-09 NOTE — Progress Notes (Signed)
Physical Therapy Note  Patient Details  Name: [name] MRN: [id number] Date of Birth: 1940-03-18 Today's Date: 01/09/2011  Time: 835-930 55 minutes  No c/o pain. Gait training with RW in controlled and household environment with close supervision, cues for obstacle negotiation.  Ramp/curb training with RW for home entry and community use with supervision, cues for technique.  Gait training without RW for balance training.  Dynamic gait with head turns, speed and direction changes with occasional min A for LOB especially with looking up or left.  Pt advised to use RW at home to prevent falls and increase independence.  Car transfers with RW with supervision to simulated Jeep.  Stair training with B handrails 12 stairs with supervision.  Bed mobility with mod I.  Bed making without RW with close supervision with cues for safety, picking up towels from floor and folding with close supervision.  Pt requires cues for rest breaks and was educated on energy conservation.   Individual therapy   Dasani Crear 01/09/2011, 9:37 AM
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
## 2011-02-15 ENCOUNTER — Encounter: Payer: Medicare PPO | Attending: Physical Medicine & Rehabilitation | Admitting: Physical Medicine & Rehabilitation

## 2011-02-15 DIAGNOSIS — W19XXXA Unspecified fall, initial encounter: Secondary | ICD-10-CM

## 2011-02-15 DIAGNOSIS — R413 Other amnesia: Secondary | ICD-10-CM | POA: Insufficient documentation

## 2011-02-15 DIAGNOSIS — I4891 Unspecified atrial fibrillation: Secondary | ICD-10-CM | POA: Insufficient documentation

## 2011-02-15 DIAGNOSIS — R5383 Other fatigue: Secondary | ICD-10-CM | POA: Insufficient documentation

## 2011-02-15 DIAGNOSIS — I6200 Nontraumatic subdural hemorrhage, unspecified: Secondary | ICD-10-CM | POA: Insufficient documentation

## 2011-02-15 DIAGNOSIS — S069X9A Unspecified intracranial injury with loss of consciousness of unspecified duration, initial encounter: Secondary | ICD-10-CM

## 2011-02-15 DIAGNOSIS — R5381 Other malaise: Secondary | ICD-10-CM | POA: Insufficient documentation

## 2011-02-15 NOTE — Assessment & Plan Note (Signed)
[name] is back regarding her right subdural hemorrhage.  We had her on rehab in late December and she was discharged home.  She just finished up home therapy.  She is walking around the house without a cane currently.  She does use a cane for longer distances.  She is doing house cleaning, meal preparation, etc.  She does have occasional problems with memory but states not too far beyond her baseline.  She may get distracted while cooking and leave something too long on the stove.  She does feel that she fatigues a bit and is a bit weak.  She states that she sees Cardiology next week regarding a potential pacemaker.  She had no further problems with seizures.  She is tolerating her medications fairly well.  She has 2 antilipid agents with her today and questions which one she should be using.  REVIEW OF SYSTEMS:  Notable for the above.  Full 12-point review is in the written health and history section of the chart.  SOCIAL HISTORY:  The patient is married.  Husband drives her around town.  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  She states that each house takes about 2 hours at a time to finish.  PHYSICAL EXAMINATION:  VITAL SIGNS:  Blood pressure is 132/67, pulse 76, respiratory rate is 16, and she is saturating 98% on room air. GENERAL:  The patient is pleasant, alert, oriented. HEART:  Regular. CHEST:  Clear. ABDOMEN:  Soft, nontender. MUSCULOSKELETAL:  Strength is grossly 5/5 in all limbs.  Sensory exam is 2/2.  Reflexes are 1+.  Cranial nerve exam shows no gross abnormalities. She had some mild short-term memory deficits but certainly memory was functional.  She was quite attentive.  Affect was pleasant and appropriate.  I had her ambulate for me today.  She had no difficulties. She is able to transfer without deficits.  She walked heel-toe with only minimal balance problems.  ASSESSMENT: 1. Right subdural  hemorrhage requiring craniotomy. 2. History of postoperative seizures. 3. Atrial fibrillation.  PLAN: 1. I did give the patient permission to resume her housecleaning in     about a month ago as long as Cardiology clears her to do so. 2. She asked about driving today and I would state that she is     probably at least 6 weeks away.  I would say that she needs about 3     months total of seizure prophylaxis given her history.  We can     decide when she sees me back in 6 weeks regarding driving and     seizure medications. 3. I told her to take her Trilipix at home by itself and not with the     TriCor.  She may use either one until she is out     of pills, but not both. 4. I will see her back in 6 weeks.  She has made very nice progress.     Meredith Staggers, M.D. Electronically Signed    ZTS/MedQ D:  02/15/2011 10:44:42  T:  02/15/2011 11:17:04  Job #:  [id number]  cc:   Celedonio Miyamoto, MD Fax: [phone]

## 2011-02-20 ENCOUNTER — Encounter: Payer: Self-pay | Admitting: Internal Medicine

## 2011-02-20 ENCOUNTER — Ambulatory Visit (INDEPENDENT_AMBULATORY_CARE_PROVIDER_SITE_OTHER): Payer: Medicare PPO | Admitting: *Deleted

## 2011-02-20 DIAGNOSIS — I509 Heart failure, unspecified: Secondary | ICD-10-CM

## 2011-02-20 DIAGNOSIS — I4891 Unspecified atrial fibrillation: Secondary | ICD-10-CM

## 2011-02-20 DIAGNOSIS — I480 Paroxysmal atrial fibrillation: Secondary | ICD-10-CM

## 2011-02-20 LAB — ICD DEVICE OBSERVATION
AL AMPLITUDE: 2.1 mv
AL IMPEDENCE ICD: 385 Ohm
BAMS-0001: 180 {beats}/min
BAMS-0003: 75 {beats}/min
BATTERY VOLTAGE: 2.43 V
BRDY-0003RA: 130 {beats}/min
DEV-0020ICD: NEGATIVE
LV LEAD IMPEDENCE ICD: 680 Ohm
MODE SWITCH EPISODES: 3
RV LEAD IMPEDENCE ICD: 335 Ohm
TOT-0007: 2
TOT-0010: 40
TZAT-0001SLOWVT: 1
TZAT-0004SLOWVT: 8
TZAT-0012SLOWVT: 200 ms
TZAT-0013SLOWVT: 3
TZON-0004SLOWVT: 12
TZST-0001SLOWVT: 3
TZST-0001SLOWVT: 5
TZST-0003SLOWVT: 25 J
TZST-0003SLOWVT: 36 J
VENTRICULAR PACING ICD: 89 pct

## 2011-02-21 ENCOUNTER — Encounter: Payer: Self-pay | Admitting: *Deleted

## 2011-02-21 ENCOUNTER — Encounter: Payer: Self-pay | Admitting: Internal Medicine

## 2011-02-21 ENCOUNTER — Ambulatory Visit (INDEPENDENT_AMBULATORY_CARE_PROVIDER_SITE_OTHER): Payer: Medicare PPO | Admitting: Internal Medicine

## 2011-02-21 ENCOUNTER — Encounter (HOSPITAL_COMMUNITY): Payer: Self-pay | Admitting: Pharmacy Technician

## 2011-02-21 DIAGNOSIS — I509 Heart failure, unspecified: Secondary | ICD-10-CM

## 2011-02-21 DIAGNOSIS — I5022 Chronic systolic (congestive) heart failure: Secondary | ICD-10-CM | POA: Insufficient documentation

## 2011-02-21 DIAGNOSIS — I428 Other cardiomyopathies: Secondary | ICD-10-CM | POA: Insufficient documentation

## 2011-02-21 DIAGNOSIS — Z95810 Presence of automatic (implantable) cardiac defibrillator: Secondary | ICD-10-CM | POA: Insufficient documentation

## 2011-02-21 DIAGNOSIS — I4891 Unspecified atrial fibrillation: Secondary | ICD-10-CM

## 2011-02-21 LAB — CBC WITH DIFFERENTIAL/PLATELET
Basophils Absolute: 0 10*3/uL (ref 0.0–0.1)
HCT: 30.2 % — ABNORMAL LOW (ref 36.0–46.0)
Lymphocytes Relative: 24.6 % (ref 12.0–46.0)
Lymphs Abs: 0.8 10*3/uL (ref 0.7–4.0)
Monocytes Relative: 8.2 % (ref 3.0–12.0)
Platelets: 137 10*3/uL — ABNORMAL LOW (ref 150.0–400.0)
RDW: 16.4 % — ABNORMAL HIGH (ref 11.5–14.6)

## 2011-02-21 LAB — BASIC METABOLIC PANEL
BUN: 11 mg/dL (ref 6–23)
Calcium: 8.6 mg/dL (ref 8.4–10.5)
Creatinine, Ser: 0.9 mg/dL (ref 0.4–1.2)
GFR: 68.04 mL/min (ref >60.00)
Glucose, Bld: 87 mg/dL (ref 70–99)

## 2011-02-21 LAB — PROTIME-INR: Prothrombin Time: 10.8 s (ref 10.2–12.4)

## 2011-02-21 MED ORDER — POTASSIUM CHLORIDE CRYS ER 10 MEQ PO TBCR: EXTENDED_RELEASE_TABLET | ORAL | Status: DC

## 2011-02-21 MED ORDER — FUROSEMIDE 20 MG PO TABS: ORAL_TABLET | ORAL | Status: DC

## 2011-02-21 NOTE — Assessment & Plan Note (Signed)
We will continue her on current medications. We'll consider aldosterone antagonist

## 2011-02-21 NOTE — Progress Notes (Signed)
HPI  [name] is a 71 y.o. female   seen in followup for CRT-D implanted at [facility] for   nonischemic cardiomyopathy.  There has been intercurrent normalization   of left ventricular systolic function by an echo last summer   demonstrating an ejection fraction of 55%-60%.   Repeat echo12/12 EF 35% we do not have these records as the patient was hospitalized at [facility] in [facility] subdural hematoma requiring craniotomy complicated by seizures   The patient denies SOB, chest pain,or palpitations.  There has been no syncope or presyncope. There has been mild edema. Her diuretics were discontinued by the people at rehabilitation  She is currently not on anticoagulation 2/2 above   Past Medical History  Diagnosis Date  . NICM (nonischemic cardiomyopathy)   . COPD (chronic obstructive pulmonary disease)   . DM2 (diabetes mellitus, type 2)   . HLD (hyperlipidemia)   . Vitamin D deficiency   . CHF (congestive heart failure)     initial EF of 35%. Most recently was normal in 07/2009  . PAF (paroxysmal atrial fibrillation)   . Hypertension     Past Surgical History  Procedure Date  . Cardiac defibrillator placement 2006  . Total abdominal hysterectomy 1983  . Cardiac catheterization     no significant CAD    Current Outpatient Prescriptions  Medication Sig Dispense Refill  . ALBUTEROL SULFATE IN Inhale into the lungs.        . carvedilol (COREG) 25 MG tablet Take 25 mg by mouth 2 (two) times daily with a meal.        . Choline Fenofibrate (TRILIPIX) 135 MG capsule Take 135 mg by mouth daily.        . cyanocobalamin (,VITAMIN B-12,) 1000 MCG/ML injection Inject 1,000 mcg into the muscle every 30 (thirty) days.        . ergocalciferol (VITAMIN D2) 50000 UNITS capsule Take 50,000 Units by mouth 2 (two) times a week.        . fenofibrate (TRICOR) 145 MG tablet Take 145 mg by mouth daily.      . ferrous sulfate 325 (65 FE) MG tablet Take 1 tablet (325 mg total) by mouth 2 (two)  times daily with a meal.  60 tablet  1  . lactase (LACTAID) 3000 UNITS tablet Take 1 tablet by mouth daily.        [name] levETIRAcetam (KEPPRA) 750 MG tablet Take 1 tablet (750 mg total) by mouth 2 (two) times daily.  60 tablet  1  . pantoprazole (PROTONIX) 40 MG tablet Take 1 tablet (40 mg total) by mouth 2 (two) times daily before a meal.  60 tablet  1  . potassium chloride (K-DUR,KLOR-CON) 10 MEQ tablet Take 2 tablets (20 mEq total) by mouth 2 (two) times daily.  30 tablet  0  . quinapril (ACCUPRIL) 10 MG tablet Take 1 tablet (10 mg total) by mouth at bedtime.  30 tablet  6  . simvastatin (ZOCOR) 40 MG tablet Take 20 mg by mouth at bedtime.       . sitaGLIPtan (JANUVIA) 100 MG tablet Take 100 mg by mouth daily.        . Vitamin D, Ergocalciferol, (DRISDOL) 50000 UNITS CAPS Take 1 capsule (50,000 Units total) by mouth every 7 (seven) days.  4 capsule  1    Allergies  Allergen Reactions  . Codeine   . Lactose Intolerance (Gi)   . Valium     Review of Systems negative except from  HPI and PMH  Physical Exam BP 152/80  Pulse 75  Ht 5' 4.5" (1.638 m)  Wt 132 lb 12.8 oz (60.238 kg)  BMI 22.44 kg/m2 Well developed and well nourished in no acute distress HENT normal E scleral and icterus clear Neck Supple JVP7-8cm; carotids brisk and full Clear to ausculation Regular rate and rhythm, no murmurs gallops or rub Soft with active bowel sounds No clubbing cyanosis Trace Edema Alert and oriented, grossly normal motor and sensory function Skin Warm and Dry  ecg  P-synchronous BiV  pacing  Assessment and  Plan'

## 2011-02-21 NOTE — Patient Instructions (Signed)
Your physician has recommended you make the following change in your medication:  1) Start lasix (furosemide) 20 mg one tablet by mouth every other day (with potassium). 2) Start potassium 10 meq one tablet by mouth every other day (with lasix).  Your physician has recommended that you have a defibrillator generator change. Please see the instruction sheet given to you today for more information.

## 2011-02-21 NOTE — Assessment & Plan Note (Signed)
The patient's device has reached ERI. We discussed generator replacement including the risks of infection. She understands and is willing to proceed. Most recent ejection fraction remained low.

## 2011-02-21 NOTE — Assessment & Plan Note (Signed)
She is not on anticoagulation secondary to her subdural hematoma. There some discussions output in her back on aspirin. We'll plan to have discussions later about this as he AVERROES study was used and demonstrated better outcomes with apixoban the aspirin and patient's were felt not to becandidate for Coumadin

## 2011-02-28 MED ORDER — SODIUM CHLORIDE 0.9 % IR SOLN
80.0000 mg | Status: DC
  Filled 2011-02-28 (×2): qty 2

## 2011-02-28 MED ORDER — CEFAZOLIN SODIUM 1-5 GM-% IV SOLN: 1.0000 g | INTRAVENOUS | Status: DC

## 2011-03-01 ENCOUNTER — Observation Stay (HOSPITAL_COMMUNITY): Payer: Medicare PPO

## 2011-03-01 ENCOUNTER — Encounter (HOSPITAL_COMMUNITY)
Admission: RE | Disposition: A | Discharge status: Home / Self Care | Payer: Self-pay | Source: Ambulatory Visit | Attending: Internal Medicine

## 2011-03-01 ENCOUNTER — Ambulatory Visit (HOSPITAL_COMMUNITY): Payer: Medicare PPO

## 2011-03-01 ENCOUNTER — Observation Stay (HOSPITAL_COMMUNITY)
Admission: RE | Admit: 2011-03-01 | Discharge: 2011-03-02 | Disposition: A | Discharge status: Home / Self Care | Payer: Medicare PPO | Source: Ambulatory Visit | Attending: Internal Medicine | Admitting: Internal Medicine

## 2011-03-01 DIAGNOSIS — J449 Chronic obstructive pulmonary disease, unspecified: Secondary | ICD-10-CM | POA: Insufficient documentation

## 2011-03-01 DIAGNOSIS — I428 Other cardiomyopathies: Secondary | ICD-10-CM

## 2011-03-01 DIAGNOSIS — I4891 Unspecified atrial fibrillation: Secondary | ICD-10-CM | POA: Insufficient documentation

## 2011-03-01 DIAGNOSIS — Z4502 Encounter for adjustment and management of automatic implantable cardiac defibrillator: Principal | ICD-10-CM | POA: Insufficient documentation

## 2011-03-01 DIAGNOSIS — I5022 Chronic systolic (congestive) heart failure: Secondary | ICD-10-CM | POA: Insufficient documentation

## 2011-03-01 DIAGNOSIS — J4489 Other specified chronic obstructive pulmonary disease: Secondary | ICD-10-CM | POA: Insufficient documentation

## 2011-03-01 DIAGNOSIS — I480 Paroxysmal atrial fibrillation: Secondary | ICD-10-CM | POA: Insufficient documentation

## 2011-03-01 DIAGNOSIS — I509 Heart failure, unspecified: Secondary | ICD-10-CM | POA: Insufficient documentation

## 2011-03-01 DIAGNOSIS — E119 Type 2 diabetes mellitus without complications: Secondary | ICD-10-CM | POA: Insufficient documentation

## 2011-03-01 DIAGNOSIS — E559 Vitamin D deficiency, unspecified: Secondary | ICD-10-CM | POA: Insufficient documentation

## 2011-03-01 DIAGNOSIS — Z95810 Presence of automatic (implantable) cardiac defibrillator: Secondary | ICD-10-CM | POA: Insufficient documentation

## 2011-03-01 DIAGNOSIS — E785 Hyperlipidemia, unspecified: Secondary | ICD-10-CM | POA: Insufficient documentation

## 2011-03-01 DIAGNOSIS — I10 Essential (primary) hypertension: Secondary | ICD-10-CM | POA: Insufficient documentation

## 2011-03-01 HISTORY — PX: IMPLANTABLE CARDIOVERTER DEFIBRILLATOR (ICD) GENERATOR CHANGE: SHX5469

## 2011-03-01 LAB — SURGICAL PCR SCREEN: Staphylococcus aureus: NEGATIVE

## 2011-03-01 SURGERY — ICD GENERATOR CHANGE
Anesthesia: LOCAL

## 2011-03-01 MED ORDER — LINAGLIPTIN 5 MG PO TABS
5.0000 mg | ORAL_TABLET | Freq: Every day | ORAL | Status: DC
  Administered 2011-03-01 – 2011-03-02 (×2): 5 mg via ORAL
  Filled 2011-03-01 (×2): qty 1

## 2011-03-01 MED ORDER — SIMVASTATIN 20 MG PO TABS
20.0000 mg | ORAL_TABLET | Freq: Every day | ORAL | Status: DC
  Administered 2011-03-01: 20 mg via ORAL
  Filled 2011-03-01 (×2): qty 1

## 2011-03-01 MED ORDER — SODIUM CHLORIDE 0.9 % IV SOLN: INTRAVENOUS | Status: DC

## 2011-03-01 MED ORDER — FENTANYL CITRATE 0.05 MG/ML IJ SOLN
INTRAMUSCULAR | Status: AC
  Filled 2011-03-01: qty 2

## 2011-03-01 MED ORDER — MUPIROCIN 2 % EX OINT
TOPICAL_OINTMENT | CUTANEOUS | Status: AC
  Administered 2011-03-01: 1 via NASAL
  Filled 2011-03-01: qty 22

## 2011-03-01 MED ORDER — CHLORHEXIDINE GLUCONATE 4 % EX LIQD
60.0000 mL | Freq: Once | CUTANEOUS | Status: DC
  Filled 2011-03-01: qty 60

## 2011-03-01 MED ORDER — MIDAZOLAM HCL 5 MG/5ML IJ SOLN
INTRAMUSCULAR | Status: AC
  Filled 2011-03-01: qty 5

## 2011-03-01 MED ORDER — CARVEDILOL 25 MG PO TABS
25.0000 mg | ORAL_TABLET | Freq: Two times a day (BID) | ORAL | Status: DC
  Administered 2011-03-01 – 2011-03-02 (×2): 25 mg via ORAL
  Filled 2011-03-01 (×4): qty 1

## 2011-03-01 MED ORDER — LISINOPRIL 5 MG PO TABS
5.0000 mg | ORAL_TABLET | Freq: Every day | ORAL | Status: DC
  Administered 2011-03-01 – 2011-03-02 (×2): 5 mg via ORAL
  Filled 2011-03-01 (×2): qty 1

## 2011-03-01 MED ORDER — SODIUM CHLORIDE 0.9 % IV SOLN
INTRAVENOUS | Status: DC
  Administered 2011-03-01: 1000 mL via INTRAVENOUS

## 2011-03-01 MED ORDER — FERROUS SULFATE 325 (65 FE) MG PO TABS
325.0000 mg | ORAL_TABLET | Freq: Two times a day (BID) | ORAL | Status: DC
Start: 2011-03-01 — End: 2011-03-02
  Administered 2011-03-01 – 2011-03-02 (×2): 325 mg via ORAL
  Filled 2011-03-01 (×4): qty 1

## 2011-03-01 MED ORDER — POTASSIUM CHLORIDE CRYS ER 10 MEQ PO TBCR
10.0000 meq | EXTENDED_RELEASE_TABLET | Freq: Every day | ORAL | Status: DC
  Administered 2011-03-01: 10 meq via ORAL
  Filled 2011-03-01 (×2): qty 1

## 2011-03-01 MED ORDER — ALBUTEROL SULFATE HFA 108 (90 BASE) MCG/ACT IN AERS
2.0000 | INHALATION_SPRAY | Freq: Four times a day (QID) | RESPIRATORY_TRACT | Status: DC | PRN
  Filled 2011-03-01: qty 6.7

## 2011-03-01 MED ORDER — MUPIROCIN 2 % EX OINT
TOPICAL_OINTMENT | Freq: Two times a day (BID) | CUTANEOUS | Status: DC
  Administered 2011-03-01 (×2): 1 via NASAL
  Administered 2011-03-02: 10:00:00 via NASAL
  Filled 2011-03-01 (×2): qty 22

## 2011-03-01 MED ORDER — FUROSEMIDE 20 MG PO TABS
20.0000 mg | ORAL_TABLET | Freq: Every day | ORAL | Status: DC
  Administered 2011-03-01: 20 mg via ORAL
  Filled 2011-03-01 (×2): qty 1

## 2011-03-01 MED ORDER — PANTOPRAZOLE SODIUM 40 MG PO TBEC
40.0000 mg | DELAYED_RELEASE_TABLET | Freq: Two times a day (BID) | ORAL | Status: DC
  Administered 2011-03-01 – 2011-03-02 (×2): 40 mg via ORAL
  Filled 2011-03-01: qty 1

## 2011-03-01 MED ORDER — ACETAMINOPHEN 325 MG PO TABS: 325.0000 mg | ORAL_TABLET | ORAL | Status: DC | PRN

## 2011-03-01 MED ORDER — LIDOCAINE HCL (PF) 1 % IJ SOLN
INTRAMUSCULAR | Status: AC
  Filled 2011-03-01: qty 60

## 2011-03-01 MED ORDER — CEFAZOLIN SODIUM 1-5 GM-% IV SOLN
INTRAVENOUS | Status: AC
  Filled 2011-03-01: qty 50

## 2011-03-01 MED ORDER — ONDANSETRON HCL 4 MG/2ML IJ SOLN: 4.0000 mg | Freq: Four times a day (QID) | INTRAMUSCULAR | Status: DC | PRN

## 2011-03-01 MED ORDER — CEFAZOLIN SODIUM 1-5 GM-% IV SOLN
1.0000 g | Freq: Once | INTRAVENOUS | Status: AC
  Administered 2011-03-02: 1 g via INTRAVENOUS
  Filled 2011-03-01: qty 50

## 2011-03-01 MED ORDER — LEVETIRACETAM 750 MG PO TABS
750.0000 mg | ORAL_TABLET | Freq: Two times a day (BID) | ORAL | Status: DC
  Administered 2011-03-01 – 2011-03-02 (×3): 750 mg via ORAL
  Filled 2011-03-01 (×4): qty 1

## 2011-03-01 NOTE — Brief Op Note (Signed)
03/01/2011  10:30 AM  PATIENT:  [name]  71 y.o. female  PRE-OPERATIVE DIAGNOSIS:  heart failure/eri  POST-OPERATIVE DIAGNOSIS:  * No post-op diagnosis entered *  PROCEDURE:  Procedure(s) (LRB): ICD GENERATOR CHANGE (N/A)  SURGEON:  Surgeon(s) and Role:    * Deboraha Sprang, MD - Primary  PHYSICIAN ASSISTANT:   ASSISTANTS: none   ANESTHESIA:   local and IV sedation   DICTATION: .Other Dictation: Dictation Number [id number]  PLAN OF CARE: observaton  PATIENT DISPOSITION:  PACU - hemodynamically stable.   Delay start of Pharmacological VTE agent (>24hrs) due to surgical blood loss or risk of bleeding: not applicable

## 2011-03-01 NOTE — Interval H&P Note (Signed)
History and Physical Interval Note:  03/01/2011 7:53 AM  [name]  has presented today for surgery, with the diagnosis of heart failure/eri  The various methods of treatment have been discussed with the patient and family. After consideration of risks, benefits and other options for treatment, the patient has consented to  Procedure(s) (LRB): ICD GENERATOR CHANGE (N/A) as a surgical intervention .  The patients' history has been reviewed, patient examined, no change in status, stable for surgery.  I have reviewed the patients' chart and labs.  Questions were answered to the patient's satisfaction.     Virl Axe

## 2011-03-01 NOTE — Op Note (Signed)
[name], [name] NO.:  [id number]  MEDICAL RECORD NO.:  [id number]  LOCATION:  [facility]                         FACILITY:  [facility]  PHYSICIAN:  Deboraha Sprang, MD, FACCDATE OF BIRTH:  August 29, 1940  DATE OF PROCEDURE: DATE OF DISCHARGE:                              OPERATIVE REPORT   PREOPERATIVE DIAGNOSIS:  Previously implanted ICD-CRT, now at elective replacement indicator.  POSTOPERATIVE DIAGNOSIS:  Previously implanted ICD-CRT, now at elective replacement indicator; externalization of the defibrillator lead; occlusion of the left subclavian vein.  PROCEDURE:  Contrast venogram, explantation of a previously implanted device, attempted implantation of a new lead and insertion of a new device with intraoperative defibrillation threshold testing.  Following obtaining informed consent, the patient was brought to electrophysiology laboratory and placed on the fluoroscopic table in supine position.  After routine prep and drape, lidocaine was infiltrated in prepectoral subclavicular region and carried down to the layer of the prepectoral fascia using electrocautery and sharp dissection.  I should note that a venogram had demonstrated what I thought was stenosis of the left subclavian vein.  There were major collaterals where I thought I saw a contrast flow moving across the entirety of the subclavian vein as well as fluoroscopy demonstrating externalization of the defibrillator lead at its distal ramification just proximal to the distal coil.  Prior to opening the pocket, we then attempted access to the left subclavian vein, which for the 2nd time in a week and a 2nd time in a decade I was unable to accomplish.  We got blood draw return in a couple of times.  I tried to pass a micropuncture wire one time, I thought I had it and contrast injected demonstrated externalization of contrast.  At this point, high-voltage testing was undertaken of the defibrillator  system using the old device with a 1 joule shock being delivered through measured impedance of 33 ohms. Given the demonstrated integrity then of the high-voltage circuitry, it was elected to given the difficulties and putting in a new lead to insert a new device.  Interrogation of the previously implanted LV lead demonstrated an R-wave of 5.2 with a pace impedance of 603 threshold 1.5 at 0.5 and current threshold 2.7 mA.  Bipolar P-wave was 2.2 with a pace impedance of 377.  Threshold 0.7 at 0.5, current threshold 1.4 mA.  With these acceptable parameters recorded, the leads were attached to a St. Jude CD 3257 device, serial [id number].  Through the device, bipolar, P-wave was 2 with a pace impedance of 390 threshold 0.7 at 0.5.  The R-wave was 8.9 with a pace impedance of 310.  Threshold 0.7 at 0.5.  The LV impedance was 69 with threshold 1.25 at 0.8.  High-voltage impedance was 35 ohms.  Ventricular fibrillation was then induced via the T-wave shock after total duration of 6 seconds, a 22 joule shock was delivered through a measured resistance of 37 ohms terminating ventricular fibrillation and restoring sinus rhythm.  The device was implanted.  The pocket was copiously irrigated with antibiotic containing saline solution. Hemostasis was assured.  Leads and pulse generator were placed in the pocket, secured to the prepectoral fascia.  I should  note at this juncture that the defibrillator lead had been packaged anterior to the can and was encased in fibrous tissue on the anterior aspect of the pocket.  It was not excavated.  In addition, because the patient had complained of rotation of the device into her axilla, it was attempted to try to deploy the defibrillator generator as medially as possible. This was further complicated by deep scarring capsulation of the RA and LV lead that what have again prompted its need to be excavated in their entirety to allow for medial deployment of the  system.  This was not done and what I did was to take a suture and move the cephalad aspect about a cm more medially.  I also took the suture and encompassed the leads lightly just proximal to the header with the idea being to prevent any protrusion of the leads anteriorly through the skin.  This seemed to be accomplished and the wound was then closed in 3 layers, in normal fashion following antibiotic irrigation.  The patient tolerated the procedure well without apparent complication.     Deboraha Sprang, MD, [facility]     SCK/MEDQ  D:  03/01/2011  T:  03/01/2011  Job:  [id number]

## 2011-03-01 NOTE — Progress Notes (Signed)
CXR show s tiny  Apical pneumothorax possibly  Will plan to repeat the cxr at 1800 hours and will alos plan on keeping pt in house overnight and recheck CXR in the am

## 2011-03-01 NOTE — H&P (View-Only) (Signed)
HPI  [name] is a 71 y.o. female   seen in followup for CRT-D implanted at [facility] for   nonischemic cardiomyopathy.  There has been intercurrent normalization   of left ventricular systolic function by an echo last summer   demonstrating an ejection fraction of 55%-60%.   Repeat echo12/12 EF 35% we do not have these records as the patient was hospitalized at [facility] in [facility] subdural hematoma requiring craniotomy complicated by seizures   The patient denies SOB, chest pain,or palpitations.  There has been no syncope or presyncope. There has been mild edema. Her diuretics were discontinued by the people at rehabilitation  She is currently not on anticoagulation 2/2 above   Past Medical History  Diagnosis Date  . NICM (nonischemic cardiomyopathy)   . COPD (chronic obstructive pulmonary disease)   . DM2 (diabetes mellitus, type 2)   . HLD (hyperlipidemia)   . Vitamin D deficiency   . CHF (congestive heart failure)     initial EF of 35%. Most recently was normal in 07/2009  . PAF (paroxysmal atrial fibrillation)   . Hypertension     Past Surgical History  Procedure Date  . Cardiac defibrillator placement 2006  . Total abdominal hysterectomy 1983  . Cardiac catheterization     no significant CAD    Current Outpatient Prescriptions  Medication Sig Dispense Refill  . ALBUTEROL SULFATE IN Inhale into the lungs.        . carvedilol (COREG) 25 MG tablet Take 25 mg by mouth 2 (two) times daily with a meal.        . Choline Fenofibrate (TRILIPIX) 135 MG capsule Take 135 mg by mouth daily.        . cyanocobalamin (,VITAMIN B-12,) 1000 MCG/ML injection Inject 1,000 mcg into the muscle every 30 (thirty) days.        . ergocalciferol (VITAMIN D2) 50000 UNITS capsule Take 50,000 Units by mouth 2 (two) times a week.        . fenofibrate (TRICOR) 145 MG tablet Take 145 mg by mouth daily.      . ferrous sulfate 325 (65 FE) MG tablet Take 1 tablet (325 mg total) by mouth 2 (two)  times daily with a meal.  60 tablet  1  . lactase (LACTAID) 3000 UNITS tablet Take 1 tablet by mouth daily.        [name] levETIRAcetam (KEPPRA) 750 MG tablet Take 1 tablet (750 mg total) by mouth 2 (two) times daily.  60 tablet  1  . pantoprazole (PROTONIX) 40 MG tablet Take 1 tablet (40 mg total) by mouth 2 (two) times daily before a meal.  60 tablet  1  . potassium chloride (K-DUR,KLOR-CON) 10 MEQ tablet Take 2 tablets (20 mEq total) by mouth 2 (two) times daily.  30 tablet  0  . quinapril (ACCUPRIL) 10 MG tablet Take 1 tablet (10 mg total) by mouth at bedtime.  30 tablet  6  . simvastatin (ZOCOR) 40 MG tablet Take 20 mg by mouth at bedtime.       . sitaGLIPtan (JANUVIA) 100 MG tablet Take 100 mg by mouth daily.        . Vitamin D, Ergocalciferol, (DRISDOL) 50000 UNITS CAPS Take 1 capsule (50,000 Units total) by mouth every 7 (seven) days.  4 capsule  1    Allergies  Allergen Reactions  . Codeine   . Lactose Intolerance (Gi)   . Valium     Review of Systems negative except from  HPI and PMH  Physical Exam BP 152/80  Pulse 75  Ht 5' 4.5" (1.638 m)  Wt 132 lb 12.8 oz (60.238 kg)  BMI 22.44 kg/m2 Well developed and well nourished in no acute distress HENT normal E scleral and icterus clear Neck Supple JVP7-8cm; carotids brisk and full Clear to ausculation Regular rate and rhythm, no murmurs gallops or rub Soft with active bowel sounds No clubbing cyanosis Trace Edema Alert and oriented, grossly normal motor and sensory function Skin Warm and Dry  ecg  P-synchronous BiV  pacing  Assessment and  Plan'

## 2011-03-02 ENCOUNTER — Other Ambulatory Visit: Payer: Self-pay

## 2011-03-02 ENCOUNTER — Encounter (HOSPITAL_COMMUNITY): Payer: Self-pay | Admitting: Nurse Practitioner

## 2011-03-02 ENCOUNTER — Observation Stay (HOSPITAL_COMMUNITY): Payer: Medicare PPO

## 2011-03-02 LAB — GLUCOSE, CAPILLARY: Glucose-Capillary: 119 mg/dL — ABNORMAL HIGH (ref 70–99)

## 2011-03-02 NOTE — Progress Notes (Signed)
UR Completed. Simmons, Tashana Haberl F [phone]  

## 2011-03-02 NOTE — Progress Notes (Signed)
Patient ID: [name], female   DOB: [birth year], 71 y.o.   MRN: [id number] Subjective:  S/p ICD implant  Objective:  Vital Signs in the last 24 hours: Temp:  [98.4 F (36.9 C)-98.7 F (37.1 C)] 98.4 F (36.9 C) (02/14 0425) Pulse Rate:  [73-77] 73  (02/14 0425) Resp:  [18] 18  (02/14 0425) BP: (131-139)/(55-71) 139/71 mmHg (02/14 0425) SpO2:  [93 %-94 %] 94 % (02/14 0425)  Intake/Output from previous day: 02/13 0701 - 02/14 0700 In: 600 [P.O.:600] Out: 1100 [Urine:1100] Intake/Output from this shift: Total I/O In: 240 [P.O.:240] Out: -   Physical Exam: Well appearing NAD HEENT: Unremarkable Neck:  No JVD, no thyromegally Lymphatics:  No adenopathy Back:  No CVA tenderness Lungs:  Clear with no wheezes. No hematoma. HEART:  Regular rate rhythm, no murmurs, no rubs, no clicks Abd:  Flat, positive bowel sounds, no organomegally, no rebound, no guarding Ext:  2 plus pulses, no edema, no cyanosis, no clubbing Skin:  No rashes no nodules Neuro:  CN II through XII intact, motor grossly intact  Lab Results: No results found for this basename: WBC:2,HGB:2,PLT:2 in the last 72 hours No results found for this basename: NA:2,K:2,CL:2,CO2:2,GLUCOSE:2,BUN:2,CREATININE:2 in the last 72 hours No results found for this basename: TROPONINI:2,CK,MB:2 in the last 72 hours Hepatic Function Panel No results found for this basename: PROT,ALBUMIN,AST,ALT,ALKPHOS,BILITOT,BILIDIR,IBILI in the last 72 hours No results found for this basename: CHOL in the last 72 hours No results found for this basename: PROTIME in the last 72 hours  Imaging: Dg Chest 2 View  03/02/2011  *RADIOLOGY REPORT*  Clinical Data: Left cardiac ICD.  Evaluate for a left apical pneumothorax.  CHEST - 2 VIEW  Comparison: 03/01/2011  Findings: Two views of the chest were obtained.  There is a tiny amount of lucency along the left lung apex which is unchanged.  If this represents a tiny pneumothorax, it is stable.  There is  marked hyperinflation of the lungs with the scarring at the lung apices. Stable appearance of the left cardiac ICD.  Again noted are streaky densities in the upper lungs which could be related to scarring and bony structures.  The heart size is stable in size.  There is no evidence for edema or pleural effusions.  IMPRESSION: Stable lucency at the left lung apex.  If this represents a tiny pneumothorax, it is stable.  Chronic lung changes with hyperinflation and marked scarring in the lung apices.  Again noted are opacities in the upper lungs could be related to overlying shadows.  Difficult to exclude an airspace process, particularly in the left upper lung.  Original Report Authenticated By: Markus Daft, M.D.   Dg Chest Portable 1 View  03/01/2011  *RADIOLOGY REPORT*  Clinical Data: Pneumothorax  PORTABLE CHEST - 1 VIEW  Comparison: Earlier film of the same day  Findings: Stable small left apical pneumothorax.  Left subclavian AICD stable.  Moderate cardiomegaly as before.  No effusion.  Little change in left upper lobe airspace disease. There is biapical atelectasis or scarring.  IMPRESSION:  1.  Stable small left apical pneumothorax.  Original Report Authenticated By: Trecia Rogers, M.D.   Dg Chest Port 1 View  03/01/2011  *RADIOLOGY REPORT*  Clinical Data: Pacemaker insertion.  Evaluate for pneumothorax.  PORTABLE CHEST - 1 VIEW  Comparison: 12/23/2010.  Findings: Trachea is midline.  Heart is enlarged.  Pacemaker and AICD lead tips are stable from 12/23/2010.  Biapical pleural parenchymal scarring.  There is a  sliver of lucency at the apex of the left hemithorax, which appears new from prior exams. Rounded opacification projecting over the left upper lobe appears new. Lungs are otherwise grossly clear.  IMPRESSION:  1.  Suspect tiny left apical pneumothorax.  Please see discussion above. 2.  New opacity and surrounding lucency projecting over the upper left hemithorax which may be within the  overlying soft tissues, as can be seen with a hematoma.  Left upper lobe air space disease cannot be excluded.  Original Report Authenticated By: Luretha Rued, M.D.    Cardiac Studies: Tele - NSR Assessment/Plan:  1. S/p BiV ICD 2. Small apical PTX - stable. 3. DCM/Chronic systolic CHF REC: DC home. Usual followup.  LOS: 1 day    Cristopher Peru 03/02/2011, 2:10 PM

## 2011-03-02 NOTE — Discharge Summary (Signed)
Patient ID: [name],  MRN: [id number], DOB/AGE: 21-Dec-1940 71 y.o.  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 Codeine Other (See Comments)    "puts me on a trip"  . Lactose Intolerance (Gi) Diarrhea  . Valium Hives and Rash    Procedures  03/01/2011  Contrast venogram, explantation of a previously implanted Device (ERI) , attempted implantation of a new lead, and insertion of a new device (St. Jude CD 3257 device, serial [id number]) with intraoperative defibrillation threshold testing.  History of Present Illness  71 y/o female with the above problem list who was recently seen in clinic by Dr. Caryl Comes and upon interrogation of her ICD, it was noted that it had reached elective replacement indicators.  Arrangements were made for elective replacement.  Hospital Course  Pt presented to [facility] EP lab on 2/13 and underwent successful device change-out with placement of a new St. Jude CD 3257 Bi-V ICD.  Initially, it was planned that pt would be d/c'd the same day however f/u CXR showed a small left lung apical pneumothorax.  Pt was placed on Oxygen and f/u cxr later in the day showed stability of ptx.  Pt was observed overnight and cxr this am has continued to show stability of the ptx.  Pt has been asymptomatic and will be discharged today in good condition.  Discharge Vitals Blood pressure 139/71, pulse 73, temperature 98.4 F (36.9 C), temperature source Oral, resp. rate 18, height 5' 4.5" (1.638 m), weight 132 lb (59.875 kg), SpO2 94.00%.  Filed Weights   03/01/11 0607  Weight: 132 lb (59.875 kg)   Disposition  Pt is  being discharged home today in good condition.  Follow-up Plans & Appointments  Follow-up Information    Follow up with Virl Axe, MD on 06/13/2011. (9:00)       Follow up with [facility] on 03/15/2011. (11:45)    Contact information:   [phone]         Discharge Medications  Medication List  As of 03/02/2011  2:09 PM   TAKE these medications         albuterol 108 (90 BASE) MCG/ACT inhaler   Commonly known as: PROVENTIL HFA;VENTOLIN HFA   Inhale 2 puffs into the lungs every 6 (six) hours as needed. For wheezing      carvedilol 25 MG tablet   Commonly known as: COREG   Take 25 mg by mouth 2 (two) times daily with a meal.      ergocalciferol 50000 UNITS capsule   Commonly known as: VITAMIN D2   Take 50,000 Units by mouth 2 (two) times a week. On Monday and Friday      fenofibrate 145 MG tablet   Commonly known as: TRICOR   Take 145 mg by mouth daily.      ferrous sulfate 325 (65 FE) MG tablet   Take 325 mg by mouth 2 (two) times daily with a meal.      furosemide 20 MG tablet   Commonly known as: LASIX   Take 20 mg by mouth daily. Take every other day (with potassium)      levETIRAcetam 750 MG tablet   Commonly known as: KEPPRA   Take  750 mg by mouth 2 (two) times daily.      pantoprazole 40 MG tablet   Commonly known as: PROTONIX   Take 40 mg by mouth 2 (two) times daily before a meal.      potassium chloride 10 MEQ tablet   Commonly known as: K-DUR,KLOR-CON   Take 10 mEq by mouth daily. Take every other day with lasix (furosemide)      quinapril 10 MG tablet   Commonly known as: ACCUPRIL   Take 10 mg by mouth at bedtime.      simvastatin 40 MG tablet   Commonly known as: ZOCOR   Take 20 mg by mouth at bedtime.      sitaGLIPtin 100 MG tablet   Commonly known as: JANUVIA   Take 100 mg by mouth daily.      TRILIPIX 135 MG capsule   Generic drug: Choline Fenofibrate   Take 135 mg by mouth daily.            Outstanding  Labs/Studies  None  Duration of Discharge Encounter   Greater than 30 minutes including physician time.  Signed, Murray Hodgkins NP 03/02/2011, 2:09 PM

## 2011-03-15 ENCOUNTER — Ambulatory Visit (INDEPENDENT_AMBULATORY_CARE_PROVIDER_SITE_OTHER): Payer: Medicare PPO | Admitting: *Deleted

## 2011-03-15 ENCOUNTER — Encounter: Payer: Self-pay | Admitting: Internal Medicine

## 2011-03-15 DIAGNOSIS — I5022 Chronic systolic (congestive) heart failure: Secondary | ICD-10-CM

## 2011-03-15 DIAGNOSIS — I480 Paroxysmal atrial fibrillation: Secondary | ICD-10-CM

## 2011-03-15 DIAGNOSIS — I428 Other cardiomyopathies: Secondary | ICD-10-CM

## 2011-03-15 LAB — ICD DEVICE OBSERVATION
AL AMPLITUDE: 2 mv
DEVICE MODEL ICD: 7025184
HV IMPEDENCE: 35 Ohm
MODE SWITCH EPISODES: 0
PACEART VT: 0
RV LEAD THRESHOLD: 0.625 V
TOT-0007: 1
TOT-0010: 2
TZAT-0013SLOWVT: 3
TZAT-0018SLOWVT: NEGATIVE
TZAT-0019SLOWVT: 7.5 V
TZAT-0020SLOWVT: 1 ms
TZON-0005SLOWVT: 6
TZST-0001SLOWVT: 2
TZST-0001SLOWVT: 4
TZST-0003SLOWVT: 40 J
VF: 0

## 2011-03-15 NOTE — Progress Notes (Signed)
Wound check-ICD 

## 2011-03-27 ENCOUNTER — Encounter: Payer: Medicare PPO | Attending: Physical Medicine & Rehabilitation | Admitting: Physical Medicine & Rehabilitation

## 2011-03-27 ENCOUNTER — Encounter: Payer: Self-pay | Admitting: Physical Medicine & Rehabilitation

## 2011-03-27 DIAGNOSIS — I6200 Nontraumatic subdural hemorrhage, unspecified: Secondary | ICD-10-CM | POA: Insufficient documentation

## 2011-03-27 DIAGNOSIS — R569 Unspecified convulsions: Secondary | ICD-10-CM

## 2011-03-27 DIAGNOSIS — R413 Other amnesia: Secondary | ICD-10-CM | POA: Insufficient documentation

## 2011-03-27 DIAGNOSIS — R5381 Other malaise: Secondary | ICD-10-CM | POA: Insufficient documentation

## 2011-03-27 DIAGNOSIS — S065X9A Traumatic subdural hemorrhage with loss of consciousness of unspecified duration, initial encounter: Secondary | ICD-10-CM

## 2011-03-27 DIAGNOSIS — I4891 Unspecified atrial fibrillation: Secondary | ICD-10-CM | POA: Insufficient documentation

## 2011-03-27 DIAGNOSIS — I480 Paroxysmal atrial fibrillation: Secondary | ICD-10-CM

## 2011-03-27 NOTE — Progress Notes (Signed)
Subjective:    Patient ID: [name], female    DOB: 1940/07/07, 71 y.o.   MRN: [id number]  HPI [name] is back regarding her right subdural hemorrhage and craniotomy. She is back to her old routine at home.  She goes out and works in the yard.  No problems with balance. No new issues with breathing or her heart.  She doesn't have any problems with her stamina.  No longer is using her inhaler. She feels that her cognition is close to baseline.  She occasionally loses track when she has a lot of projects going on, but otherwise she feels she's doing well.  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    She does report an occasional headache which is very mild.   Pain Inventory Average Pain 0 Pain Right Now 0 My pain is N/a  In the last 24 hours, has pain interfered with the following? General activity 0 Relation with others 0 Enjoyment of life 0 What TIME of day is your pain at its worst? night Sleep (in general) Fair  Pain is worse with: N/A Pain improves with: N/A Relief from Meds: 0  Mobility walk without assistance ability to climb steps?  yes do you drive?  yes  Function retired  Neuro/Psych No problems in this area  Prior Studies Any changes since last visit?  no  Physicians involved in your care Any changes since last visit?  no   Review of Systems  Constitutional: Negative.   HENT: Negative.   Eyes: Negative.   Respiratory: Positive for shortness of breath.   Cardiovascular: Negative.   Gastrointestinal: Negative.   Genitourinary: Negative.   Musculoskeletal: Negative.   Skin: Negative.   Neurological: Negative.   Hematological: Negative.   Psychiatric/Behavioral: Negative.        Objective:   Physical Exam  Constitutional: She is oriented to person, place, and time. She appears well-developed.  HENT:  Head: Normocephalic.  Eyes: EOM are normal. Pupils are equal, round, and reactive to  light.  Neck: Normal range of motion.  Cardiovascular: Normal rate.   Pulmonary/Chest: Effort normal.  Abdominal: Soft. Bowel sounds are normal.  Musculoskeletal: Normal range of motion.  Neurological: She is alert and oriented to person, place, and time. She has normal strength and normal reflexes. No cranial nerve deficit or sensory deficit. She displays a negative Romberg sign. She displays no Babinski's sign on the right side. She displays no Babinski's sign on the left side.       Gait normal.  Did have mild difficulties with heel to toe gait.    Skin: Skin is warm.  Psychiatric: She has a normal mood and affect. Her speech is normal and behavior is normal. Judgment and thought content normal. Cognition and memory are normal.       Good insight and awareness. Functional memory and reasoning.           Assessment & Plan:  ASSESSMENT:  1. Right subdural hemorrhage requiring craniotomy.  2. History of postoperative seizures.  3. Atrial fibrillation.  PLAN:  1.She is permitted to go back to work. She is only working part time up to 7 or 8 hours a week maximum.  She needs to be careful with lifting anything greater than 10lbs. She should take rest breaks as needed..  2. She can return to driving once she takes 2 or 3 trials with family. 3. Wean off  keppra 4. She may follow up with me PRN.

## 2011-03-27 NOTE — Patient Instructions (Signed)
Keppra taper: take 1 tablet twice daily for 2 weeks, then take 1 tablet at bedtime for 2 weeks, then take 1/2 tab at bedtime for 2 weeks then stop.   You may return to work as tolerated as of today.  Avoid heavy lifting.  Please practice driving 2 or 3 times with a family member before you start driving alone.

## 2011-04-06 ENCOUNTER — Encounter: Payer: Self-pay | Admitting: Physical Medicine & Rehabilitation

## 2011-04-20 ENCOUNTER — Encounter: Payer: Self-pay | Admitting: Physical Medicine & Rehabilitation

## 2011-05-22 ENCOUNTER — Encounter (HOSPITAL_COMMUNITY): Payer: Self-pay

## 2011-05-22 ENCOUNTER — Emergency Department (HOSPITAL_COMMUNITY): Payer: Medicare Other

## 2011-05-22 ENCOUNTER — Inpatient Hospital Stay (HOSPITAL_COMMUNITY)
Admission: EM | Admit: 2011-05-22 | Discharge: 2011-05-23 | DRG: 101 | Disposition: A | Discharge status: Home / Self Care | Payer: Medicare Other | Attending: Family Medicine | Admitting: Family Medicine

## 2011-05-22 DIAGNOSIS — I4891 Unspecified atrial fibrillation: Secondary | ICD-10-CM

## 2011-05-22 DIAGNOSIS — I509 Heart failure, unspecified: Secondary | ICD-10-CM

## 2011-05-22 DIAGNOSIS — G40109 Localization-related (focal) (partial) symptomatic epilepsy and epileptic syndromes with simple partial seizures, not intractable, without status epilepticus: Principal | ICD-10-CM | POA: Diagnosis present

## 2011-05-22 DIAGNOSIS — R569 Unspecified convulsions: Secondary | ICD-10-CM

## 2011-05-22 DIAGNOSIS — S065X9A Traumatic subdural hemorrhage with loss of consciousness of unspecified duration, initial encounter: Secondary | ICD-10-CM

## 2011-05-22 DIAGNOSIS — I10 Essential (primary) hypertension: Secondary | ICD-10-CM

## 2011-05-22 DIAGNOSIS — I480 Paroxysmal atrial fibrillation: Secondary | ICD-10-CM | POA: Diagnosis present

## 2011-05-22 DIAGNOSIS — J449 Chronic obstructive pulmonary disease, unspecified: Secondary | ICD-10-CM

## 2011-05-22 DIAGNOSIS — J4489 Other specified chronic obstructive pulmonary disease: Secondary | ICD-10-CM | POA: Diagnosis present

## 2011-05-22 DIAGNOSIS — G40201 Localization-related (focal) (partial) symptomatic epilepsy and epileptic syndromes with complex partial seizures, not intractable, with status epilepticus: Secondary | ICD-10-CM

## 2011-05-22 DIAGNOSIS — I5022 Chronic systolic (congestive) heart failure: Secondary | ICD-10-CM

## 2011-05-22 DIAGNOSIS — E119 Type 2 diabetes mellitus without complications: Secondary | ICD-10-CM | POA: Diagnosis present

## 2011-05-22 DIAGNOSIS — Z95810 Presence of automatic (implantable) cardiac defibrillator: Secondary | ICD-10-CM

## 2011-05-22 DIAGNOSIS — S065XAA Traumatic subdural hemorrhage with loss of consciousness status unknown, initial encounter: Secondary | ICD-10-CM

## 2011-05-22 DIAGNOSIS — I428 Other cardiomyopathies: Secondary | ICD-10-CM

## 2011-05-22 DIAGNOSIS — E785 Hyperlipidemia, unspecified: Secondary | ICD-10-CM | POA: Diagnosis present

## 2011-05-22 LAB — DIFFERENTIAL
Basophils Relative: 0 % (ref 0–1)
Eosinophils Absolute: 0.1 10*3/uL (ref 0.0–0.7)
Eosinophils Relative: 2 % (ref 0–5)
Lymphocytes Relative: 23 % (ref 12–46)
Monocytes Relative: 7 % (ref 3–12)
Neutro Abs: 2.8 10*3/uL (ref 1.7–7.7)
Neutrophils Relative %: 68 % (ref 43–77)

## 2011-05-22 LAB — CBC
HCT: 37.1 % (ref 36.0–46.0)
Hemoglobin: 12.6 g/dL (ref 12.0–15.0)
RBC: 4.28 MIL/uL (ref 3.87–5.11)
WBC: 4.2 10*3/uL (ref 4.0–10.5)

## 2011-05-22 LAB — BASIC METABOLIC PANEL
BUN: 13 mg/dL (ref 6–23)
Chloride: 105 mEq/L (ref 96–112)
Creatinine, Ser: 0.84 mg/dL (ref 0.50–1.10)
GFR calc Af Amer: 79 mL/min — ABNORMAL LOW (ref >90)
GFR calc non Af Amer: 68 mL/min — ABNORMAL LOW (ref >90)
Potassium: 3.7 mEq/L (ref 3.5–5.1)

## 2011-05-22 LAB — ETHANOL: Alcohol, Ethyl (B): <11 mg/dL (ref 0–11)

## 2011-05-22 LAB — GLUCOSE, CAPILLARY: Glucose-Capillary: 101 mg/dL — ABNORMAL HIGH (ref 70–99)

## 2011-05-22 LAB — RAPID URINE DRUG SCREEN, HOSP PERFORMED
Amphetamines: NOT DETECTED
Opiates: NOT DETECTED

## 2011-05-22 MED ORDER — ONDANSETRON HCL 4 MG PO TABS: 4.0000 mg | ORAL_TABLET | Freq: Four times a day (QID) | ORAL | Status: DC | PRN

## 2011-05-22 MED ORDER — SODIUM CHLORIDE 0.9 % IJ SOLN: 3.0000 mL | INTRAMUSCULAR | Status: DC | PRN

## 2011-05-22 MED ORDER — POTASSIUM CHLORIDE CRYS ER 10 MEQ PO TBCR
10.0000 meq | EXTENDED_RELEASE_TABLET | Freq: Every day | ORAL | Status: DC
  Administered 2011-05-22: 10 meq via ORAL
  Filled 2011-05-22 (×3): qty 1

## 2011-05-22 MED ORDER — LEVETIRACETAM 750 MG PO TABS
750.0000 mg | ORAL_TABLET | Freq: Two times a day (BID) | ORAL | Status: DC
  Administered 2011-05-22 – 2011-05-23 (×2): 750 mg via ORAL
  Filled 2011-05-22 (×3): qty 1

## 2011-05-22 MED ORDER — LISINOPRIL 10 MG PO TABS
10.0000 mg | ORAL_TABLET | Freq: Every day | ORAL | Status: DC
  Administered 2011-05-22 – 2011-05-23 (×2): 10 mg via ORAL
  Filled 2011-05-22 (×2): qty 1

## 2011-05-22 MED ORDER — ACETAMINOPHEN 325 MG PO TABS: 650.0000 mg | ORAL_TABLET | Freq: Four times a day (QID) | ORAL | Status: DC | PRN

## 2011-05-22 MED ORDER — PANTOPRAZOLE SODIUM 40 MG PO TBEC
40.0000 mg | DELAYED_RELEASE_TABLET | Freq: Two times a day (BID) | ORAL | Status: DC
  Administered 2011-05-22 – 2011-05-23 (×2): 40 mg via ORAL
  Filled 2011-05-22 (×5): qty 1

## 2011-05-22 MED ORDER — SODIUM CHLORIDE 0.9 % IV BOLUS (SEPSIS)
250.0000 mL | Freq: Once | INTRAVENOUS | Status: AC
  Administered 2011-05-22: 23:00:00 via INTRAVENOUS

## 2011-05-22 MED ORDER — SODIUM CHLORIDE 0.9 % IV SOLN: 250.0000 mL | INTRAVENOUS | Status: DC | PRN

## 2011-05-22 MED ORDER — SIMVASTATIN 20 MG PO TABS
20.0000 mg | ORAL_TABLET | Freq: Every day | ORAL | Status: DC
  Administered 2011-05-22: 20 mg via ORAL
  Filled 2011-05-22 (×2): qty 1

## 2011-05-22 MED ORDER — LORAZEPAM 2 MG/ML IJ SOLN
1.0000 mg | Freq: Once | INTRAMUSCULAR | Status: AC
  Administered 2011-05-22: 1 mg via INTRAVENOUS
  Filled 2011-05-22: qty 1

## 2011-05-22 MED ORDER — ONDANSETRON HCL 4 MG/2ML IJ SOLN: 4.0000 mg | Freq: Four times a day (QID) | INTRAMUSCULAR | Status: DC | PRN

## 2011-05-22 MED ORDER — ALBUTEROL SULFATE (5 MG/ML) 0.5% IN NEBU: 2.5000 mg | INHALATION_SOLUTION | RESPIRATORY_TRACT | Status: DC | PRN

## 2011-05-22 MED ORDER — HYDROCODONE-ACETAMINOPHEN 5-325 MG PO TABS
1.0000 | ORAL_TABLET | Freq: Once | ORAL | Status: AC
  Administered 2011-05-22: 1 via ORAL
  Filled 2011-05-22: qty 1

## 2011-05-22 MED ORDER — CARVEDILOL 25 MG PO TABS
25.0000 mg | ORAL_TABLET | Freq: Two times a day (BID) | ORAL | Status: DC
  Administered 2011-05-22 – 2011-05-23 (×2): 25 mg via ORAL
  Filled 2011-05-22 (×4): qty 1

## 2011-05-22 MED ORDER — FUROSEMIDE 20 MG PO TABS
20.0000 mg | ORAL_TABLET | Freq: Every day | ORAL | Status: DC
Start: 2011-05-22 — End: 2011-05-23
  Administered 2011-05-22 – 2011-05-23 (×2): 20 mg via ORAL
  Filled 2011-05-22 (×2): qty 1

## 2011-05-22 MED ORDER — SODIUM CHLORIDE 0.9 % IV SOLN
1500.0000 mg | Freq: Two times a day (BID) | INTRAVENOUS | Status: DC
  Administered 2011-05-22: 1500 mg via INTRAVENOUS
  Filled 2011-05-22 (×2): qty 15

## 2011-05-22 MED ORDER — SODIUM CHLORIDE 0.9 % IV SOLN
1000.0000 mg | Freq: Once | INTRAVENOUS | Status: AC
  Administered 2011-05-22: 1000 mg via INTRAVENOUS
  Filled 2011-05-22: qty 20

## 2011-05-22 MED ORDER — FERROUS SULFATE 325 (65 FE) MG PO TABS
325.0000 mg | ORAL_TABLET | Freq: Two times a day (BID) | ORAL | Status: DC
  Administered 2011-05-22 – 2011-05-23 (×2): 325 mg via ORAL
  Filled 2011-05-22 (×4): qty 1

## 2011-05-22 MED ORDER — ACETAMINOPHEN 650 MG RE SUPP: 650.0000 mg | Freq: Four times a day (QID) | RECTAL | Status: DC | PRN

## 2011-05-22 MED ORDER — SODIUM CHLORIDE 0.9 % IJ SOLN
3.0000 mL | Freq: Two times a day (BID) | INTRAMUSCULAR | Status: DC
  Administered 2011-05-23: 3 mL via INTRAVENOUS

## 2011-05-22 NOTE — ED Provider Notes (Signed)
History     CSN: [id number]  Arrival date & time 05/22/11  [id number]   First MD Initiated Contact with Patient 05/22/11 [phone]      Chief Complaint  Patient presents with  . Seizures    (Consider location/radiation/quality/duration/timing/severity/associated sxs/prior treatment) HPI Comments: [name] is a 71 y.o. Female who reports left-sided seizure since last night. She is alert and able to give history. She had transient seizure earlier yesterday, that resolved spontaneously. She has been on seizure medicines in the past, but was taken off  Keppra in February 2013.   The history is provided by the patient.    Past Medical History  Diagnosis Date  . NICM (nonischemic cardiomyopathy)     itial EF of 35%. Normal in 07/2009; 35% 12/12  . COPD (chronic obstructive pulmonary disease)   . DM2 (diabetes mellitus, type 2)   . HLD (hyperlipidemia)   . Vitamin d deficiency   . Chronic systolic heart failure        . PAF (paroxysmal atrial fibrillation)   . Hypertension   . Subdural hematoma     12/12    . 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  s/p revision 2/2 ERI 02/28/2011 - [facility] CD 3257    Past Surgical History  Procedure Date  . Cardiac defibrillator placement 2006  . Total abdominal hysterectomy 1983  . Cardiac catheterization     no significant CAD  . Brain hematoma evacutation     Family History  Problem Relation Age of Onset  . Heart disease      History  Substance Use Topics  . Smoking status: Former Smoker    Quit date: 01/17/1968  . Smokeless tobacco: Never Used  . Alcohol Use: No    OB History    Grav Para Term Preterm Abortions TAB SAB Ect Mult Living                  Review of Systems  All other systems reviewed and are negative.    Allergies  Codeine; Lactose intolerance (gi); Morphine and related; and Valium  Home Medications   Current Outpatient Rx  Name Route Sig Dispense Refill  . ALBUTEROL SULFATE HFA  108 (90 BASE) MCG/ACT IN AERS Inhalation Inhale 2 puffs into the lungs every 6 (six) hours as needed. For wheezing    . CARVEDILOL 25 MG PO TABS Oral Take 25 mg by mouth 2 (two) times daily with a meal.      . ERGOCALCIFEROL 50000 UNITS PO CAPS Oral Take 50,000 Units by mouth 2 (two) times a week. On Monday and Friday    . FENOFIBRATE 145 MG PO TABS Oral Take 145 mg by mouth daily.    [name] FERROUS SULFATE 325 (65 FE) MG PO TABS Oral Take 325 mg by mouth 2 (two) times daily with a meal.    . FUROSEMIDE 20 MG PO TABS Oral Take 20 mg by mouth daily. Take every other day (with potassium)    . LEVETIRACETAM 750 MG PO TABS Oral Take 750 mg by mouth every 12 (twelve) hours.    [name] PANTOPRAZOLE SODIUM 40 MG PO TBEC Oral Take 40 mg by mouth 2 (two) times daily before a meal.    . POTASSIUM CHLORIDE CRYS ER 10 MEQ PO TBCR Oral Take 10 mEq by mouth daily. Take every other day with lasix (furosemide)    . QUINAPRIL HCL 10 MG PO TABS Oral Take 10 mg by mouth  at bedtime.    [name] SIMVASTATIN 40 MG PO TABS Oral Take 20 mg by mouth at bedtime.     [name] SITAGLIPTIN PHOSPHATE 100 MG PO TABS Oral Take 100 mg by mouth daily.        BP 137/54  Pulse 74  Temp(Src) 98.3 F (36.8 C) (Tympanic)  Resp 18  SpO2 100%  Physical Exam  Nursing note and vitals reviewed. Constitutional: She is oriented to person, place, and time. She appears well-developed and well-nourished.  HENT:  Head: Normocephalic and atraumatic.  Eyes: Conjunctivae and EOM are normal. Pupils are equal, round, and reactive to light.  Neck: Normal range of motion and phonation normal. Neck supple.  Cardiovascular: Normal rate, regular rhythm and intact distal pulses.   Pulmonary/Chest: Effort normal and breath sounds normal. She exhibits no tenderness.  Abdominal: Soft. She exhibits no distension. There is no tenderness. There is no guarding.  Musculoskeletal: Normal range of motion.  Neurological: She is alert and oriented to person, place, and time. She  has normal strength. She exhibits normal muscle tone.       Rhythmic focal twitching, left face, and left arm. Left leg is spared.  Skin: Skin is warm and dry.  Psychiatric: She has a normal mood and affect. Her behavior is normal. Judgment and thought content normal.    ED Course  Procedures (including critical care time)  Initial treatment: Ativan 1 mg IV  and Dilantin load ordered.  Additional Ativan given for persistant seizure.   IV Keppra Load given  Discussed case with on-call Neurologist.  Admit to Hospitalist.  Labs Reviewed  BASIC METABOLIC PANEL - Abnormal; Notable for the following:    GFR calc non Af Amer 68 (*)    GFR calc Af Amer 79 (*)    All other components within normal limits  GLUCOSE, CAPILLARY - Abnormal; Notable for the following:    Glucose-Capillary 101 (*)    All other components within normal limits  CBC - Abnormal; Notable for the following:    Platelets 101 (*)    All other components within normal limits  GLUCOSE, CAPILLARY - Abnormal; Notable for the following:    Glucose-Capillary 124 (*)    All other components within normal limits  URINE RAPID DRUG SCREEN (HOSP PERFORMED)  ETHANOL  DIFFERENTIAL  CBC  BASIC METABOLIC PANEL   Ct Head Wo Contrast  05/22/2011  *RADIOLOGY REPORT*  Clinical Data: Prior surgery for extra-axial hematoma, now with seizures.  CT HEAD WITHOUT CONTRAST  Technique:  Contiguous axial images were obtained from the base of the skull through the vertex without contrast.  Comparison: Original scan 12/23/2010.  Most recent followup scan 05/19/2011.  Findings: There is no evidence for acute stroke, intracranial hemorrhage, mass lesion, hydrocephalus, or extra-axial fluid. There is an uncomplicated right frontal craniotomy defect with slight thickening and calcification of the underlying dura status post surgical evacuation of a right frontal extra-axial hematoma. There is no residual fluid collection or midline shift.  The  underlying brain is relatively unremarkable.  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  There is mild chronic microvascular ischemic change. Mild vascular calcification is noted.  Slight left mastoid fluid nonspecific.  Compared with 05/19/2011 the appearance is similar.  IMPRESSION: Postsurgical changes right frontal region status post successful treatment of an extra-axial hematoma.  No evidence for recurrent subdural or epidural fluid collection. No acute stroke or bleed. Mild chronic changes as described.  Original Report Authenticated  By: Staci Righter, M.D.    Date: 05/22/2011  Rate: 94  Rhythm: indeterminate  QRS Axis: normal  Intervals: prolonged QT  ST/T Wave abnormalities: NA  Conduction Disutrbances:NA  Narrative Interpretation:   Old EKG Reviewed: changes noted  CRITICAL CARE Performed by: Daleen Bo L   Total critical care time: 50 minutes Critical care time was exclusive of separately billable procedures and treating other patients.  Critical care was necessary to treat or prevent imminent or life-threatening deterioration.  Critical care was time spent personally by me on the following activities: development of treatment plan with patient and/or surrogate as well as nursing, discussions with consultants, evaluation of patient's response to treatment, examination of patient, obtaining history from patient or surrogate, ordering and performing treatments and interventions, ordering and review of laboratory studies, ordering and review of radiographic studies, pulse oximetry and re-evaluation of patient's condition    . 1. Seizure       MDM  Seizure after stopping Keppra. Pt needs to be restarted on anti-epileptics.  Will need to be admitted for stablization         Richarda Blade, MD 05/23/11 [phone]

## 2011-05-22 NOTE — ED Notes (Signed)
[id number] Expected date:<BR> Expected time:<BR> Means of arrival:<BR> Comments:<BR> ems

## 2011-05-22 NOTE — H&P (Signed)
PCP:   Bonnita Nasuti, MD, MD   Chief Complaint:  Seizure (3 since Friday)  HPI: Patient is a 71 y/o with history of seizure and Neurosurgical operation in December 2012 after a fall.  Is presenting to the ED complaining of persistent seizure.  Husband is at bedside and is providing history because patient is somnolent after receiving Ativan and Dilantin.  States that patient was placed on antiepileptic medication which they tried to wean the patient off of in February.  Last Friday patient was seen in [place] because she had developed a seizure.  Was discharged on the same day with a script for Keppra.  Which husband states that he was not able to fill and thus patient did not receive medication.  Early this morning patient reportedly had another seizure per family.  Husband states "she was twitching."  According to discussion with ED doctor patient had persistent seizure like activity and there was concern for status epilepticus.  She was given in the ED ativan, keppra, and phenytoin.  Currently the patient is laying in bed and there is no seizure like activity.  In the ED patient had BMP and CBC which were WNL except for a low platelet count of 101.  Reportedly ED physician spoke to Neurology that felt like medical management would be appropriate for observation overnight and continuation of her home regimen of Keppra.  Allergies:   Allergies  Allergen Reactions  . Codeine Other (See Comments)    "puts me on a trip"  . Lactose Intolerance (Gi) Diarrhea  . Morphine And Related   . Valium Hives and Rash      Past Medical History  Diagnosis Date  . NICM (nonischemic cardiomyopathy)     itial EF of 35%. Normal in 07/2009; 35% 12/12  . COPD (chronic obstructive pulmonary disease)   . DM2 (diabetes mellitus, type 2)   . HLD (hyperlipidemia)   . Vitamin d deficiency   . Chronic systolic heart failure        . PAF (paroxysmal atrial fibrillation)   . Hypertension   . Subdural hematoma       12/12    . 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  s/p revision 2/2 ERI 02/28/2011 - [facility] CD 3257    Past Surgical History  Procedure Date  . Cardiac defibrillator placement 2006  . Total abdominal hysterectomy 1983  . Cardiac catheterization     no significant CAD  . Brain hematoma evacutation     Prior to Admission medications   Medication Sig Start Date End Date Taking? Authorizing Provider  albuterol (PROVENTIL HFA;VENTOLIN HFA) 108 (90 BASE) MCG/ACT inhaler Inhale 2 puffs into the lungs every 6 (six) hours as needed. For wheezing   Yes Historical Provider, MD  carvedilol (COREG) 25 MG tablet Take 25 mg by mouth 2 (two) times daily with a meal.     Yes Historical Provider, MD  ergocalciferol (VITAMIN D2) 50000 UNITS capsule Take 50,000 Units by mouth 2 (two) times a week. On Monday and Friday   Yes Historical Provider, MD  fenofibrate (TRICOR) 145 MG tablet Take 145 mg by mouth daily.   Yes Historical Provider, MD  ferrous sulfate 325 (65 FE) MG tablet Take 325 mg by mouth 2 (two) times daily with a meal. 01/09/11 01/09/12 Yes Ivan Anchors Love, PA  furosemide (LASIX) 20 MG tablet Take 20 mg by mouth daily. Take every other day (with potassium) 02/21/11  Yes Remo Lipps  Peterson Lombard, MD  levETIRAcetam (KEPPRA) 750 MG tablet Take 750 mg by mouth every 12 (twelve) hours.   Yes Historical Provider, MD  pantoprazole (PROTONIX) 40 MG tablet Take 40 mg by mouth 2 (two) times daily before a meal. 01/09/11 01/09/12 Yes Ivan Anchors Love, PA  potassium chloride (K-DUR,KLOR-CON) 10 MEQ tablet Take 10 mEq by mouth daily. Take every other day with lasix (furosemide) 02/21/11  Yes Deboraha Sprang, MD  quinapril (ACCUPRIL) 10 MG tablet Take 10 mg by mouth at bedtime. 09/01/10  Yes Wellington Hampshire, MD  simvastatin (ZOCOR) 40 MG tablet Take 20 mg by mouth at bedtime.    Yes Historical Provider, MD  sitaGLIPtan (JANUVIA) 100 MG tablet Take 100 mg by mouth daily.     Yes Historical Provider,  MD    Social History:  reports that she quit smoking about 43 years ago. She has never used smokeless tobacco. She reports that she does not drink alcohol or use illicit drugs.  Family History  Problem Relation Age of Onset  . Heart disease      Review of Systems:  Unable to properly assess due to current patient condition and somnolence secondary to recent medication administration.   Physical Exam: Blood pressure 151/62, pulse 79, temperature 98.3 F (36.8 C), temperature source Tympanic, resp. rate 19, SpO2 99.00%. General: Patient is tearful states "please don't hurt me", currently in NAD, no seizure like activity noted. HEENT: atraumatic, normocephalic, No bruits, no goiter. Heart: Regular rate and rhythm, without murmurs, rubs, gallops. Lungs: Clear to auscultation bilaterally. Abdomen: Soft, nontender, nondistended, positive bowel sounds. Extremities: No clubbing cyanosis or edema with positive pedal pulses. Neuro: Patient is somnolent but arousable.  Unable to interact with physician well.    Labs on Admission:  Results for orders placed during the hospital encounter of 05/22/11 (from the past 48 hour(s))  BASIC METABOLIC PANEL     Status: Abnormal   Collection Time   05/22/11 10:30 AM      Component Value Range Comment   Sodium 141  135 - 145 (mEq/L)    Potassium 3.7  3.5 - 5.1 (mEq/L)    Chloride 105  96 - 112 (mEq/L)    CO2 24  19 - 32 (mEq/L)    Glucose, Bld 99  70 - 99 (mg/dL)    BUN 13  6 - 23 (mg/dL)    Creatinine, Ser 0.84  0.50 - 1.10 (mg/dL)    Calcium 9.2  8.4 - 10.5 (mg/dL)    GFR calc non Af Amer 68 (*) >90 (mL/min)    GFR calc Af Amer 79 (*) >90 (mL/min)   ETHANOL     Status: Normal   Collection Time   05/22/11 10:30 AM      Component Value Range Comment   Alcohol, Ethyl (B) <11  0 - 11 (mg/dL)   GLUCOSE, CAPILLARY     Status: Abnormal   Collection Time   05/22/11 10:51 AM      Component Value Range Comment   Glucose-Capillary 101 (*) 70 - 99  (mg/dL)   CBC     Status: Abnormal   Collection Time   05/22/11 11:58 AM      Component Value Range Comment   WBC 4.2  4.0 - 10.5 (K/uL)    RBC 4.28  3.87 - 5.11 (MIL/uL)    Hemoglobin 12.6  12.0 - 15.0 (g/dL)    HCT 37.1  36.0 - 46.0 (%)    MCV 86.7  78.0 - 100.0 (fL)    MCH 29.4  26.0 - 34.0 (pg)    MCHC 34.0  30.0 - 36.0 (g/dL)    RDW 13.0  11.5 - 15.5 (%)    Platelets 101 (*) 150 - 400 (K/uL)   DIFFERENTIAL     Status: Normal   Collection Time   05/22/11 11:58 AM      Component Value Range Comment   Neutrophils Relative 68  43 - 77 (%)    Lymphocytes Relative 23  12 - 46 (%)    Monocytes Relative 7  3 - 12 (%)    Eosinophils Relative 2  0 - 5 (%)    Basophils Relative 0  0 - 1 (%)    Neutro Abs 2.8  1.7 - 7.7 (K/uL)    Lymphs Abs 1.0  0.7 - 4.0 (K/uL)    Monocytes Absolute 0.3  0.1 - 1.0 (K/uL)    Eosinophils Absolute 0.1  0.0 - 0.7 (K/uL)    Basophils Absolute 0.0  0.0 - 0.1 (K/uL)    RBC Morphology POLYCHROMASIA PRESENT      Smear Review LARGE PLATELETS PRESENT     URINE RAPID DRUG SCREEN (HOSP PERFORMED)     Status: Normal   Collection Time   05/22/11 12:27 PM      Component Value Range Comment   Opiates NONE DETECTED  NONE DETECTED     Cocaine NONE DETECTED  NONE DETECTED     Benzodiazepines NONE DETECTED  NONE DETECTED     Amphetamines NONE DETECTED  NONE DETECTED     Tetrahydrocannabinol NONE DETECTED  NONE DETECTED     Barbiturates NONE DETECTED  NONE DETECTED      Radiological Exams on Admission: Ct Head Wo Contrast  05/22/2011  *RADIOLOGY REPORT*  Clinical Data: Prior surgery for extra-axial hematoma, now with seizures.  CT HEAD WITHOUT CONTRAST  Technique:  Contiguous axial images were obtained from the base of the skull through the vertex without contrast.  Comparison: Original scan 12/23/2010.  Most recent followup scan 05/19/2011.  Findings: There is no evidence for acute stroke, intracranial hemorrhage, mass lesion, hydrocephalus, or extra-axial fluid. There is  an uncomplicated right frontal craniotomy defect with slight thickening and calcification of the underlying dura status post surgical evacuation of a right frontal extra-axial hematoma. There is no residual fluid collection or midline shift.  The underlying brain is relatively unremarkable.  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  There is mild chronic microvascular ischemic change. Mild vascular calcification is noted.  Slight left mastoid fluid nonspecific.  Compared with 05/19/2011 the appearance is similar.  IMPRESSION: Postsurgical changes right frontal region status post successful treatment of an extra-axial hematoma.  No evidence for recurrent subdural or epidural fluid collection. No acute stroke or bleed. Mild chronic changes as described.  Original Report Authenticated By: Staci Righter, M.D.    Assessment/Plan Principal Problem:  *Seizure -At this point will plan on continuing Keppra at recommended home regimen dose.  Patient has already received a loading dose in the ED.  Will monitor overnight and watch for any breakthrough seizure like activity.  Will consider Ativan should patient have any breakthrough seizures.  Will have her follow up with her neurologist once ready for discharge.  Active Problems:  PAF (paroxysmal atrial fibrillation) - Stable at this juncture.  Will plan on continuing home regimen.  This patient is not a candidate for anticoagulation given her history of falls  and recent seizures.   Hypertension - Has ranged from 113/94 to 157/89.  At this point will plan on continuing lisinopril and B blocker.   Chronic systolic heart failure -Will continue beta blocker, lisinopril, and lasix  HPL: -Continue statin at this point will hold fenofibrate while patient is in house.   COPD:  -Stable at this juncture.  Will order albuterol nebs prn  DM 2:   -Will hold Januvia and place on insulin sliding scale while in house.   Monitor blood sugars and diabetic diet.  Disposition:  Should patient remain seizure free on home regimen on Keppra will plan on discharging soon with follow up to see her neurologist.  Will consider consulting neurology if patient's condition deteriorates.  Time Spent on Admission: 45 min obtaining history, physical exam, discussion with ED doctor, reviewing chart, placing orders, updating information systems, billing.  Velvet Bathe [facility] Pager: [phone] 05/22/2011, 3:11 PM

## 2011-05-22 NOTE — ED Notes (Signed)
Per EMS, pt had a fall in December 2012, suffered head injury, removed blood clot. Pt was put on seizure medications but was not definitive diagnosis of seizures. Pt has been off seizure medications since February 2013. Pt told EMS that the episode lasted all night.pt has left arm and left neck tic/tremor.

## 2011-05-22 NOTE — ED Notes (Signed)
Pt. Not drinking fluids.  Paged MD to get fluid order.

## 2011-05-23 DIAGNOSIS — I509 Heart failure, unspecified: Secondary | ICD-10-CM

## 2011-05-23 DIAGNOSIS — I10 Essential (primary) hypertension: Secondary | ICD-10-CM

## 2011-05-23 DIAGNOSIS — G40109 Localization-related (focal) (partial) symptomatic epilepsy and epileptic syndromes with simple partial seizures, not intractable, without status epilepticus: Secondary | ICD-10-CM

## 2011-05-23 DIAGNOSIS — I4891 Unspecified atrial fibrillation: Secondary | ICD-10-CM

## 2011-05-23 LAB — CBC
HCT: 34.3 % — ABNORMAL LOW (ref 36.0–46.0)
Hemoglobin: 11.6 g/dL — ABNORMAL LOW (ref 12.0–15.0)
MCH: 29.7 pg (ref 26.0–34.0)
MCHC: 33.8 g/dL (ref 30.0–36.0)

## 2011-05-23 LAB — BASIC METABOLIC PANEL
BUN: 13 mg/dL (ref 6–23)
Calcium: 9 mg/dL (ref 8.4–10.5)
GFR calc non Af Amer: 67 mL/min — ABNORMAL LOW (ref >90)
Glucose, Bld: 99 mg/dL (ref 70–99)
Potassium: 3.2 mEq/L — ABNORMAL LOW (ref 3.5–5.1)

## 2011-05-23 MED ORDER — POTASSIUM CHLORIDE CRYS ER 20 MEQ PO TBCR
40.0000 meq | EXTENDED_RELEASE_TABLET | Freq: Once | ORAL | Status: AC
  Administered 2011-05-23: 40 meq via ORAL
  Filled 2011-05-23: qty 2

## 2011-05-23 NOTE — Progress Notes (Signed)
05-23-11 Spoke with Mrs Alvardo and husband at bedside. Offered H[facility] choice and [facility] was chosen. Gave patient and husband a [facility] card. Debbie with [facility] made aware of referral.  Marthenia Rolling, RN,BSN,CM  [phone]

## 2011-05-23 NOTE — Discharge Summary (Signed)
Physician Discharge Summary  [name] [id number] DOB: [birth year] DOA: 05/22/2011  PCP: Bonnita Nasuti, MD, MD  Admit date: 05/22/2011 Discharge date: 05/23/2011  Discharge Diagnoses:  1. Partial seizure 2. COPD 3. CHF systolic 4. H/o SDH 5. PAF (paroxysmal atrial fibrillation)  Discharge Condition: stable  Disposition: Home with home health and nursing  History of present illness:  Patient is a 71 y/o with history of seizure and Neurosurgical operation in December 2012 after a fall. Is presenting to the ED complaining of persistent seizure. Husband is at bedside and is providing history because patient is somnolent after receiving Ativan and Dilantin. States that patient was placed on antiepileptic medication which they tried to wean the patient off of in February. Last Friday patient was seen in [place] because she had developed a seizure. Was discharged on the same day with a script for Keppra. Which husband states that he was not able to fill and thus patient did not receive medication.   Hospital Course:  Patient in the ED was given phenytoin, ativan, and loading dose of Keppra.  Her seizures ceased.  Has been switched to her home dose of Keppra and has not had any further seizure activity.  Will plan on continuing home regimen of Keppra 750 mg every 12 hours.  Patient is to follow up with her neurologist as indicated below.  Family understands and verbalizes agreement.  Have indicated that Keppra has been called to the pharmacy and is ready to be picked up.  They indicate that they will do so.    Discharge Exam: Filed Vitals:   05/23/11 1435  BP: 115/47  Pulse: 71  Temp: 98.1 F (36.7 C)  Resp: 16   Filed Vitals:   05/22/11 2251 05/23/11 0029 05/23/11 0619 05/23/11 1435  BP: 87/50 110/65 126/69 115/47  Pulse: 70 69 71 71  Temp: 98.6 F (37 C) 98.1 F (36.7 C) 98.5 F (36.9 C) 98.1 F (36.7 C)  TempSrc: Axillary Axillary Axillary Oral  Resp: 18 17 17 16   Height: 5\' 6"   (1.676 m)     Weight: 61.6 kg (135 lb 12.9 oz)     SpO2: 98% 99% 100% 95%   General: Alert, awake, oriented x3, in no acute distress. HEENT: No bruits, no goiter. Heart: Regular rate and rhythm, without murmurs, rubs, gallops. Lungs: Clear to auscultation bilaterally. Abdomen: Soft, nontender, nondistended, positive bowel sounds. Extremities: No clubbing cyanosis or edema with positive pedal pulses. Neuro: No seizure like activity, patient responds to questions appropriately   Discharge Instructions  Discharge Orders    Future Appointments: Provider: Department: Dept Phone: Center:   06/13/2011 9:00 AM Deboraha Sprang, [place] [phone] [id number]     Future Orders Please Complete By Expires   Diet - low sodium heart healthy      Increase activity slowly      Discharge instructions      Comments:   Patient to go home with home health nurse and physical therapy.  Please take your Keppra as indicated for seizure prevention.  Also be sure to follow up with your neurologist in 2-3 weeks or sooner should there be any other concerns.   Call MD for:  persistant dizziness or light-headedness      Call MD for:  extreme fatigue        Medication List  As of 05/23/2011  2:48 PM   STOP taking these medications         fenofibrate 145 MG tablet  TAKE these medications         albuterol 108 (90 BASE) MCG/ACT inhaler   Commonly known as: PROVENTIL HFA;VENTOLIN HFA   Inhale 2 puffs into the lungs every 6 (six) hours as needed. For wheezing      carvedilol 25 MG tablet   Commonly known as: COREG   Take 25 mg by mouth 2 (two) times daily with a meal.      ergocalciferol 50000 UNITS capsule   Commonly known as: VITAMIN D2   Take 50,000 Units by mouth 2 (two) times a week. On Monday and Friday      ferrous sulfate 325 (65 FE) MG tablet   Take 325 mg by mouth 2 (two) times daily with a meal.      furosemide 20 MG tablet   Commonly known as: LASIX   Take 20 mg  by mouth daily. Take every other day (with potassium)      levETIRAcetam 750 MG tablet   Commonly known as: KEPPRA   Take 750 mg by mouth every 12 (twelve) hours.      pantoprazole 40 MG tablet   Commonly known as: PROTONIX   Take 40 mg by mouth 2 (two) times daily before a meal.      potassium chloride 10 MEQ tablet   Commonly known as: K-DUR,KLOR-CON   Take 10 mEq by mouth daily. Take every other day with lasix (furosemide)      quinapril 10 MG tablet   Commonly known as: ACCUPRIL   Take 10 mg by mouth at bedtime.      simvastatin 40 MG tablet   Commonly known as: ZOCOR   Take 20 mg by mouth at bedtime.      sitaGLIPtin 100 MG tablet   Commonly known as: JANUVIA   Take 100 mg by mouth daily.              The results of significant diagnostics from this hospitalization (including imaging, microbiology, ancillary and laboratory) are listed below for reference.    Significant Diagnostic Studies: Ct Head Wo Contrast  05/22/2011  *RADIOLOGY REPORT*  Clinical Data: Prior surgery for extra-axial hematoma, now with seizures.  CT HEAD WITHOUT CONTRAST  Technique:  Contiguous axial images were obtained from the base of the skull through the vertex without contrast.  Comparison: Original scan 12/23/2010.  Most recent followup scan 05/19/2011.  Findings: There is no evidence for acute stroke, intracranial hemorrhage, mass lesion, hydrocephalus, or extra-axial fluid. There is an uncomplicated right frontal craniotomy defect with slight thickening and calcification of the underlying dura status post surgical evacuation of a right frontal extra-axial hematoma. There is no residual fluid collection or midline shift.  The underlying brain is relatively unremarkable.  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  There is mild chronic microvascular ischemic change. Mild vascular calcification is noted.  Slight left mastoid fluid nonspecific.  Compared  with 05/19/2011 the appearance is similar.  IMPRESSION: Postsurgical changes right frontal region status post successful treatment of an extra-axial hematoma.  No evidence for recurrent subdural or epidural fluid collection. No acute stroke or bleed. Mild chronic changes as described.  Original Report Authenticated By: Staci Righter, M.D.    Microbiology: No results found for this or any previous visit (from the past 240 hour(s)).   Labs: Basic Metabolic Panel:  Lab [id number] 0500 05/22/11 1030  NA 141 141  K 3.2* 3.7  CL 105 105  CO2 25 24  GLUCOSE 99 99  BUN 13 13  CREATININE 0.85 0.84  CALCIUM 9.0 9.2  MG -- --  PHOS -- --   Liver Function Tests: No results found for this basename: AST:5,ALT:5,ALKPHOS:5,BILITOT:5,PROT:5,ALBUMIN:5 in the last 168 hours No results found for this basename: LIPASE:5,AMYLASE:5 in the last 168 hours No results found for this basename: AMMONIA:5 in the last 168 hours CBC:  Lab 05/23/11 0500 05/22/11 1158  WBC 3.1* 4.2  NEUTROABS -- 2.8  HGB 11.6* 12.6  HCT 34.3* 37.1  MCV 87.7 86.7  PLT 117* 101*   Cardiac Enzymes: No results found for this basename: CKTOTAL:5,CKMB:5,CKMBINDEX:5,TROPONINI:5 in the last 168 hours BNP: No components found with this basename: POCBNP:5 CBG:  Lab 05/22/11 2005 05/22/11 1051  GLUCAP 124* 101*    Time coordinating discharge: > 55 minutes  Signed:  Velvet Bathe, MD  [facility] 05/23/2011, 2:48 PM

## 2011-05-23 NOTE — Evaluation (Signed)
Physical Therapy Evaluation One time eval and D/C from acute PT  Patient Details Name: [name] MRN: [id number] DOB: 03/13/1940 Today's Date: 05/23/2011 Time: [id number] PT Time Calculation (min): 13 min  PT Assessment / Plan / Recommendation Clinical Impression  Pt admitted for seizures.  Pt and spouse report pt falls at least once a day due to seizures.  Pt did well upon evaluation with a RW.  Encouraged pt to continue using RW at home (as spouse reports pt also occasionally falls when she doesn't use her RW).  Pt reports she is familar with therapy as she had HHPT after her cranial surgery and reports she remembers what they taught her  and she declines further HHPT upon d/c.    PT Assessment  Patent does not need any further PT services    Follow Up Recommendations  No PT follow up (pt declines HHPT)    Equipment Recommendations  None recommended by PT    Frequency      Precautions / Restrictions Precautions Precautions: Fall Precaution Comments: seizure precautions   Pertinent Vitals/Pain       Mobility  Bed Mobility Bed Mobility: Supine to Sit;Sit to Supine Supine to Sit: 6: Modified independent (Device/Increase time) Sit to Supine: 6: Modified independent (Device/Increase time) Transfers Transfers: Stand to Sit;Sit to Stand Sit to Stand: 5: Supervision;From bed Stand to Sit: 5: Supervision;To bed Details for Transfer Assistance: verbal cues for hand placement Ambulation/Gait Ambulation/Gait Assistance: 4: Min guard;5: Supervision Ambulation Distance (Feet): 160 Feet Assistive device: Rolling walker Ambulation/Gait Assistance Details: no unsteadiness or LOB with RW, antalgic type gait since pt reports L side weaker than R since cranial surgery Gait Pattern: Step-through pattern;Antalgic Gait velocity: decreased    Exercises     PT Goals    Visit Information  Last PT Received On: 05/23/11 Assistance Needed: +1    Subjective Data  Subjective: "I'm going  to take a nap."  (after therapy)   Prior Functioning  Home Living Lives With: Spouse Type of Home: House Home Access: Ramped entrance Home Layout: One level [place]: Walker - rolling;Straight cane Prior Function Level of Independence: Independent with assistive device(s)    Cognition  Overall Cognitive Status: Appears within functional limits for tasks assessed/performed Arousal/Alertness: Awake/alert Orientation Level: Appears intact for tasks assessed Behavior During Session: [facility] for tasks performed    Extremity/Trunk Assessment Right Upper Extremity Assessment RUE ROM/Strength/Tone: [facility] for tasks assessed Left Upper Extremity Assessment LUE ROM/Strength/Tone: Deficits LUE ROM/Strength/Tone Deficits: pt reports L side weaker than R since cranial surgery Right Lower Extremity Assessment RLE ROM/Strength/Tone: WFL for tasks assessed Left Lower Extremity Assessment LLE ROM/Strength/Tone: Deficits LLE ROM/Strength/Tone Deficits: pt reports L side weaker than R since cranial surgery Trunk Assessment Trunk Assessment: Kyphotic   Balance    End of Session PT - End of Session Equipment Utilized During Treatment: Gait belt Activity Tolerance: Patient tolerated treatment well Patient left: in bed;with call bell/phone within reach;with family/visitor present   Arthea Nobel,KATHrine E 05/23/2011, 3:12 PM Pager: [phone]

## 2011-05-23 NOTE — Progress Notes (Signed)
Gave pt discharge instructions and explained them to her. Pt verbalized understanding of instructions given. Pt left via wheelchair with the tech and was in no acute distress.

## 2011-05-26 ENCOUNTER — Encounter: Payer: Self-pay | Admitting: Physical Medicine & Rehabilitation

## 2011-05-26 ENCOUNTER — Encounter: Payer: Medicare Other | Attending: Physical Medicine & Rehabilitation | Admitting: Physical Medicine & Rehabilitation

## 2011-05-26 VITALS — BP 138/71 | HR 74 | Resp 14 | Ht 64.0 in | Wt 136.0 lb

## 2011-05-26 DIAGNOSIS — S065X9A Traumatic subdural hemorrhage with loss of consciousness of unspecified duration, initial encounter: Secondary | ICD-10-CM

## 2011-05-26 DIAGNOSIS — R413 Other amnesia: Secondary | ICD-10-CM | POA: Insufficient documentation

## 2011-05-26 DIAGNOSIS — R569 Unspecified convulsions: Secondary | ICD-10-CM

## 2011-05-26 DIAGNOSIS — I6200 Nontraumatic subdural hemorrhage, unspecified: Secondary | ICD-10-CM | POA: Insufficient documentation

## 2011-05-26 DIAGNOSIS — R5381 Other malaise: Secondary | ICD-10-CM | POA: Insufficient documentation

## 2011-05-26 DIAGNOSIS — I4891 Unspecified atrial fibrillation: Secondary | ICD-10-CM | POA: Insufficient documentation

## 2011-05-26 NOTE — Progress Notes (Signed)
Subjective:    Patient ID: [name], female    DOB: August 25, 1940, 71 y.o.   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  Unfortunately she developed seizures over the weekend. She went to the ER.  On Sunday she was seen in the [facility] ED.  She is back on keppra now and hasn't had any further seizures since then. CT done on 5/6 was stable. She was prescribed therapy at home as well "for six weeks".  She's using a cane for safety but feels that she pretty much is back at baseline from a functional standpoint.  She still feels numb under the left eye and has had problems with her vision since the seizure.   Pain Inventory Average Pain 5 Pain Right Now 6 My pain is tingling  In the last 24 hours, has pain interfered with the following? General activity 2 Relation with others 2 Enjoyment of life 2 What TIME of day is your pain at its worst? morning Sleep (in general) Fair  Pain is worse with: walking, bending and some activites Pain improves with: rest and therapy/exercise Relief from Meds: 5  Mobility use a cane how many minutes can you walk?  ability to climb steps?  no do you drive?  no  Function retired Do you have any goals in this area?  yes  Neuro/Psych weakness confusion  Prior Studies Any changes since last visit?  no  Physicians involved in your care Any changes since last visit?  no       Review of Systems  Constitutional: Positive for unexpected weight change.       High blood sugar  HENT: Negative.   Eyes: Negative.   Respiratory: Positive for cough.   Cardiovascular: Negative.   Gastrointestinal: Positive for nausea.  Genitourinary: Negative.   Musculoskeletal: Positive for joint swelling.  Skin: Negative.   Neurological: Positive for weakness.  Hematological: Negative.   Psychiatric/Behavioral: Negative.        Objective:   Physical Exam  Constitutional: She is oriented to person, place, and time. She appears well-developed and  well-nourished.  HENT:  Head: Normocephalic and atraumatic.  Eyes: Conjunctivae and EOM are normal. Pupils are equal, round, and reactive to light.  Cardiovascular: Normal rate.   Pulmonary/Chest: Effort normal. No respiratory distress. She has no wheezes.  Abdominal: Soft. She exhibits no distension.  Musculoskeletal: Normal range of motion.  Neurological: She is alert and oriented to person, place, and time.       Visual acuity is slightly diminished for items afar.. Otherwise CN intact. Does have slight decrease in LT over left upper cheek.  Motor and sensory exam in the left arm an leg are normal.  Balance is reasonable, although her gait is slightly more wide based and she tended to lean slightly to the right.   Psychiatric: She has a normal mood and affect. Her speech is normal and behavior is normal. Thought content normal. Cognition and memory are not impaired. She does not express impulsivity or inappropriate judgment. She exhibits normal recent memory and normal remote memory.          Assessment & Plan:  ASSESSMENT:  1. Right subdural hemorrhage requiring craniotomy.  2. 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  3. Atrial fibrillation.   PLAN:  1. Continue keppra lifelong. She really had tolerated the medicine well before.  2. No work for at least 2 weeks. 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 recommnend visual  evaluation.  i doubt these seizures have led to any new problems with her vision. 4. The patient was advised to call me with any further neurological changes or problems.  I think she should bounce back from this nicely, however 5. She may see me PRN.

## 2011-05-26 NOTE — Patient Instructions (Signed)
YOU MAY RESUME WORK IN 2 WEEKS IF YOU FEEL YOUR BALANCE AND STRENGTH ARE AT BASELINE.   MAKE AN APPOINTMENT TO SEE YOUR OPTOMETRIST TO HAVE YOUR VISION CHECKED.   PLEASE CALL ME IF YOU HAVE ANY FURTHER PROBLEMS WITH NUMBNESS, WEAKNESS, BALANCE, ETC.

## 2011-05-31 ENCOUNTER — Telehealth: Payer: Self-pay | Admitting: *Deleted

## 2011-05-31 NOTE — Telephone Encounter (Signed)
[name] from [facility] called saying they were supposed to be starting PT on [name], but the patient tells her that she saw you in the office and you told her she did not have to do physical therapy.  [name] wants a confirmation on this so that she can d/c.

## 2011-05-31 NOTE — Telephone Encounter (Signed)
Yes she is correct.  Don't see where it's indicated

## 2011-05-31 NOTE — Telephone Encounter (Signed)
Notified [name] of Dr Charm Barges reply.

## 2011-06-13 ENCOUNTER — Encounter: Payer: Self-pay | Admitting: Internal Medicine

## 2011-06-13 ENCOUNTER — Ambulatory Visit (INDEPENDENT_AMBULATORY_CARE_PROVIDER_SITE_OTHER): Payer: Medicare Other | Admitting: Internal Medicine

## 2011-06-13 VITALS — BP 104/68 | HR 80 | Ht 64.0 in | Wt 139.1 lb

## 2011-06-13 DIAGNOSIS — I5022 Chronic systolic (congestive) heart failure: Secondary | ICD-10-CM

## 2011-06-13 DIAGNOSIS — I6200 Nontraumatic subdural hemorrhage, unspecified: Secondary | ICD-10-CM

## 2011-06-13 DIAGNOSIS — I4891 Unspecified atrial fibrillation: Secondary | ICD-10-CM

## 2011-06-13 DIAGNOSIS — Z95810 Presence of automatic (implantable) cardiac defibrillator: Secondary | ICD-10-CM

## 2011-06-13 DIAGNOSIS — I10 Essential (primary) hypertension: Secondary | ICD-10-CM

## 2011-06-13 DIAGNOSIS — R569 Unspecified convulsions: Secondary | ICD-10-CM

## 2011-06-13 DIAGNOSIS — I480 Paroxysmal atrial fibrillation: Secondary | ICD-10-CM

## 2011-06-13 DIAGNOSIS — S065X9A Traumatic subdural hemorrhage with loss of consciousness of unspecified duration, initial encounter: Secondary | ICD-10-CM

## 2011-06-13 DIAGNOSIS — I428 Other cardiomyopathies: Secondary | ICD-10-CM

## 2011-06-13 LAB — ICD DEVICE OBSERVATION
AL AMPLITUDE: 1.9 mv
AL THRESHOLD: 0.625 V
DEV-0020ICD: NEGATIVE
DEVICE MODEL ICD: 7025184
LV LEAD THRESHOLD: 1 V
RV LEAD IMPEDENCE ICD: 300 Ohm
RV LEAD THRESHOLD: 0.625 V
TZAT-0001SLOWVT: 1
TZAT-0013SLOWVT: 3
TZAT-0020SLOWVT: 1 ms
TZON-0010SLOWVT: 40 ms
TZST-0001SLOWVT: 2
TZST-0001SLOWVT: 4
TZST-0001SLOWVT: 5
TZST-0003SLOWVT: 36 J
TZST-0003SLOWVT: 40 J

## 2011-06-13 NOTE — Progress Notes (Signed)
HPI  [name] is a 71 y.o. female  seen in followup for CRT-D implanted at [facility] [place] for   nonischemic cardiomyopathy.  There has been intercurrent but traniseint normalization of left ventricular systolic function by an echo summer 2012  demonstrating an ejection fraction of 55%-60%.   Repeat echo12/12 EF 35%  We do not have these records as the patient was hospitalized at [facility] in [facility] with subdural hematoma requiring craniotomy complicated by seizures   she has had a recent subsequent seizure and she is not driving for 6 months.  The patient denies  chest pain,or palpitations.  She has had some shortness of breath. She notes that when she climbs a flight of stairs. 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 There has been mild edema. Her diuretics were discontinued by the people at rehabilitation  She underwent CRT device generator replacement in Feb 2013 with insertion of new ICD lead 2/2 externalization of the previous RIATA lead  She is currently not on anticoagulation 2/2   Past Medical History  Diagnosis Date  . NICM (nonischemic cardiomyopathy)     itial EF of 35%. Normal in 07/2009; 35% 12/12  . COPD (chronic obstructive pulmonary disease)   . DM2 (diabetes mellitus, type 2)   . HLD (hyperlipidemia)   . Vitamin d deficiency   . Chronic systolic heart failure        . PAF (paroxysmal atrial fibrillation)   . Hypertension   . Subdural hematoma     12/12    . 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  s/p revision 2/2 ERI 02/28/2011 - [facility] CD 3257  . Seizures     Past Surgical History  Procedure Date  . Cardiac defibrillator placement 2006  . Total abdominal hysterectomy 1983  . Cardiac catheterization     no significant CAD  . Brain hematoma evacutation     Current Outpatient Prescriptions  Medication Sig Dispense Refill  . albuterol (PROVENTIL  HFA;VENTOLIN HFA) 108 (90 BASE) MCG/ACT inhaler Inhale 2 puffs into the lungs every 6 (six) hours as needed. For wheezing      . carvedilol (COREG) 25 MG tablet Take 25 mg by mouth 2 (two) times daily with a meal.        . ergocalciferol (VITAMIN D2) 50000 UNITS capsule Take 50,000 Units by mouth 2 (two) times a week. On Monday and Friday      . fenofibrate (TRICOR) 145 MG tablet Take 145 mg by mouth daily. Take 1/2 a tablet daily.      . ferrous sulfate 325 (65 FE) MG tablet Take 325 mg by mouth 2 (two) times daily with a meal.      . furosemide (LASIX) 20 MG tablet Take 20 mg by mouth daily. Take every other day (with potassium)      . levETIRAcetam (KEPPRA) 750 MG tablet Take 750 mg by mouth every 12 (twelve) hours.      [name] olmesartan (BENICAR) 40 MG tablet Take 40 mg by mouth daily.      . pantoprazole (PROTONIX) 40 MG tablet Take 40 mg by mouth 2 (two) times daily before a meal.      . potassium chloride (K-DUR,KLOR-CON) 10 MEQ tablet Take 10 mEq by mouth daily. Take every other day with lasix (furosemide)      . simvastatin (ZOCOR) 40 MG tablet Take 20 mg  by mouth at bedtime.       . sitaGLIPtan (JANUVIA) 100 MG tablet Take 100 mg by mouth daily.          Allergies  Allergen Reactions  . Codeine Other (See Comments)    "puts me on a trip"  . Lactose Intolerance (Gi) Diarrhea  . Morphine And Related   . Valium Hives and Rash    Review of Systems negative except from HPI and PMH  Physical Exam BP 104/68  Pulse 80  Ht 5\' 4"  (1.626 m)  Wt 139 lb 1.9 oz (63.104 kg)  BMI 23.88 kg/m2 Well developed and well nourished in no acute distress HENT normal E scleral and icterus clear Neck Supple JVP flat; carotids brisk and full Clear to ausculation Regular rate and rhythm, no murmurs gallops or rub Soft with active bowel sounds No clubbing cyanosis none Edema Alert and oriented, grossly normal motor and sensory function Skin Warm and Dry    Assessment and  Plan

## 2011-06-13 NOTE — Assessment & Plan Note (Signed)
Patient has no documented recurrent paroxysmal atrial fibrillation. I does some review of this issue about craniotomy and subdural hematoma and I think the bottom line is that patients with a CHADSof greater than 2 at the risk of recurrent falls typically is not sufficient to justify the discontinuation of anticoagulation. This is probably even more so based on the  AVERROES data. However, given the seizures I think we will defer that for now.

## 2011-06-13 NOTE — Assessment & Plan Note (Addendum)
Currently well controlled.  

## 2011-06-13 NOTE — Assessment & Plan Note (Signed)
The patient's device was interrogated.  The information was reviewed. No changes were made in the programming.    

## 2011-06-13 NOTE — Assessment & Plan Note (Signed)
We will continue her current medications. At her next visit, if her symptoms persist, we'll add Aldactone

## 2011-06-13 NOTE — Patient Instructions (Signed)
Your physician wants you to follow-up in: 6 months with Dr. Caryl Comes. You will receive a reminder letter in the mail two months in advance. If you don't receive a letter, please call our office to schedule the follow-up appointment.  Remote monitoring is used to monitor your Pacemaker of ICD from home. This monitoring reduces the number of office visits required to check your device to one time per year. It allows [place] to keep an eye on the functioning of your device to ensure it is working properly. You are scheduled for a device check from home on 09/21/11. You may send your transmission at any time that day. If you have a wireless device, the transmission will be sent automatically. After your physician reviews your transmission, you will receive a postcard with your next transmission date.  Your physician recommends that you continue on your current medications as directed. Please refer to the Current Medication list given to you today.

## 2011-06-13 NOTE — Assessment & Plan Note (Signed)
As above.

## 2011-09-21 ENCOUNTER — Encounter: Payer: Self-pay | Admitting: Internal Medicine

## 2011-09-21 ENCOUNTER — Ambulatory Visit (INDEPENDENT_AMBULATORY_CARE_PROVIDER_SITE_OTHER): Payer: Medicare Other | Admitting: *Deleted

## 2011-09-21 DIAGNOSIS — I5022 Chronic systolic (congestive) heart failure: Secondary | ICD-10-CM

## 2011-09-21 DIAGNOSIS — I428 Other cardiomyopathies: Secondary | ICD-10-CM

## 2011-09-21 DIAGNOSIS — Z95810 Presence of automatic (implantable) cardiac defibrillator: Secondary | ICD-10-CM

## 2011-09-22 ENCOUNTER — Encounter: Payer: Self-pay | Admitting: *Deleted

## 2011-09-22 LAB — REMOTE ICD DEVICE
AL AMPLITUDE: 2.4 mv
AL IMPEDENCE ICD: 350 Ohm
ATRIAL PACING ICD: 57 pct
BAMS-0001: 180 {beats}/min
DEV-0020ICD: NEGATIVE
HV IMPEDENCE: 40 Ohm
MODE SWITCH EPISODES: 1
RV LEAD AMPLITUDE: 901 mv
RV LEAD THRESHOLD: 0.75 V
TZAT-0001SLOWVT: 1
TZAT-0004SLOWVT: 8
TZAT-0018SLOWVT: NEGATIVE
TZAT-0019SLOWVT: 7.5 V
TZON-0003SLOWVT: 355 ms
TZON-0004SLOWVT: 40
TZON-0010SLOWVT: 40 ms
TZST-0001SLOWVT: 2
TZST-0001SLOWVT: 4
TZST-0003SLOWVT: 36 J
TZST-0003SLOWVT: 40 J

## 2011-09-27 ENCOUNTER — Encounter: Payer: Self-pay | Admitting: *Deleted

## 2011-12-12 ENCOUNTER — Encounter: Payer: Self-pay | Admitting: Internal Medicine

## 2011-12-12 ENCOUNTER — Ambulatory Visit (INDEPENDENT_AMBULATORY_CARE_PROVIDER_SITE_OTHER): Payer: Medicare Other | Admitting: Internal Medicine

## 2011-12-12 VITALS — BP 125/69 | HR 69 | Ht 64.0 in | Wt 149.0 lb

## 2011-12-12 DIAGNOSIS — I428 Other cardiomyopathies: Secondary | ICD-10-CM

## 2011-12-12 DIAGNOSIS — R0602 Shortness of breath: Secondary | ICD-10-CM

## 2011-12-12 DIAGNOSIS — Z95810 Presence of automatic (implantable) cardiac defibrillator: Secondary | ICD-10-CM

## 2011-12-12 DIAGNOSIS — I5022 Chronic systolic (congestive) heart failure: Secondary | ICD-10-CM

## 2011-12-12 LAB — ICD DEVICE OBSERVATION
AL AMPLITUDE: 2.2 mv
ATRIAL PACING ICD: 62 pct
BAMS-0003: 70 {beats}/min
DEV-0020ICD: NEGATIVE
DEVICE MODEL ICD: 7025184
FVT: 0
MODE SWITCH EPISODES: 3
PACEART VT: 0
RV LEAD IMPEDENCE ICD: 350 Ohm
RV LEAD THRESHOLD: 0.75 V
TZAT-0012SLOWVT: 200 ms
TZAT-0013SLOWVT: 3
TZAT-0018SLOWVT: NEGATIVE
TZAT-0020SLOWVT: 1 ms
TZON-0003SLOWVT: 355 ms
TZON-0005SLOWVT: 6
TZST-0001SLOWVT: 5
TZST-0003SLOWVT: 40 J

## 2011-12-12 NOTE — Patient Instructions (Addendum)
Your physician has requested that you have an echocardiogram with resynchronization. Echocardiography is a painless test that uses sound waves to create images of your heart. It provides your doctor with information about the size and shape of your heart and how well your heart's chambers and valves are working. This procedure takes approximately one hour. There are no restrictions for this procedure.  Your physician has requested that you have a lexiscan myoview. For further information please visit [email or web address]. Please follow instruction sheet, as given.

## 2011-12-12 NOTE — Assessment & Plan Note (Signed)
No recurrent atrial fibrillation. This buys [place] more time as relates to anticoagulation

## 2011-12-12 NOTE — Assessment & Plan Note (Signed)
She has progressive dyspnea with some evidence of volume overload. We'll undertake an echo with AV optimization of  systolic function and to see if we can improve the hemodynamics of her device

## 2011-12-12 NOTE — Assessment & Plan Note (Signed)
The patient's device was interrogated.  The information was reviewed. No changes were made in the programming.    

## 2011-12-12 NOTE — Assessment & Plan Note (Signed)
This may be contributing; I've asked her to follow up with her PCP who is also a pulmonary doctor

## 2011-12-12 NOTE — Progress Notes (Signed)
Patient Care Team: Imran Gertie Baron as PCP - General (Internal Medicine)   HPI  [name] is a 71 y.o. female seen in followup for CRT-D implanted at [facility] for  nonischemic cardiomyopathy. There has been intercurrent but transient normalization of left ventricular systolic function by an echo summer 2012 demonstrating an ejection fraction of 55%-60%. Repeat echo12/12 EF 35% We do not have these records as the patient was hospitalized at [facility] in   with subdural hematoma requiring craniotomy complicated by seizures.   .  The patient denies chest pain,or palpitations. She has had some shortness of breath. She notes that when she climbs a flight of stairs. 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 There has been mild edema. Her diuretics were discontinued by the people at rehabilitation  She underwent CRT device generator replacement in Feb 2013 with insertion of new ICD lead 2/2 externalization of the previous RIATA lead  She is currently not on anticoagulation 2/2 subdural hematoma. There has been no intercurrent atrial fibrillation.  Her major complaint is of progressive shortness of breath. This is accompanied by some PND as well as peripheral edema. She has some vague nondescript chest discomfort. Catheterization done presumably before her ICD implanted at [facility] demonstrated some years ago no obstructive coronary disease   Past Medical History  Diagnosis Date  . NICM (nonischemic cardiomyopathy)     itial EF of 35%. Normal in 07/2009; 35% 12/12  . COPD (chronic obstructive pulmonary disease)   . DM2 (diabetes mellitus, type 2)   . HLD (hyperlipidemia)   . Vitamin D deficiency   . Chronic systolic heart failure        . PAF (paroxysmal atrial fibrillation)   . Hypertension   . Subdural hematoma     12/12    . 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  s/p revision  2/2 ERI 02/28/2011 - [facility] CD 3257  . Seizures     Past Surgical History  Procedure Date  . Cardiac defibrillator placement 2006  . Total abdominal hysterectomy 1983  . Cardiac catheterization     no significant CAD  . Brain hematoma evacutation     Current Outpatient Prescriptions  Medication Sig Dispense Refill  . albuterol (PROVENTIL HFA;VENTOLIN HFA) 108 (90 BASE) MCG/ACT inhaler Inhale 2 puffs into the lungs every 6 (six) hours as needed. For wheezing      . carvedilol (COREG) 25 MG tablet Take 25 mg by mouth 2 (two) times daily with a meal.        . ergocalciferol (VITAMIN D2) 50000 UNITS capsule Take 50,000 Units by mouth 2 (two) times a week. On Monday and Friday      . fenofibrate (TRICOR) 145 MG tablet Take 145 mg by mouth daily. Take 1/2 a tablet daily.      . furosemide (LASIX) 20 MG tablet Take 20 mg by mouth. Every other day      . levETIRAcetam (KEPPRA) 750 MG tablet Take 750 mg by mouth every 12 (twelve) hours.      [name] olmesartan (BENICAR) 40 MG tablet Take 40 mg by mouth daily.      . pantoprazole (PROTONIX) 40 MG tablet Take 40 mg by mouth 2 (two) times daily before a meal.      . potassium chloride SA (K-DUR,KLOR-CON) 20 MEQ tablet Take 20 mEq by mouth.      [name]  simvastatin (ZOCOR) 40 MG tablet Take 20 mg by mouth at bedtime.       . sitaGLIPtan (JANUVIA) 100 MG tablet Take 100 mg by mouth daily.          Allergies  Allergen Reactions  . Codeine Other (See Comments)    "puts me on a trip"  . Lactose Intolerance (Gi) Diarrhea  . Morphine And Related   . Valium Hives and Rash    Review of Systems negative except from HPI and PMH  Physical Exam BP 125/69  Pulse 69  Ht 5\' 4"  (1.626 m)  Wt 149 lb (67.586 kg)  BMI 25.58 kg/m2 Well developed and well nourished in no acute distress HENT normal E scleral and icterus clear Neck Supple JVP8-9 call carotids brisk and full Clear to ausculation  regular rate and rhythm, 2/6 murmur along the left sternal border Soft  with active bowel sounds No clubbing cyanosis tr Edema Alert and oriented, grossly normal motor and sensory function Skin Warm and Dry    Assessment and  Plan

## 2011-12-12 NOTE — Assessment & Plan Note (Signed)
There is an interval worsening of LV function. We will reassess it. We'll also undertake Myoview to see if they understand why it is that her functional capacity has changed so months over recent months

## 2011-12-22 ENCOUNTER — Ambulatory Visit (HOSPITAL_COMMUNITY): Payer: Medicare Other | Attending: Internal Medicine | Admitting: Radiology

## 2011-12-22 DIAGNOSIS — R0602 Shortness of breath: Secondary | ICD-10-CM

## 2011-12-22 DIAGNOSIS — I4891 Unspecified atrial fibrillation: Secondary | ICD-10-CM | POA: Insufficient documentation

## 2011-12-22 DIAGNOSIS — E119 Type 2 diabetes mellitus without complications: Secondary | ICD-10-CM | POA: Insufficient documentation

## 2011-12-22 DIAGNOSIS — I10 Essential (primary) hypertension: Secondary | ICD-10-CM | POA: Insufficient documentation

## 2011-12-22 DIAGNOSIS — J4489 Other specified chronic obstructive pulmonary disease: Secondary | ICD-10-CM | POA: Insufficient documentation

## 2011-12-22 DIAGNOSIS — J449 Chronic obstructive pulmonary disease, unspecified: Secondary | ICD-10-CM | POA: Insufficient documentation

## 2011-12-22 DIAGNOSIS — E785 Hyperlipidemia, unspecified: Secondary | ICD-10-CM | POA: Insufficient documentation

## 2011-12-22 DIAGNOSIS — I079 Rheumatic tricuspid valve disease, unspecified: Secondary | ICD-10-CM | POA: Insufficient documentation

## 2011-12-22 DIAGNOSIS — I509 Heart failure, unspecified: Secondary | ICD-10-CM | POA: Insufficient documentation

## 2011-12-22 DIAGNOSIS — I428 Other cardiomyopathies: Secondary | ICD-10-CM

## 2011-12-22 DIAGNOSIS — R011 Cardiac murmur, unspecified: Secondary | ICD-10-CM | POA: Insufficient documentation

## 2011-12-22 DIAGNOSIS — I517 Cardiomegaly: Secondary | ICD-10-CM | POA: Insufficient documentation

## 2011-12-22 NOTE — Progress Notes (Signed)
Echocardiogram performed.  

## 2011-12-25 ENCOUNTER — Ambulatory Visit (HOSPITAL_COMMUNITY): Payer: Medicare Other | Attending: Internal Medicine | Admitting: Radiology

## 2011-12-25 VITALS — BP 132/72 | Ht 64.0 in | Wt 145.0 lb

## 2011-12-25 DIAGNOSIS — I509 Heart failure, unspecified: Secondary | ICD-10-CM | POA: Insufficient documentation

## 2011-12-25 DIAGNOSIS — Z87891 Personal history of nicotine dependence: Secondary | ICD-10-CM | POA: Insufficient documentation

## 2011-12-25 DIAGNOSIS — J449 Chronic obstructive pulmonary disease, unspecified: Secondary | ICD-10-CM | POA: Insufficient documentation

## 2011-12-25 DIAGNOSIS — I10 Essential (primary) hypertension: Secondary | ICD-10-CM | POA: Insufficient documentation

## 2011-12-25 DIAGNOSIS — R079 Chest pain, unspecified: Secondary | ICD-10-CM

## 2011-12-25 DIAGNOSIS — J4489 Other specified chronic obstructive pulmonary disease: Secondary | ICD-10-CM | POA: Insufficient documentation

## 2011-12-25 DIAGNOSIS — R0789 Other chest pain: Secondary | ICD-10-CM | POA: Insufficient documentation

## 2011-12-25 DIAGNOSIS — Z95810 Presence of automatic (implantable) cardiac defibrillator: Secondary | ICD-10-CM | POA: Insufficient documentation

## 2011-12-25 DIAGNOSIS — R0602 Shortness of breath: Secondary | ICD-10-CM

## 2011-12-25 DIAGNOSIS — E119 Type 2 diabetes mellitus without complications: Secondary | ICD-10-CM | POA: Insufficient documentation

## 2011-12-25 DIAGNOSIS — R002 Palpitations: Secondary | ICD-10-CM | POA: Insufficient documentation

## 2011-12-25 DIAGNOSIS — I4891 Unspecified atrial fibrillation: Secondary | ICD-10-CM | POA: Insufficient documentation

## 2011-12-25 DIAGNOSIS — R0989 Other specified symptoms and signs involving the circulatory and respiratory systems: Secondary | ICD-10-CM | POA: Insufficient documentation

## 2011-12-25 DIAGNOSIS — R0609 Other forms of dyspnea: Secondary | ICD-10-CM | POA: Insufficient documentation

## 2011-12-25 DIAGNOSIS — E785 Hyperlipidemia, unspecified: Secondary | ICD-10-CM | POA: Insufficient documentation

## 2011-12-25 MED ORDER — REGADENOSON 0.4 MG/5ML IV SOLN
0.4000 mg | Freq: Once | INTRAVENOUS | Status: AC
Start: 2011-12-25 — End: 2011-12-25
  Administered 2011-12-25: 0.4 mg via INTRAVENOUS

## 2011-12-25 MED ORDER — TECHNETIUM TC 99M SESTAMIBI GENERIC - CARDIOLITE
30.0000 | Freq: Once | INTRAVENOUS | Status: AC | PRN
  Administered 2011-12-25: 30 via INTRAVENOUS

## 2011-12-25 MED ORDER — TECHNETIUM TC 99M SESTAMIBI GENERIC - CARDIOLITE
10.0000 | Freq: Once | INTRAVENOUS | Status: AC | PRN
  Administered 2011-12-25: 10 via INTRAVENOUS

## 2011-12-25 MED ORDER — AMINOPHYLLINE 25 MG/ML IV SOLN
75.0000 mg | Freq: Once | INTRAVENOUS | Status: AC
  Administered 2011-12-25: 75 mg via INTRAVENOUS

## 2011-12-25 NOTE — Progress Notes (Signed)
[facility] [id number] [place] [address] [phone]  Cardiology Nuclear Med Study  [name] is a 71 y.o. female     MRN : [id number]     DOB: [birth year]  Procedure Date: 12/25/2011  Nuclear Med Background Indication for Stress Test:  Evaluation for Ischemia History:  COPD, Emphysema and AFIB, NICM, CHF, 2006 Defibrilator 12/12 ECHO: EF: 35% at [facility], Heart Cath: N/O CAD, 02/2011 Defibrillator revision  Cardiac Risk Factors: History of Smoking, Hypertension, Lipids and NIDDM  Symptoms:  Chest Tightness, DOE, Palpitations and SOB   Nuclear Pre-Procedure Caffeine/Decaff Intake:  None > 12 hrs NPO After: 5:30pm   Lungs:  clear O2 Sat: 96% on room air. IV 0.9% NS with Angio Cath:  24g  IV Site: R Wrist x 1, tolerated well IV Started by:  Irven Baltimore, RN  Chest Size (in):  34 Cup Size: A  Height: 5\' 4"  (1.626 m)  Weight:  145 lb (65.772 kg)  BMI:  Body mass index is 24.89 kg/(m^2). Tech Comments:  Held Coreg and Januvia this am    Nuclear Med Study 1 or 2 day study: 1 day  Stress Test Type:  Lexiscan  Reading MD: Dorris Carnes, MD  Order Authorizing Provider:  Virl Axe, MD  Resting Radionuclide: Technetium 71m Sestamibi  Resting Radionuclide Dose: 11.0 mCi   Stress Radionuclide:  Technetium 7m Sestamibi  Stress Radionuclide Dose: 33.0 mCi           Stress Protocol Rest HR: 71 Stress HR: 85  Rest BP: 132/72 Stress BP: 131/83  Exercise Time (min): n/a METS: n/a   Predicted Max HR: 148 bpm % Max HR: 57.43 bpm Rate Pressure Product: 11220   Dose of Adenosine (mg):  n/a Dose of Lexiscan: 0.4 mg  Dose of Atropine (mg): n/a Dose of Dobutamine: n/a mcg/kg/min (at max HR)  Stress Test Technologist: Perrin Maltese, EMT-P  Nuclear Technologist:  Charlton Amor, CNMT     Rest Procedure:  Myocardial perfusion imaging was performed at rest 45 minutes following the intravenous administration of Technetium 39m  Sestamibi. Rest ECG: AV sequential pacemaker.  Stress Procedure:  The patient received IV Lexiscan 0.4 mg over 15-seconds.  Technetium 64m Tetrofosmin injected at 30-seconds.  There were no significant changes, + sob, abdominal pain, nausea, headache, and chest tightness with Lexiscan.  This patient was given Aminophylline 75 mg IV for symptoms, with all of them resolved after that. Quantitative spect images were obtained after a 45 minute delay. Stress ECG: Nondiagnostic due to pacing.  QPS Raw Data Images:  Soft tissue (diaphragm, bowel, arm) surround heart. Stress Images:  Bowel activity underlies heart.  NOrmal perfusion. Rest Images:  Normal homogeneous uptake in all areas of the myocardium. Subtraction (SDS):  No evidence of ischemia. Transient Ischemic Dilatation (Normal <1.22):  1.06 Lung/Heart Ratio (Normal <0.45):  0.35  Quantitative Gated Spect Images QGS EDV:  n/a ml QGS ESV:  n/a ml  Impression Exercise Capacity:  Lexiscan with no exercise. BP Response:  Normal blood pressure response. Clinical Symptoms:  Mild chest pain/dyspnea. ECG Impression:  Nondiagnostic due to pacing. Comparison with Prior Nuclear Study: No previous test.  Overall Impression:  Normal perfusion.  LV Ejection Fraction: Study not gated.  LV Wall Motion:  Study not gated  [employer]

## 2011-12-29 ENCOUNTER — Other Ambulatory Visit (HOSPITAL_COMMUNITY): Payer: Medicare Other

## 2012-03-14 ENCOUNTER — Ambulatory Visit (INDEPENDENT_AMBULATORY_CARE_PROVIDER_SITE_OTHER): Payer: Medicare Other | Admitting: *Deleted

## 2012-03-14 ENCOUNTER — Encounter: Payer: Self-pay | Admitting: Internal Medicine

## 2012-03-14 ENCOUNTER — Other Ambulatory Visit: Payer: Self-pay

## 2012-03-14 DIAGNOSIS — I428 Other cardiomyopathies: Secondary | ICD-10-CM

## 2012-03-14 DIAGNOSIS — I5022 Chronic systolic (congestive) heart failure: Secondary | ICD-10-CM

## 2012-03-14 LAB — ICD DEVICE OBSERVATION
ATRIAL PACING ICD: 67 pct
BAMS-0001: 180 {beats}/min
BAMS-0003: 70 {beats}/min
FVT: 0
HV IMPEDENCE: 42 Ohm
LV LEAD IMPEDENCE ICD: 787.5 Ohm
RV LEAD AMPLITUDE: 9.7 mv
RV LEAD IMPEDENCE ICD: 350 Ohm
TOT-0006: 20130213000000
TOT-0008: 0
TOT-0009: 0
TOT-0010: 5
TZAT-0004SLOWVT: 8
TZAT-0012SLOWVT: 200 ms
TZAT-0013SLOWVT: 3
TZAT-0018SLOWVT: NEGATIVE
TZAT-0019SLOWVT: 7.5 V
TZON-0003SLOWVT: 355 ms
TZON-0004SLOWVT: 40
TZST-0001SLOWVT: 3
TZST-0003SLOWVT: 40 J

## 2012-03-14 NOTE — Progress Notes (Signed)
ICD check

## 2012-03-19 ENCOUNTER — Other Ambulatory Visit: Payer: Self-pay | Admitting: Internal Medicine

## 2012-05-19 IMAGING — CR DG CHEST 1V PORT
1 series · 1 of 1 positions shown · non-contrast
Comparison: 12/23/2010.

CLINICAL DATA: Pacemaker insertion.  Evaluate for pneumothorax.

PORTABLE CHEST - 1 VIEW

[view not recorded]
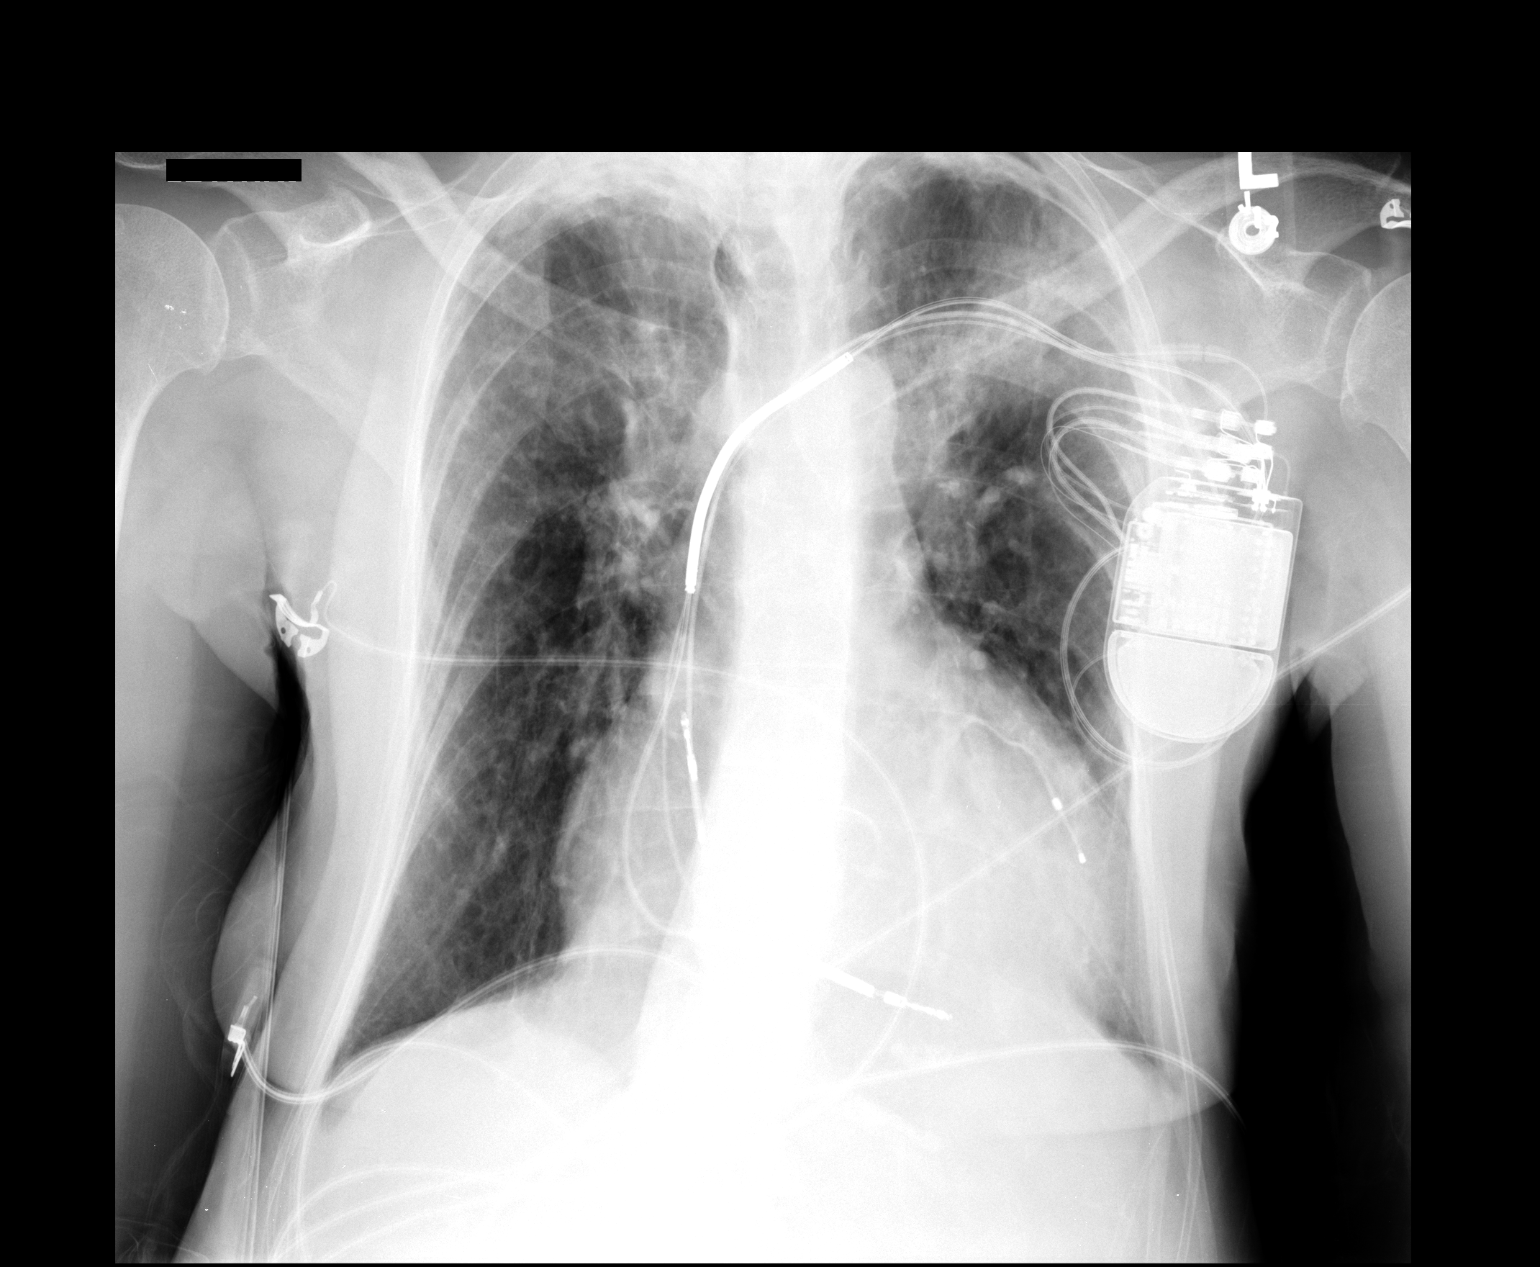

[1 of 1 positions shown; findings below may reference images not displayed]

FINDINGS: Trachea is midline.  Heart is enlarged.  Pacemaker and
AICD lead tips are stable from 12/23/2010.  Biapical pleural
parenchymal scarring.  There is a sliver of lucency at the apex of
the left hemithorax, which appears new from prior exams. Rounded
opacification projecting over the left upper lobe appears new.
Lungs are otherwise grossly clear.
IMPRESSION: 1.  Suspect tiny left apical pneumothorax.  Please see discussion
above.
2.  New opacity and surrounding lucency projecting over the upper
left hemithorax which may be within the overlying soft tissues, as
can be seen with a hematoma.  Left upper lobe air space disease
cannot be excluded.

## 2012-06-11 ENCOUNTER — Ambulatory Visit (INDEPENDENT_AMBULATORY_CARE_PROVIDER_SITE_OTHER): Payer: Medicare Other | Admitting: Internal Medicine

## 2012-06-11 ENCOUNTER — Other Ambulatory Visit: Payer: Self-pay

## 2012-06-11 ENCOUNTER — Encounter: Payer: Self-pay | Admitting: Internal Medicine

## 2012-06-11 VITALS — BP 98/70 | HR 70 | Ht 64.5 in | Wt 142.0 lb

## 2012-06-11 DIAGNOSIS — I480 Paroxysmal atrial fibrillation: Secondary | ICD-10-CM

## 2012-06-11 DIAGNOSIS — I428 Other cardiomyopathies: Secondary | ICD-10-CM

## 2012-06-11 DIAGNOSIS — R0683 Snoring: Secondary | ICD-10-CM

## 2012-06-11 DIAGNOSIS — I4891 Unspecified atrial fibrillation: Secondary | ICD-10-CM

## 2012-06-11 DIAGNOSIS — R0609 Other forms of dyspnea: Secondary | ICD-10-CM

## 2012-06-11 DIAGNOSIS — R5383 Other fatigue: Secondary | ICD-10-CM

## 2012-06-11 DIAGNOSIS — R5381 Other malaise: Secondary | ICD-10-CM

## 2012-06-11 DIAGNOSIS — Z95810 Presence of automatic (implantable) cardiac defibrillator: Secondary | ICD-10-CM

## 2012-06-11 DIAGNOSIS — I5022 Chronic systolic (congestive) heart failure: Secondary | ICD-10-CM

## 2012-06-11 HISTORY — DX: Other fatigue: R53.83

## 2012-06-11 LAB — ICD DEVICE OBSERVATION
AL IMPEDENCE ICD: 350 Ohm
ATRIAL PACING ICD: 71 pct
BAMS-0001: 180 {beats}/min
DEVICE MODEL ICD: 7025184
FVT: 0
LV LEAD IMPEDENCE ICD: 912.5 Ohm
LV LEAD THRESHOLD: 1.25 V
RV LEAD AMPLITUDE: 9.7 mv
RV LEAD THRESHOLD: 0.5 V
TOT-0008: 0
TZAT-0001SLOWVT: 1
TZAT-0004SLOWVT: 8
TZAT-0018SLOWVT: NEGATIVE
TZON-0003SLOWVT: 355 ms
TZON-0010SLOWVT: 40 ms
TZST-0001SLOWVT: 3
TZST-0001SLOWVT: 5
TZST-0003SLOWVT: 36 J
TZST-0003SLOWVT: 40 J
VENTRICULAR PACING ICD: 99.88 pct

## 2012-06-11 NOTE — Assessment & Plan Note (Signed)
Euvolemic. She is on single drug therapy but blood pressure precludes alternative agents.  Her device was reprogrammed as above

## 2012-06-11 NOTE — Assessment & Plan Note (Signed)
The patient's device was interrogated and the information was fully reviewed.  The device was reprogrammed to  Make LV pacing early by 25 ms given the electrocardiogram showing a broad left bundle branch block pattern. It is important to remember she has a riata lead with externalization

## 2012-06-11 NOTE — Progress Notes (Deleted)
  Patient Name: [name]      SUBJECTIVE:***  Past Medical History  Diagnosis Date  . NICM (nonischemic cardiomyopathy)     itial EF of 35%. Normal in 07/2009; 35% 12/12  . COPD (chronic obstructive pulmonary disease)   . DM2 (diabetes mellitus, type 2)   . HLD (hyperlipidemia)   . Vitamin D deficiency   . Chronic systolic heart failure        . PAF (paroxysmal atrial fibrillation)   . Hypertension   . Subdural hematoma     12/12    . 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  s/p revision 2/2 ERI 02/28/2011 - [facility] CD 3257  . Seizures     PHYSICAL EXAM There were no vitals filed for this visit.  {Physical XB:[id number]  TELEMETRY: Reviewed telemetry pt in ***:   @IOBRIEF @  LABS: Basic Metabolic Panel: No results found for this basename: NA, K, CL, CO2, GLUCOSE, BUN, CREATININE, CALCIUM, MG, PHOS,  in the last 168 hours Cardiac Enzymes: No results found for this basename: CKTOTAL, CKMB, CKMBINDEX, TROPONINI,  in the last 72 hours CBC: No results found for this basename: WBC, NEUTROABS, HGB, HCT, MCV, PLT,  in the last 168 hours PROTIME: No results found for this basename: LABPROT, INR,  in the last 72 hours Liver Function Tests: No results found for this basename: AST, ALT, ALKPHOS, BILITOT, PROT, ALBUMIN,  in the last 72 hours No results found for this basename: LIPASE, AMYLASE,  in the last 72 hours BNP: BNP (last 3 results) No results found for this basename: PROBNP,  in the last 8760 hours D-Dimer: No results found for this basename: DDIMER,  in the last 72 hours Hemoglobin A1C: No results found for this basename: HGBA1C,  in the last 72 hours Fasting Lipid Panel: No results found for this basename: CHOL, HDL, LDLCALC, TRIG, CHOLHDL, LDLDIRECT,  in the last 72 hours Thyroid Function Tests: No results found for this basename: TSH, T4TOTAL, FREET3, T3FREE, THYROIDAB,  in the last 72 hours Anemia Panel: No results found for this  basename: VITAMINB12, FOLATE, FERRITIN, TIBC, IRON, RETICCTPCT,  in the last 72 hours   Device Interrogation:***   ASSESSMENT AND PLAN:  Active Problems:   * No active hospital problems. *    Signed, Virl Axe MD  06/11/2012

## 2012-06-11 NOTE — Patient Instructions (Signed)
Your physician has recommended that you have a sleep study. This test records several body functions during sleep, including: brain activity, eye movement, oxygen and carbon dioxide blood levels, heart rate and rhythm, breathing rate and rhythm, the flow of air through your mouth and nose, snoring, body muscle movements, and chest and belly movement.  Your physician recommends that you schedule a follow-up appointment in: 3 months with the device clinic  Your physician wants you to follow-up in: 6 months with Dr. Caryl Comes. You will receive a reminder letter in the mail two months in advance. If you don't receive a letter, please call our office to schedule the follow-up appointment.  Your physician recommends that you continue on your current medications as directed. Please refer to the Current Medication list given to you today.

## 2012-06-11 NOTE — Assessment & Plan Note (Signed)
Infrequent atrial fibrillation

## 2012-06-11 NOTE — Assessment & Plan Note (Signed)
SHe has fatigue with sleep disorder breathing and daytime somnolence. We will undertake a sleep study.

## 2012-06-11 NOTE — Assessment & Plan Note (Signed)
As above.

## 2012-06-11 NOTE — Progress Notes (Signed)
Patient Care Team: Imran Gertie Baron as PCP - General (Internal Medicine)   HPI  [name] is a 72 y.o. female seen in followup for CRT-D implanted at [facility] for  nonischemic cardiomyopathy.She underwent CRT device generator replacement in Feb 2013 with insertion of new ICD lead 2/2 externalization of the previous RIATA lead    There has been intercurrent but transient normalization of left ventricular systolic function by an echo summer 2012 demonstrating an ejection fraction of 55%-60%. Repeat echo12/12 EF 35% We do not have these records as the patient was hospitalized at [facility] with subdural hematoma requiring craniotomy complicated by seizures.   AV optimization echo 12/13 done. Ef 30 % with global hypokinesis   Myoview>>Normal perfusion .  The patient denies chest pain,or palpitations. She has had some shortness of breath. She notes that when she climbs a flight of stairs. 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 There has been mild edema. Her diuretics were discontinued by the people at rehabilitation   She also has significant fatigue daytime somnolence and poor resting  Remote sleep study was neg  Catheterization done presumably before her ICD implanted at [facility] demonstrated some years ago no obstructive coronary disease   Past Medical History  Diagnosis Date  . NICM (nonischemic cardiomyopathy)     itial EF of 35%. Normal in 07/2009; 35% 12/12  . COPD (chronic obstructive pulmonary disease)   . DM2 (diabetes mellitus, type 2)   . HLD (hyperlipidemia)   . Vitamin D deficiency   . Chronic systolic heart failure        . PAF (paroxysmal atrial fibrillation)   . Hypertension   . Subdural hematoma     12/12    . 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  s/p revision 2/2 ERI 02/28/2011 - [facility] CD 3257  . Seizures     Past Surgical History  Procedure  Laterality Date  . Cardiac defibrillator placement  2006  . Total abdominal hysterectomy  1983  . Cardiac catheterization      no significant CAD  . Brain hematoma evacutation      Current Outpatient Prescriptions  Medication Sig Dispense Refill  . albuterol (PROVENTIL HFA;VENTOLIN HFA) 108 (90 BASE) MCG/ACT inhaler Inhale 2 puffs into the lungs every 6 (six) hours as needed. For wheezing      . carvedilol (COREG) 25 MG tablet Take 25 mg by mouth 2 (two) times daily with a meal.        . ergocalciferol (VITAMIN D2) 50000 UNITS capsule Take 50,000 Units by mouth 2 (two) times a week. On Monday and Friday      . fenofibrate (TRICOR) 145 MG tablet Take 145 mg by mouth daily. Take 1/2 a tablet daily.      . furosemide (LASIX) 20 MG tablet Take 20 mg by mouth. Every other day      . furosemide (LASIX) 20 MG tablet TAKE ONE TABLET BY MOUTH EVERY OTHER DAY WITH POTASSIUM  30 tablet  5  . gabapentin (NEURONTIN) 300 MG capsule Take 300 mg by mouth 2 (two) times daily.      [name] levETIRAcetam (KEPPRA) 750 MG tablet Take 750 mg by mouth every 12 (twelve) hours.      [name] olmesartan (BENICAR) 40 MG tablet Take 40 mg by mouth daily.      . pantoprazole (PROTONIX) 40 MG tablet  Take 40 mg by mouth 2 (two) times daily before a meal.      . pantoprazole (PROTONIX) 40 MG tablet Take 40 mg by mouth 2 (two) times daily.      . potassium chloride SA (K-DUR,KLOR-CON) 20 MEQ tablet Take 20 mEq by mouth.      . simvastatin (ZOCOR) 40 MG tablet Take 20 mg by mouth at bedtime.       . sitaGLIPtan (JANUVIA) 100 MG tablet Take 100 mg by mouth daily.        . valACYclovir (VALTREX) 1000 MG tablet Take 1,000 mg by mouth every 8 (eight) hours.       No current facility-administered medications for this visit.    Allergies  Allergen Reactions  . Codeine Other (See Comments)    "puts me on a trip"  . Lactose Intolerance (Gi) Diarrhea  . Morphine And Related   . Valium Hives and Rash    Review of Systems negative  except from HPI and PMH  Physical Exam BP 98/70  Pulse 70  Ht 5' 4.5" (1.638 m)  Wt 142 lb (64.411 kg)  BMI 24.01 kg/m2 Well developed and well nourished in no acute distress HENT normal E scleral and icterus clear Neck Supple JV6-7 ; carotids brisk and full Clear to ausculatio*Regular rate and rhythm, no murmurs gallops or rub Soft with active bowel sounds No clubbing cyanosis none Edema Alert and oriented, grossly normal motor and sensory function Skin Warm and Dry   ecg AV pacing with LBBB pattern broadly Assessment and  Plan

## 2012-07-04 ENCOUNTER — Encounter (HOSPITAL_BASED_OUTPATIENT_CLINIC_OR_DEPARTMENT_OTHER): Payer: Medicare Other

## 2012-07-24 ENCOUNTER — Encounter (HOSPITAL_BASED_OUTPATIENT_CLINIC_OR_DEPARTMENT_OTHER): Payer: Medicare Other

## 2012-09-24 ENCOUNTER — Encounter: Payer: Self-pay | Admitting: Internal Medicine

## 2012-10-24 ENCOUNTER — Ambulatory Visit (INDEPENDENT_AMBULATORY_CARE_PROVIDER_SITE_OTHER): Payer: Medicare Other | Admitting: *Deleted

## 2012-10-24 DIAGNOSIS — I428 Other cardiomyopathies: Secondary | ICD-10-CM

## 2012-10-24 DIAGNOSIS — I5022 Chronic systolic (congestive) heart failure: Secondary | ICD-10-CM

## 2012-10-24 LAB — ICD DEVICE OBSERVATION
AL THRESHOLD: 0.75 V
ATRIAL PACING ICD: 69 pct
BAMS-0001: 180 {beats}/min
BAMS-0003: 70 {beats}/min
DEV-0020ICD: NEGATIVE
LV LEAD IMPEDENCE ICD: 837.5 Ohm
PACEART VT: 0
RV LEAD AMPLITUDE: 9.2 mv
RV LEAD IMPEDENCE ICD: 387.5 Ohm
TOT-0006: 20130213000000
TOT-0008: 0
TOT-0009: 0
TZAT-0001SLOWVT: 1
TZAT-0004SLOWVT: 8
TZAT-0012SLOWVT: 200 ms
TZAT-0013SLOWVT: 3
TZAT-0020SLOWVT: 1 ms
TZON-0005SLOWVT: 6
TZST-0001SLOWVT: 3
TZST-0001SLOWVT: 4
TZST-0001SLOWVT: 5
TZST-0003SLOWVT: 36 J
TZST-0003SLOWVT: 40 J
VENTRICULAR PACING ICD: 99.94 pct

## 2012-10-24 NOTE — Progress Notes (Signed)
ICD check in clinic. Normal device function. Thresholds and sensing consistent with previous device measurements. Impedance trends stable over time. No evidence of any ventricular arrhythmias. 2 mode switches 4-6 seconds.  Patient is not on coumadin due to hx of subdural hematoma.   Histogram distribution appropriate for patient and level of activity. No changes made this session. Device programmed at appropriate safety margins. Device programmed to optimize intrinsic conduction. Estimated longevity 4.8 years. Pt enrolled in remote follow-up. Plan to check device every 3 months remotely and in office annually. Patient education completed including shock plan. Alert tones/vibration demonstrated for patient.  ROV in [place] with Dr. Caryl Comes.  Merlin 01/27/13.

## 2012-11-12 ENCOUNTER — Encounter: Payer: Self-pay | Admitting: Internal Medicine

## 2013-01-27 ENCOUNTER — Encounter: Payer: Medicare Other | Admitting: *Deleted

## 2013-02-03 ENCOUNTER — Encounter: Payer: Self-pay | Admitting: *Deleted

## 2013-02-10 ENCOUNTER — Encounter: Payer: Self-pay | Admitting: Internal Medicine

## 2013-02-10 ENCOUNTER — Ambulatory Visit (INDEPENDENT_AMBULATORY_CARE_PROVIDER_SITE_OTHER): Payer: Medicare Other | Admitting: *Deleted

## 2013-02-10 DIAGNOSIS — I428 Other cardiomyopathies: Secondary | ICD-10-CM

## 2013-02-10 DIAGNOSIS — I480 Paroxysmal atrial fibrillation: Secondary | ICD-10-CM

## 2013-02-10 DIAGNOSIS — I4891 Unspecified atrial fibrillation: Secondary | ICD-10-CM

## 2013-02-10 DIAGNOSIS — I5022 Chronic systolic (congestive) heart failure: Secondary | ICD-10-CM

## 2013-02-10 LAB — MDC_IDC_ENUM_SESS_TYPE_REMOTE
Battery Remaining Longevity: 55 mo
Battery Remaining Percentage: 72 %
Brady Statistic AP VP Percent: 61 %
Brady Statistic AS VP Percent: 39 %
Brady Statistic RA Percent Paced: 61 %
HIGH POWER IMPEDANCE MEASURED VALUE: 40 Ohm
Lead Channel Impedance Value: 330 Ohm
Lead Channel Pacing Threshold Amplitude: 0.625 V
Lead Channel Pacing Threshold Amplitude: 1.25 V
Lead Channel Pacing Threshold Pulse Width: 0.5 ms
Lead Channel Pacing Threshold Pulse Width: 0.8 ms
Lead Channel Sensing Intrinsic Amplitude: 10.4 mV
Lead Channel Sensing Intrinsic Amplitude: 2.2 mV
Lead Channel Setting Pacing Amplitude: 1.625
Lead Channel Setting Pacing Amplitude: 2.5 V
Lead Channel Setting Pacing Pulse Width: 0.8 ms
Lead Channel Setting Sensing Sensitivity: 0.5 mV
MDC IDC MSMT BATTERY VOLTAGE: 2.96 V
MDC IDC MSMT LEADCHNL LV IMPEDANCE VALUE: 950 Ohm
MDC IDC MSMT LEADCHNL RA IMPEDANCE VALUE: 350 Ohm
MDC IDC MSMT LEADCHNL RA PACING THRESHOLD PULSEWIDTH: 0.5 ms
MDC IDC MSMT LEADCHNL RV PACING THRESHOLD AMPLITUDE: 0.625 V
MDC IDC PG SERIAL: 7025184
MDC IDC SESS DTM: 20150126144305
MDC IDC SET LEADCHNL RV PACING AMPLITUDE: 2 V
MDC IDC SET LEADCHNL RV PACING PULSEWIDTH: 0.5 ms
MDC IDC SET ZONE DETECTION INTERVAL: 280 ms
MDC IDC STAT BRADY AP VS PERCENT: 1 %
MDC IDC STAT BRADY AS VS PERCENT: 1 %
Zone Setting Detection Interval: 355 ms

## 2013-02-25 ENCOUNTER — Encounter: Payer: Self-pay | Admitting: *Deleted

## 2013-03-31 ENCOUNTER — Ambulatory Visit (INDEPENDENT_AMBULATORY_CARE_PROVIDER_SITE_OTHER): Payer: Medicare Other | Admitting: Internal Medicine

## 2013-03-31 ENCOUNTER — Encounter: Payer: Self-pay | Admitting: Internal Medicine

## 2013-03-31 VITALS — BP 137/73 | HR 71 | Ht 64.0 in | Wt 153.4 lb

## 2013-03-31 DIAGNOSIS — I5022 Chronic systolic (congestive) heart failure: Secondary | ICD-10-CM

## 2013-03-31 DIAGNOSIS — I480 Paroxysmal atrial fibrillation: Secondary | ICD-10-CM

## 2013-03-31 DIAGNOSIS — Z95810 Presence of automatic (implantable) cardiac defibrillator: Secondary | ICD-10-CM

## 2013-03-31 DIAGNOSIS — I4891 Unspecified atrial fibrillation: Secondary | ICD-10-CM

## 2013-03-31 DIAGNOSIS — I428 Other cardiomyopathies: Secondary | ICD-10-CM

## 2013-03-31 LAB — MDC_IDC_ENUM_SESS_TYPE_INCLINIC
Battery Remaining Longevity: 52.8 mo
Brady Statistic RA Percent Paced: 65 %
Date Time Interrogation Session: 20150316095728
HIGH POWER IMPEDANCE MEASURED VALUE: 43 Ohm
Implantable Pulse Generator Serial Number: 7025184
Lead Channel Impedance Value: 350 Ohm
Lead Channel Impedance Value: 362.5 Ohm
Lead Channel Impedance Value: 912.5 Ohm
Lead Channel Pacing Threshold Amplitude: 0.625 V
Lead Channel Pacing Threshold Amplitude: 1 V
Lead Channel Pacing Threshold Amplitude: 1 V
Lead Channel Pacing Threshold Pulse Width: 0.5 ms
Lead Channel Pacing Threshold Pulse Width: 0.5 ms
Lead Channel Pacing Threshold Pulse Width: 0.8 ms
Lead Channel Pacing Threshold Pulse Width: 0.8 ms
Lead Channel Sensing Intrinsic Amplitude: 2.1 mV
Lead Channel Setting Pacing Amplitude: 2 V
Lead Channel Setting Pacing Pulse Width: 0.5 ms
Lead Channel Setting Pacing Pulse Width: 0.8 ms
Lead Channel Setting Sensing Sensitivity: 0.5 mV
MDC IDC MSMT LEADCHNL RV PACING THRESHOLD AMPLITUDE: 0.625 V
MDC IDC MSMT LEADCHNL RV SENSING INTR AMPL: 9.5 mV
MDC IDC PG MODEL: 3257
MDC IDC SET LEADCHNL LV PACING AMPLITUDE: 2.5 V
MDC IDC SET LEADCHNL RA PACING AMPLITUDE: 1.75 V
MDC IDC SET ZONE DETECTION INTERVAL: 280 ms
MDC IDC STAT BRADY RV PERCENT PACED: 99.91 %
Zone Setting Detection Interval: 355 ms

## 2013-03-31 MED ORDER — SPIRONOLACTONE 12.5 MG HALF TABLET: 12.5000 mg | ORAL_TABLET | Freq: Every day | ORAL | Status: DC

## 2013-03-31 NOTE — Progress Notes (Signed)
Patient Care Team: Imran Gertie Baron as PCP - General (Internal Medicine)   HPI  [name] is a 73 y.o. female seen in followup for CRT-D implanted at [facility] for  nonischemic cardiomyopathy.She underwent CRT device generator replacement in Feb 2013 with insertion of new ICD lead 2/2 externalization of the previous RIATA lead  There has been intercurrent but transient normalization of left ventricular systolic function by an echo summer 2012 demonstrating an ejection fraction of 55%-60%. Repeat echo12/12 EF 35% We do not have these records as the patient was hospitalized at [facility] with subdural hematoma requiring craniotomy complicated by seizures.  AV optimization echo 12/13 done.  Chest x-ray demonstrates high anterior location Ef 30 % with global hypokinesis Myoview>>Normal perfusion  Catheterization done presumably before her ICD implanted at [facility] demonstrated some years ago no obstructive coronary disease  She does not like to complain. With pushing and probably knows she notes that she had some problems with weakness fatigue and tiredness  This has worsened over the last couple of months and years. She's had a little bit of edema with this.  She has significant daytime somnolence. She was not able to undertake a sleep study because of cost. She has had problems in the past with hyperkalemia Past Medical History  Diagnosis Date  . NICM (nonischemic cardiomyopathy)     itial EF of 35%. Normal in 07/2009; 35% 12/12  . COPD (chronic obstructive pulmonary disease)   . DM2 (diabetes mellitus, type 2)   . HLD (hyperlipidemia)   . Vitamin D deficiency   . Chronic systolic heart failure        . PAF (paroxysmal atrial fibrillation)   . Hypertension   . Subdural hematoma     12/12    . 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  s/p revision 2/2 ERI 02/28/2011 - [facility] CD 3257  . Seizures     Past Surgical History  Procedure Laterality Date    . Cardiac defibrillator placement  2006  . Total abdominal hysterectomy  1983  . Cardiac catheterization      no significant CAD  . Brain hematoma evacutation      Current Outpatient Prescriptions  Medication Sig Dispense Refill  . albuterol (PROVENTIL HFA;VENTOLIN HFA) 108 (90 BASE) MCG/ACT inhaler Inhale 2 puffs into the lungs every 6 (six) hours as needed. For wheezing      . carvedilol (COREG) 25 MG tablet Take 25 mg by mouth 2 (two) times daily with a meal.        . ergocalciferol (VITAMIN D2) 50000 UNITS capsule Take 50,000 Units by mouth 2 (two) times a week. On Monday and Friday      . fenofibrate (TRICOR) 145 MG tablet Take 145 mg by mouth daily. Take 1/2 a tablet daily.      . furosemide (LASIX) 20 MG tablet TAKE ONE TABLET BY MOUTH EVERY OTHER DAY WITH POTASSIUM  30 tablet  5  . levETIRAcetam (KEPPRA) 750 MG tablet Take 750 mg by mouth every 12 (twelve) hours.      . pantoprazole (PROTONIX) 40 MG tablet Take 40 mg by mouth 2 (two) times daily.      . potassium chloride (K-DUR) 10 MEQ tablet 2 tablets 2 (two) times daily.      . simvastatin (ZOCOR) 40 MG tablet Take 20 mg by mouth at bedtime.       . sitaGLIPtan (JANUVIA) 100 MG tablet Take 100  mg by mouth daily.         No current facility-administered medications for this visit.    Allergies  Allergen Reactions  . Codeine Other (See Comments)    "puts me on a trip"  . Lactose Intolerance (Gi) Diarrhea  . Morphine And Related   . Valium Hives and Rash    Review of Systems negative except from HPI and PMH  Physical Exam BP 137/73  Pulse 71  Ht 5\' 4"  (1.626 m)  Wt 153 lb 6.4 oz (69.582 kg)  BMI 26.32 kg/m2 Well developed and well nourished in no acute distress HENT normal E scleral and icterus clear Neck Supple JVP 6-8 carotids brisk and full Clear to ausculation  Regular rate and rhythm,2/6 murmur Soft with active bowel sounds No clubbing cyanosis Trace Edema Alert and oriented, grossly normal motor and  sensory function Skin Warm and Dry  ECG demonstrates P. Synchronous pacing with a narrow QRS (118 ms) and left bundle branch pattern Assessment and  Plan  Congestive heart failure-chronic-systolic  Nonischemic cardiomyopathy  Hypokalemia  Fatigue/daytime somnolence  CRT D.-St. Jude's anterior location of the LV and a very narrow QRS on ECG  Patient symptoms of heart failure may be attenuated in part by augmenting diuresis. Given her tendency to hypokalemia and her cardiomyopathy we will try Aldactone. We'll check a metabolic profile at the end of this week and 2 and half weeks time. She is not able to afford a sleep study.  I'm not sure whether she benefits from her LV pacing lead given the presence of her QRS which I suspect is quite narrow in a situation and the anterior location. Consider turning of the LV lead at her next visit if her symptoms have not intercurrently improved

## 2013-03-31 NOTE — Patient Instructions (Addendum)
Your physician has recommended you make the following change in your medication:  1) Stop Tricor 2) Start Aldactone 12.5 mg daily  Remote monitoring is used to monitor your Pacemaker of ICD from home. This monitoring reduces the number of office visits required to check your device to one time per year. It allows [place] to keep an eye on the functioning of your device to ensure it is working properly. You are scheduled for a device check from home on 07/02/13. You may send your transmission at any time that day. If you have a wireless device, the transmission will be sent automatically. After your physician reviews your transmission, you will receive a postcard with your next transmission date.  Your physician wants you to follow-up in: 1 year with Dr. Caryl Comes.  You will receive a reminder letter in the mail two months in advance. If you don't receive a letter, please call our office to schedule the follow-up appointment.  Handwritten rx given to you to have BMET drawn on 3/20 & 04/18/13 at Dr Tenna Delaine office

## 2013-04-08 ENCOUNTER — Other Ambulatory Visit: Payer: Self-pay | Admitting: Internal Medicine

## 2013-05-15 ENCOUNTER — Telehealth: Payer: Self-pay | Admitting: *Deleted

## 2013-05-15 NOTE — Telephone Encounter (Signed)
Called patient about lab results (last OV 3/16 we ordered BMET 3/20, 4/6 - showing K 4.4, 4.8 & Cr 1.46, 1.59), and discussed results. Explained that we would like to repeat lab next week. She is agreeable and would like order faxed to [facility] internal medicine again. I informed her that I would fax order over today and she is agreeable to stop by there next week to have lab drawn.

## 2013-07-02 ENCOUNTER — Encounter: Payer: Medicare Other | Admitting: *Deleted

## 2013-07-02 ENCOUNTER — Telehealth: Payer: Self-pay | Admitting: Cardiology

## 2013-07-02 NOTE — Telephone Encounter (Signed)
LMOVM reminding pt to send remote transmission.   

## 2013-07-03 ENCOUNTER — Encounter: Payer: Self-pay | Admitting: Cardiology

## 2013-07-07 ENCOUNTER — Telehealth: Payer: Self-pay | Admitting: Internal Medicine

## 2013-07-07 ENCOUNTER — Encounter: Payer: Self-pay | Admitting: Internal Medicine

## 2013-07-07 ENCOUNTER — Ambulatory Visit (INDEPENDENT_AMBULATORY_CARE_PROVIDER_SITE_OTHER): Payer: Medicare Other | Admitting: *Deleted

## 2013-07-07 DIAGNOSIS — I5022 Chronic systolic (congestive) heart failure: Secondary | ICD-10-CM

## 2013-07-07 DIAGNOSIS — I428 Other cardiomyopathies: Secondary | ICD-10-CM

## 2013-07-07 LAB — MDC_IDC_ENUM_SESS_TYPE_REMOTE
Brady Statistic RV Percent Paced: 99 %
Implantable Pulse Generator Model: 3257
Implantable Pulse Generator Serial Number: 7025184
Lead Channel Impedance Value: 350 Ohm
Lead Channel Impedance Value: 950 Ohm
Lead Channel Pacing Threshold Pulse Width: 0.5 ms
Lead Channel Pacing Threshold Pulse Width: 0.5 ms
Lead Channel Sensing Intrinsic Amplitude: 2.9 mV
Lead Channel Setting Pacing Amplitude: 1.75 V
Lead Channel Setting Pacing Amplitude: 2 V
Lead Channel Setting Sensing Sensitivity: 0.5 mV
MDC IDC MSMT LEADCHNL RA PACING THRESHOLD AMPLITUDE: 0.625 V
MDC IDC MSMT LEADCHNL RV IMPEDANCE VALUE: 350 Ohm
MDC IDC MSMT LEADCHNL RV PACING THRESHOLD AMPLITUDE: 0.75 V
MDC IDC MSMT LEADCHNL RV SENSING INTR AMPL: 4.3 mV
MDC IDC SET LEADCHNL LV PACING AMPLITUDE: 2.5 V
MDC IDC SET LEADCHNL LV PACING PULSEWIDTH: 0.8 ms
MDC IDC SET LEADCHNL RV PACING PULSEWIDTH: 0.5 ms
MDC IDC STAT BRADY RA PERCENT PACED: 56 %
Zone Setting Detection Interval: 280 ms
Zone Setting Detection Interval: 355 ms

## 2013-07-07 NOTE — Progress Notes (Signed)
Remote ICD transmission.   

## 2013-07-07 NOTE — Telephone Encounter (Signed)
New message     Trying to send a remote transmission and the box is beeping.  Please call

## 2013-07-07 NOTE — Telephone Encounter (Signed)
Spoke with pt and she allowed me to hear what her device was doing. I informed pt to call tech support to help her with troubleshooting her monitor. Pt verbalized understanding.

## 2013-07-14 ENCOUNTER — Other Ambulatory Visit: Payer: Self-pay | Admitting: Internal Medicine

## 2013-07-22 ENCOUNTER — Encounter: Payer: Self-pay | Admitting: Cardiology

## 2013-10-08 ENCOUNTER — Ambulatory Visit (INDEPENDENT_AMBULATORY_CARE_PROVIDER_SITE_OTHER): Payer: Medicare Other | Admitting: *Deleted

## 2013-10-08 ENCOUNTER — Encounter: Payer: Self-pay | Admitting: Internal Medicine

## 2013-10-08 ENCOUNTER — Telehealth: Payer: Self-pay | Admitting: Cardiology

## 2013-10-08 DIAGNOSIS — I5022 Chronic systolic (congestive) heart failure: Secondary | ICD-10-CM

## 2013-10-08 DIAGNOSIS — I428 Other cardiomyopathies: Secondary | ICD-10-CM

## 2013-10-08 NOTE — Telephone Encounter (Signed)
Spoke with pt and reminded pt of remote transmission that is due today. Pt verbalized understanding.   

## 2013-10-08 NOTE — Progress Notes (Signed)
Remote ICD transmission.   

## 2013-10-10 LAB — MDC_IDC_ENUM_SESS_TYPE_REMOTE
Battery Remaining Percentage: 63 %
Battery Voltage: 2.95 V
Brady Statistic AP VP Percent: 58 %
Brady Statistic AS VP Percent: 42 %
Brady Statistic AS VS Percent: 1 %
Brady Statistic RA Percent Paced: 58 %
Date Time Interrogation Session: 20150923183920
HighPow Impedance: 47 Ohm
Implantable Pulse Generator Model: 3257
Lead Channel Impedance Value: 350 Ohm
Lead Channel Pacing Threshold Amplitude: 0.625 V
Lead Channel Pacing Threshold Amplitude: 0.625 V
Lead Channel Pacing Threshold Amplitude: 1 V
Lead Channel Pacing Threshold Pulse Width: 0.5 ms
Lead Channel Pacing Threshold Pulse Width: 0.8 ms
Lead Channel Sensing Intrinsic Amplitude: 2.9 mV
Lead Channel Sensing Intrinsic Amplitude: 5.1 mV
Lead Channel Setting Pacing Amplitude: 1.625
Lead Channel Setting Pacing Pulse Width: 0.5 ms
Lead Channel Setting Pacing Pulse Width: 0.8 ms
MDC IDC MSMT BATTERY REMAINING LONGEVITY: 49 mo
MDC IDC MSMT LEADCHNL LV IMPEDANCE VALUE: 950 Ohm
MDC IDC MSMT LEADCHNL RA IMPEDANCE VALUE: 390 Ohm
MDC IDC MSMT LEADCHNL RA PACING THRESHOLD PULSEWIDTH: 0.5 ms
MDC IDC PG SERIAL: 7025184
MDC IDC SET LEADCHNL LV PACING AMPLITUDE: 2.5 V
MDC IDC SET LEADCHNL RV PACING AMPLITUDE: 2 V
MDC IDC SET LEADCHNL RV SENSING SENSITIVITY: 0.5 mV
MDC IDC STAT BRADY AP VS PERCENT: 1 %
Zone Setting Detection Interval: 280 ms
Zone Setting Detection Interval: 355 ms

## 2013-10-20 ENCOUNTER — Encounter: Payer: Self-pay | Admitting: Cardiology

## 2013-12-25 ENCOUNTER — Encounter (HOSPITAL_COMMUNITY): Payer: Self-pay | Admitting: Internal Medicine

## 2014-01-12 ENCOUNTER — Ambulatory Visit (INDEPENDENT_AMBULATORY_CARE_PROVIDER_SITE_OTHER): Payer: Medicare Other | Admitting: *Deleted

## 2014-01-12 ENCOUNTER — Telehealth: Payer: Self-pay | Admitting: Cardiology

## 2014-01-12 DIAGNOSIS — I428 Other cardiomyopathies: Secondary | ICD-10-CM

## 2014-01-12 DIAGNOSIS — I429 Cardiomyopathy, unspecified: Secondary | ICD-10-CM

## 2014-01-12 DIAGNOSIS — I5022 Chronic systolic (congestive) heart failure: Secondary | ICD-10-CM

## 2014-01-12 LAB — MDC_IDC_ENUM_SESS_TYPE_REMOTE
Battery Remaining Longevity: 46 mo
Battery Remaining Percentage: 60 %
Brady Statistic AP VP Percent: 57 %
Brady Statistic AS VP Percent: 43 %
Brady Statistic AS VS Percent: 1 %
Brady Statistic RA Percent Paced: 57 %
Date Time Interrogation Session: 20151228233557
HIGH POWER IMPEDANCE MEASURED VALUE: 44 Ohm
Implantable Pulse Generator Model: 3257
Implantable Pulse Generator Serial Number: 7025184
Lead Channel Impedance Value: 350 Ohm
Lead Channel Impedance Value: 390 Ohm
Lead Channel Impedance Value: 860 Ohm
Lead Channel Pacing Threshold Amplitude: 0.75 V
Lead Channel Pacing Threshold Amplitude: 1 V
Lead Channel Pacing Threshold Pulse Width: 0.5 ms
Lead Channel Pacing Threshold Pulse Width: 0.5 ms
Lead Channel Sensing Intrinsic Amplitude: 3 mV
Lead Channel Setting Pacing Amplitude: 1.75 V
Lead Channel Setting Pacing Amplitude: 2 V
Lead Channel Setting Pacing Amplitude: 2.5 V
Lead Channel Setting Pacing Pulse Width: 0.5 ms
MDC IDC MSMT BATTERY VOLTAGE: 2.93 V
MDC IDC MSMT LEADCHNL LV PACING THRESHOLD PULSEWIDTH: 0.8 ms
MDC IDC MSMT LEADCHNL RV PACING THRESHOLD AMPLITUDE: 0.75 V
MDC IDC SET LEADCHNL LV PACING PULSEWIDTH: 0.8 ms
MDC IDC SET LEADCHNL RV SENSING SENSITIVITY: 0.5 mV
MDC IDC STAT BRADY AP VS PERCENT: 1 %
Zone Setting Detection Interval: 280 ms
Zone Setting Detection Interval: 355 ms

## 2014-01-12 NOTE — Telephone Encounter (Signed)
Spoke with pt and reminded pt of remote transmission that is due today. Pt verbalized understanding.   

## 2014-01-13 NOTE — Progress Notes (Signed)
Remote ICD transmission.   

## 2014-01-30 ENCOUNTER — Encounter: Payer: Self-pay | Admitting: Cardiology

## 2014-02-04 ENCOUNTER — Encounter: Payer: Self-pay | Admitting: Internal Medicine

## 2014-02-26 ENCOUNTER — Encounter: Payer: Self-pay | Admitting: Nurse Practitioner

## 2014-03-09 ENCOUNTER — Other Ambulatory Visit: Payer: Self-pay | Admitting: Internal Medicine

## 2014-03-10 ENCOUNTER — Ambulatory Visit: Payer: Self-pay | Admitting: Physician Assistant

## 2014-03-16 ENCOUNTER — Ambulatory Visit: Payer: Self-pay | Admitting: Nurse Practitioner

## 2014-03-20 ENCOUNTER — Ambulatory Visit: Payer: Self-pay | Admitting: Physician Assistant

## 2014-03-23 ENCOUNTER — Encounter: Payer: Self-pay | Admitting: Internal Medicine

## 2014-03-23 ENCOUNTER — Telehealth: Payer: Self-pay | Admitting: *Deleted

## 2014-03-23 ENCOUNTER — Ambulatory Visit (INDEPENDENT_AMBULATORY_CARE_PROVIDER_SITE_OTHER): Payer: Medicare HMO | Admitting: *Deleted

## 2014-03-23 ENCOUNTER — Ambulatory Visit (INDEPENDENT_AMBULATORY_CARE_PROVIDER_SITE_OTHER): Payer: Medicare HMO | Admitting: Physician Assistant

## 2014-03-23 ENCOUNTER — Encounter: Payer: Self-pay | Admitting: Physician Assistant

## 2014-03-23 ENCOUNTER — Ambulatory Visit
Admission: RE | Admit: 2014-03-23 | Discharge: 2014-03-23 | Disposition: A | Discharge status: Home / Self Care | Payer: Medicare HMO | Source: Ambulatory Visit | Attending: Physician Assistant | Admitting: Physician Assistant

## 2014-03-23 VITALS — BP 114/60 | HR 72 | Ht 64.0 in | Wt 158.8 lb

## 2014-03-23 DIAGNOSIS — R569 Unspecified convulsions: Secondary | ICD-10-CM | POA: Insufficient documentation

## 2014-03-23 DIAGNOSIS — R0600 Dyspnea, unspecified: Secondary | ICD-10-CM

## 2014-03-23 DIAGNOSIS — I429 Cardiomyopathy, unspecified: Secondary | ICD-10-CM

## 2014-03-23 DIAGNOSIS — E876 Hypokalemia: Secondary | ICD-10-CM | POA: Insufficient documentation

## 2014-03-23 DIAGNOSIS — I5022 Chronic systolic (congestive) heart failure: Secondary | ICD-10-CM

## 2014-03-23 DIAGNOSIS — Z01810 Encounter for preprocedural cardiovascular examination: Secondary | ICD-10-CM

## 2014-03-23 DIAGNOSIS — I10 Essential (primary) hypertension: Secondary | ICD-10-CM

## 2014-03-23 DIAGNOSIS — S065X9A Traumatic subdural hemorrhage with loss of consciousness of unspecified duration, initial encounter: Secondary | ICD-10-CM | POA: Insufficient documentation

## 2014-03-23 DIAGNOSIS — Z95810 Presence of automatic (implantable) cardiac defibrillator: Secondary | ICD-10-CM | POA: Diagnosis not present

## 2014-03-23 DIAGNOSIS — K625 Hemorrhage of anus and rectum: Secondary | ICD-10-CM

## 2014-03-23 DIAGNOSIS — I428 Other cardiomyopathies: Secondary | ICD-10-CM

## 2014-03-23 DIAGNOSIS — I480 Paroxysmal atrial fibrillation: Secondary | ICD-10-CM

## 2014-03-23 DIAGNOSIS — R5383 Other fatigue: Secondary | ICD-10-CM

## 2014-03-23 DIAGNOSIS — I447 Left bundle-branch block, unspecified: Secondary | ICD-10-CM | POA: Insufficient documentation

## 2014-03-23 LAB — BASIC METABOLIC PANEL
BUN: 25 mg/dL — AB (ref 6–23)
CALCIUM: 9.7 mg/dL (ref 8.4–10.5)
CO2: 26 mEq/L (ref 19–32)
Chloride: 106 mEq/L (ref 96–112)
Creatinine, Ser: 1.79 mg/dL — ABNORMAL HIGH (ref 0.40–1.20)
GFR: 29.34 mL/min — AB (ref >60.00)
GLUCOSE: 94 mg/dL (ref 70–99)
Potassium: 4.8 mEq/L (ref 3.5–5.1)
Sodium: 140 mEq/L (ref 135–145)

## 2014-03-23 LAB — MDC_IDC_ENUM_SESS_TYPE_INCLINIC
Battery Remaining Longevity: 44.4 mo
Brady Statistic RA Percent Paced: 58 %
Brady Statistic RV Percent Paced: 99.74 %
Date Time Interrogation Session: 20160307101434
HIGH POWER IMPEDANCE MEASURED VALUE: 48.6429
Implantable Pulse Generator Model: 3257
Lead Channel Impedance Value: 300 Ohm
Lead Channel Pacing Threshold Amplitude: 0.5 V
Lead Channel Pacing Threshold Amplitude: 0.75 V
Lead Channel Pacing Threshold Amplitude: 1 V
Lead Channel Pacing Threshold Amplitude: 1 V
Lead Channel Pacing Threshold Pulse Width: 0.5 ms
Lead Channel Pacing Threshold Pulse Width: 0.5 ms
Lead Channel Pacing Threshold Pulse Width: 0.8 ms
Lead Channel Sensing Intrinsic Amplitude: 2.5 mV
Lead Channel Sensing Intrinsic Amplitude: 9.8 mV
Lead Channel Setting Pacing Amplitude: 1.75 V
Lead Channel Setting Pacing Amplitude: 2 V
Lead Channel Setting Pacing Pulse Width: 0.5 ms
Lead Channel Setting Pacing Pulse Width: 0.8 ms
MDC IDC MSMT LEADCHNL LV IMPEDANCE VALUE: 962.5 Ohm
MDC IDC MSMT LEADCHNL LV PACING THRESHOLD PULSEWIDTH: 0.8 ms
MDC IDC MSMT LEADCHNL RA IMPEDANCE VALUE: 400 Ohm
MDC IDC PG SERIAL: 7025184
MDC IDC SET LEADCHNL RV PACING AMPLITUDE: 2 V
MDC IDC SET LEADCHNL RV SENSING SENSITIVITY: 0.5 mV
MDC IDC SET ZONE DETECTION INTERVAL: 355 ms
Zone Setting Detection Interval: 280 ms

## 2014-03-23 LAB — CBC WITH DIFFERENTIAL/PLATELET
Basophils Absolute: 0 10*3/uL (ref 0.0–0.1)
Basophils Relative: 1 % (ref 0.0–3.0)
EOS ABS: 0.2 10*3/uL (ref 0.0–0.7)
EOS PCT: 5.1 % — AB (ref 0.0–5.0)
HCT: 37.2 % (ref 36.0–46.0)
HEMOGLOBIN: 12.8 g/dL (ref 12.0–15.0)
LYMPHS PCT: 26.9 % (ref 12.0–46.0)
Lymphs Abs: 1.1 10*3/uL (ref 0.7–4.0)
MCHC: 34.3 g/dL (ref 30.0–36.0)
MCV: 89 fl (ref 78.0–100.0)
MONOS PCT: 10.1 % (ref 3.0–12.0)
Monocytes Absolute: 0.4 10*3/uL (ref 0.1–1.0)
NEUTROS ABS: 2.3 10*3/uL (ref 1.4–7.7)
NEUTROS PCT: 56.9 % (ref 43.0–77.0)
Platelets: 152 10*3/uL (ref 150.0–400.0)
RBC: 4.18 Mil/uL (ref 3.87–5.11)
RDW: 13.7 % (ref 11.5–15.5)
WBC: 4.1 10*3/uL (ref 4.0–10.5)

## 2014-03-23 LAB — TSH: TSH: 3.2 u[IU]/mL (ref 0.35–4.50)

## 2014-03-23 LAB — BRAIN NATRIURETIC PEPTIDE: PRO B NATRI PEPTIDE: 52 pg/mL (ref 0.0–100.0)

## 2014-03-23 NOTE — Progress Notes (Signed)
CRT-D device check in office. Thresholds and sensing consistent with previous device measurements. Lead impedance trends stable over time. 8 mode switch episodes recorded--- <1%, longest 14sec. No ventricular arrhythmia episodes recorded. Patient bi-ventricularly pacing >99% of the time. Device programmed with appropriate safety margins. Heart failure diagnostics reviewed and trends are stable for patient---CorVue abnormal 1/10 x13d. Vibratory alerts demonstrated for patient, pt knows to call clinic if felt. No changes made this session. Estimated longevity 3.55yrs. ROV w/ Dr. Caryl Comes 05/25/14.

## 2014-03-23 NOTE — Telephone Encounter (Signed)
pt notified about cxr and lab results with verbal understanding, Pt aware to hold lasix, K+ and spironolactone until Melina Copa, PA s/w Dr. Caryl Comes. pt said ok and thank you.

## 2014-03-23 NOTE — Patient Instructions (Addendum)
Your physician recommends that you continue on your current medications as directed. Please refer to the Current Medication list given to you today.  Labs Today: Bmet, Tsh, Bnp, Cbc  Your physician has requested that you have an echocardiogram. Echocardiography is a painless test that uses sound waves to create images of your heart. It provides your doctor with information about the size and shape of your heart and how well your heart's chambers and valves are working. This procedure takes approximately one hour. There are no restrictions for this procedure.  A chest x-ray takes a picture of the organs and structures inside the chest, including the heart, lungs, and blood vessels. This test can show several things, including, whether the heart is enlarges; whether fluid is building up in the lungs; and whether pacemaker / defibrillator leads are still in place.(TO BE DONE AT [facility] [address], 1st FLOOR)  Your physician has requested that you have a lexiscan myoview. For further information please visit [email or web address]. Please follow instruction sheet, as given.  FOLLOW UP AS PLANNED WITH DR.KLEIN ON 05/24/13

## 2014-03-23 NOTE — Progress Notes (Signed)
Cardiology Office Note Date:  03/23/2014  Patient ID:  [name], [name] 31-Aug-1940, MRN [id number] PCP:  Bonnita Nasuti, MD  Cardiologist:  Previously Fletcher Anon in [place] Electrophysiologist: Caryl Comes  Chief Complaint: pre-op clearance for cataract surgery (Dr. Talbert Forest) - also noting fatigue and dyspnea  History of Present Illness: [name] is a 74 y.o. female with history of NICM, chronic systolic CHF, LBBB, BiV ICD upgrade in 02/2011, hypokalemia, seizures, SDH, hypokalemia, PAF who presents for cardiac clearance. Per Dr. Olin Pia notes, catheterization done presumably before her ICD implanted at [facility] demonstrated some years ago no obstructive coronary disease. Nuc 12/2011: normal. 2D Echo 12/2011: EF 30%, diffuse HK, grade 1 DD, small-mod pericardial effusion, mild pulm HTN. She is not on anticoagulation due to h/o SDH for which she was hospitalized at [facility], requiring craniotomy, complicated by seizures. She was last seen by Dr. Caryl Comes 03/2013 at which time she was noting fatigue. Dr. Olin Pia note 03/2013: "I'm not sure whether she benefits from her LV pacing lead given the presence of her QRS which I suspect is quite narrow in a situation and the anterior location. Consider turning of the LV lead at her next visit if her symptoms have not intercurrently improved." Sleep study was recommended but she was unable to afford this.  She comes in for pre-operative evaluation of right eye cataract surgery tentatively scheduled for March 21 and left eye surgery scheduled for April. However, she has noticed continued functional decline. She reports DOE with even low level activities such as walking to the mailbox. She has rare chest pain "when I do too much" - this has been present and unchanged for several years including at time when she had normal nuc in 2013. She has not had any recently. Weight gradually climbing over the last several years. Reports occasional "knot" sensation in her right middle belly. She  has noticed scarce rectal bleeding and it sounds like FOBT+ was positive at her PCP's office. She is being referred soon for colonoscopy. No brisk bleeding. No syncope, ICD discharge, or significant LEE.  Past Medical History  Diagnosis Date  . NICM (nonischemic cardiomyopathy)     a. Initial EF of 35%. b. Normal in 07/2009. c. 35% in 2012, 30% in 12/2011. Reported h/o catheterization done presumably before her ICD implanted at [facility] demonstrated some years ago no obstructive coronary disease. Neg nuc 12/2011.  [name] COPD (chronic obstructive pulmonary disease)     a. pt reports "scarring" at the lungs due to double PNA in the past.  . DM2 (diabetes mellitus, type 2)   . HLD (hyperlipidemia)   . Vitamin D deficiency   . Chronic systolic heart failure        . PAF (paroxysmal atrial fibrillation)     a. Not on anticoag due to h/o SDH.  [name] Hypertension   . Subdural hematoma     a. 12/12. Hospitalized at [facility], requiring craniotomy, complicated by seizures.  . 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  s/p revision 2/2 ERI 02/28/2011 - [facility] CD 3257  . Seizures   . Hypokalemia   . LBBB (left bundle branch block)     Past Surgical History  Procedure Laterality Date  . Cardiac defibrillator placement  2006  . Total abdominal hysterectomy  1983  . Cardiac catheterization      no significant CAD  . Brain hematoma evacuation    . Implantable cardioverter defibrillator (icd) generator change N/A 03/01/2011  Procedure: ICD GENERATOR CHANGE;  Surgeon: Deboraha Sprang, MD;  Location: [facility] CATH LAB;  Service: Cardiovascular;  Laterality: N/A;    Current Outpatient Prescriptions  Medication Sig Dispense Refill  . albuterol (PROVENTIL HFA;VENTOLIN HFA) 108 (90 BASE) MCG/ACT inhaler Inhale 2 puffs into the lungs every 6 (six) hours as needed. For wheezing    . carvedilol (COREG) 25 MG tablet Take 25 mg by mouth 2 (two) times daily with a meal.      . ergocalciferol  (VITAMIN D2) 50000 UNITS capsule Take 50,000 Units by mouth 2 (two) times a week. On Monday and Friday    . Fluticasone Furoate-Vilanterol 100-25 MCG/INH AEPB Inhale 1 spray into the lungs daily.    . furosemide (LASIX) 20 MG tablet TAKE ONE TABLET BY MOUTH EVERY OTHER DAY WITH POTASSIUM 30 tablet 1  . HYDROcodone-acetaminophen (NORCO/VICODIN) 5-325 MG per tablet Take 1 tablet by mouth every 8 (eight) hours as needed for moderate pain.    [name] levETIRAcetam (KEPPRA) 750 MG tablet Take 750 mg by mouth every 12 (twelve) hours.    . pantoprazole (PROTONIX) 40 MG tablet Take 40 mg by mouth 2 (two) times daily.    . potassium chloride (K-DUR) 10 MEQ tablet 2 tablets 2 (two) times daily.    . simvastatin (ZOCOR) 40 MG tablet Take 20 mg by mouth at bedtime.     . sitaGLIPtan (JANUVIA) 100 MG tablet Take 100 mg by mouth daily.      [name] spironolactone (ALDACTONE) 25 MG tablet TAKE 1/2 TABLET BY MOUTH EVERY DAY 15 tablet 1   No current facility-administered medications for this visit.    Allergies:   Codeine; Lactose intolerance (gi); Morphine and related; and Valium   Social History:  The patient  reports that she quit smoking about 46 years ago. She has never used smokeless tobacco. She reports that she does not drink alcohol or use illicit drugs.   Family History:  The patient's family history includes Heart disease in her father, mother, and another family member.  ROS:  Please see the history of present illness.  All other systems are reviewed and otherwise negative.   PHYSICAL EXAM:  VS:  BP 114/60 mmHg  Pulse 72  Ht 5\' 4"  (1.626 m)  Wt 158 lb 12.8 oz (72.031 kg)  BMI 27.24 kg/m2 BMI: Body mass index is 27.24 kg/(m^2). Well nourished, well developed WF, in no acute distress, pleasant, smiling HEENT: normocephalic, atraumatic Neck: no JVD Cardiac:  normal S1, S2; RRR; no murmur Lungs: slightly diminished at bases, otherwise clear to auscultation bilaterally, no wheezing, rhonchi or rales Abd:  soft, nontender, no hepatomegaly Ext: minimal trace sockline edema Skin: warm and dry Neuro:  moves all extremities spontaneously, no focal abnormalities noted  EKG:  V paced 72bpm, nonspecific ST-T changes, QRS 124ms  Recent Labs: No results found for requested labs within last 365 days.  No results found for requested labs within last 365 days.   CrCl cannot be calculated (Patient has no serum creatinine result on file.).   Wt Readings from Last 3 Encounters:  03/23/14 158 lb 12.8 oz (72.031 kg)  03/31/13 153 lb 6.4 oz (69.582 kg)  06/11/12 142 lb (64.411 kg)     Other studies reviewed: Additional studies/records reviewed today include: summarized above  ASSESSMENT AND PLAN:  1. Dyspnea/fatigue- symptoms have progressed since last year. No reported history of CAD. It's been just over 2 years since her last evaluation. Will update echocardiogram. Given rare chest discomfort will  also update nuclear stress testing. Check labs to include BMET, CBC, TSH, BNP. Will also obtain updated CXR. Device interrogation shows normal function, no significant events, CorVue is OK. Based on the above information will plan to confer with Dr. Caryl Comes to determine most appropriate plan of action. He previously mentioned turning of the LV lead if her symptoms had not improved. Note she is not historically on an ACEI. Will hold off given low-normal BP and complaints of fatigue so as not to complicate the picture. If above workup unrevealing would revisit the suggestion of sleep study. Salt/fluid restriction reinforced. 2. Pre-operative cardiovascular evaluation - given the aforementioned complaints will defer clearance until more information available. 3. Chronic systolic CHF/NICM s/p BiV-ICD - see above. 4. Essential HTN - controlled.  5. PAF - not on anticoagulation due to history of SDH. With recent question of rectal bleeding as well, would not revisit this issue at present time. NSR today.  6. H/o  hypokalemia - recheck labs today. 7. Rectal bleeding - she is awaiting scheduling of a colonoscopy for which she states we will also be receiving pre-operative evaluation paperwork.  Disposition: Will tentatively keep f/u with Dr. Caryl Comes 05/2014.  Current medicines are reviewed at length with the patient today.  The patient did not have any concerns regarding medicines.  Raechel Ache PA-C 03/23/2014 9:54 AM     [facility] [place] [address] [phone] (office)  [phone] (fax)

## 2014-03-24 ENCOUNTER — Other Ambulatory Visit: Payer: Self-pay | Admitting: *Deleted

## 2014-03-24 DIAGNOSIS — I10 Essential (primary) hypertension: Secondary | ICD-10-CM

## 2014-03-24 MED ORDER — POTASSIUM CHLORIDE CRYS ER 20 MEQ PO TBCR
20.0000 meq | EXTENDED_RELEASE_TABLET | Freq: Every day | ORAL | Status: DC
Start: 2014-03-24 — End: 2014-07-28

## 2014-03-25 ENCOUNTER — Encounter: Payer: Self-pay | Admitting: Internal Medicine

## 2014-03-30 ENCOUNTER — Telehealth: Payer: Self-pay | Admitting: Internal Medicine

## 2014-03-30 ENCOUNTER — Other Ambulatory Visit (INDEPENDENT_AMBULATORY_CARE_PROVIDER_SITE_OTHER): Payer: Medicare HMO | Admitting: *Deleted

## 2014-03-30 DIAGNOSIS — I10 Essential (primary) hypertension: Secondary | ICD-10-CM

## 2014-03-30 LAB — BASIC METABOLIC PANEL
BUN: 26 mg/dL — AB (ref 6–23)
CO2: 27 mEq/L (ref 19–32)
CREATININE: 1.46 mg/dL — AB (ref 0.40–1.20)
Calcium: 9.3 mg/dL (ref 8.4–10.5)
Chloride: 108 mEq/L (ref 96–112)
GFR: 37.12 mL/min — ABNORMAL LOW (ref >60.00)
Glucose, Bld: 123 mg/dL — ABNORMAL HIGH (ref 70–99)
Potassium: 3.6 mEq/L (ref 3.5–5.1)
Sodium: 143 mEq/L (ref 135–145)

## 2014-03-30 NOTE — Telephone Encounter (Signed)
New problem    Need to know status of clearance that was faxed over 02/20/14. Please advise.

## 2014-03-30 NOTE — Addendum Note (Signed)
Addended by: Eulis Foster on: 03/30/2014 09:57 AM   Modules accepted: Orders

## 2014-03-30 NOTE — Telephone Encounter (Signed)
Informed Sharyn Lull at [facility] that patient has testing scheduled for 3/17.  Explained that clearance will be based upon outcomes of this testing. She understands I will call her Thursday afternoon after Dr. Caryl Comes has been able to review.

## 2014-04-02 ENCOUNTER — Ambulatory Visit (HOSPITAL_COMMUNITY): Payer: Medicare HMO

## 2014-04-02 ENCOUNTER — Ambulatory Visit (HOSPITAL_COMMUNITY): Payer: Medicare HMO | Admitting: Radiology

## 2014-04-02 ENCOUNTER — Encounter (HOSPITAL_COMMUNITY): Payer: Self-pay | Admitting: Radiology

## 2014-04-02 NOTE — Progress Notes (Signed)
Echocardiogram and nuclear medicine stress test not performed due to patient illness. Patient was suffering from diarrhea and vomiting. Patient was sent home. Will reschedule tests and will notify Dayna Dunn.

## 2014-04-02 NOTE — Telephone Encounter (Signed)
Michelle checking on status of testing patient was to have today. Informed her that patient was sent home due to illness, nausea & vomiting. Tests rescheduled for 3/28. Sharyn Lull is aware of new test dates and will schedule patient procedure for April. She and I agreed to speak after tests completed.

## 2014-04-06 NOTE — Telephone Encounter (Signed)
lmptcb to go over the recommendations from Melina Copa, [place] ok to resume lasix at 20 mg QOD and K+ at 20 meq daily. Still hold spironolactone.

## 2014-04-06 NOTE — Telephone Encounter (Signed)
pt aware to resume the lasix 20 mg QOD and K+ 20 meq daily. Pt said thank you for checking on her.

## 2014-04-13 ENCOUNTER — Ambulatory Visit (HOSPITAL_BASED_OUTPATIENT_CLINIC_OR_DEPARTMENT_OTHER): Payer: Medicare HMO | Admitting: Radiology

## 2014-04-13 ENCOUNTER — Ambulatory Visit (HOSPITAL_COMMUNITY): Payer: Medicare HMO | Attending: Internal Medicine | Admitting: Radiology

## 2014-04-13 ENCOUNTER — Other Ambulatory Visit (HOSPITAL_COMMUNITY): Payer: Self-pay | Admitting: Physician Assistant

## 2014-04-13 VITALS — BP 142/76 | HR 71 | Ht 64.0 in | Wt 158.0 lb

## 2014-04-13 DIAGNOSIS — I480 Paroxysmal atrial fibrillation: Secondary | ICD-10-CM | POA: Diagnosis not present

## 2014-04-13 DIAGNOSIS — E119 Type 2 diabetes mellitus without complications: Secondary | ICD-10-CM | POA: Diagnosis not present

## 2014-04-13 DIAGNOSIS — J449 Chronic obstructive pulmonary disease, unspecified: Secondary | ICD-10-CM | POA: Insufficient documentation

## 2014-04-13 DIAGNOSIS — R079 Chest pain, unspecified: Secondary | ICD-10-CM

## 2014-04-13 DIAGNOSIS — I10 Essential (primary) hypertension: Secondary | ICD-10-CM | POA: Diagnosis not present

## 2014-04-13 DIAGNOSIS — I5022 Chronic systolic (congestive) heart failure: Secondary | ICD-10-CM | POA: Diagnosis not present

## 2014-04-13 MED ORDER — REGADENOSON 0.4 MG/5ML IV SOLN
0.4000 mg | Freq: Once | INTRAVENOUS | Status: AC
  Administered 2014-04-13: 0.4 mg via INTRAVENOUS

## 2014-04-13 MED ORDER — TECHNETIUM TC 99M SESTAMIBI GENERIC - CARDIOLITE
33.0000 | Freq: Once | INTRAVENOUS | Status: AC | PRN
  Administered 2014-04-13: 33 via INTRAVENOUS

## 2014-04-13 MED ORDER — TECHNETIUM TC 99M SESTAMIBI GENERIC - CARDIOLITE
11.0000 | Freq: Once | INTRAVENOUS | Status: AC | PRN
  Administered 2014-04-13: 11 via INTRAVENOUS

## 2014-04-13 NOTE — Progress Notes (Signed)
Echocardiogram performed.  

## 2014-04-13 NOTE — Progress Notes (Signed)
[facility] [address] [place],  [address] [phone]    Cardiology Nuclear Med Study  [name] is a 74 y.o. female     MRN : [id number]     DOB: January 02, 1941  Procedure Date: 04/13/2014  Nuclear Med Background Indication for Stress Test:  Evaluation for Ischemia and Surgical Clearance:Cataract surgery with Dr. Talbert Forest History:  MPI 2013 (normal) AICD, PTVP, COPD, Afib (PAF) Cardiac Risk Factors: Hypertension, LBBB, Lipids and NIDDM  Symptoms:  Chest Pain, DOE and Fatigue   Nuclear Pre-Procedure Caffeine/Decaff Intake:  8:00pm NPO After: 8:00pm   Lungs:  clear O2 Sat: 98% on room air. IV 0.9% NS with Angio Cath:  22g  IV Site: R Hand  IV Started by:  Matilde Haymaker, RN  Chest Size (in):  34 Cup Size: A  Height: 5\' 4"  (1.626 m)  Weight:  158 lb (71.668 kg)  BMI:  Body mass index is 27.11 kg/(m^2). Tech Comments:  No Coreg x 14 hrs    Nuclear Med Study 1 or 2 day study: 1 day  Stress Test Type:  Carlton Adam  Reading MD: n/a  Order Authorizing Provider:  Herbert Pun and Brayton Mars  Resting Radionuclide: Technetium 86m Sestamibi  Resting Radionuclide Dose: 11.0 mCi   Stress Radionuclide:  Technetium 34m Sestamibi  Stress Radionuclide Dose: 33.0 mCi           Stress Protocol Rest HR: 71 Stress HR: 92  Rest BP: 142/76 Stress BP: 128/62  Exercise Time (min): n/a METS: n/a   Predicted Max HR: 146 bpm % Max HR: 63.01 bpm Rate Pressure Product: 12880   Dose of Adenosine (mg):  n/a Dose of Lexiscan: 0.4 mg  Dose of Atropine (mg): n/a Dose of Dobutamine: n/a mcg/kg/min (at max HR)  Stress Test Technologist: Glade Lloyd, BS-ES  Nuclear Technologist:  Earl Many, CNMT     Rest Procedure:  Myocardial perfusion imaging was performed at rest 45 minutes following the intravenous administration of Technetium 26m Sestamibi. Rest ECG: AV paced.  Stress Procedure:  The patient received IV Lexiscan 0.4 mg over 15-seconds.   Technetium 30m Sestamibi injected at 30-seconds.  Quantitative spect images were obtained after a 45 minute delay.  During the infusion of Lexiscan the patient complained of SOB that resolved in recovery.  Stress ECG: Uninteretable due to baseline ventricular pacing.  QPS Raw Data Images:  Acquisition technically good; normal left ventricular size. Stress Images:  Normal homogeneous uptake in all areas of the myocardium. Rest Images:  Normal homogeneous uptake in all areas of the myocardium. Subtraction (SDS):  No evidence of ischemia. Transient Ischemic Dilatation (Normal <1.22):  1.12 Lung/Heart Ratio (Normal <0.45):  0.24  Quantitative Gated Spect Images QGS EDV:  85 ml QGS ESV:  40 ml  Impression Exercise Capacity:  Lexiscan with no exercise. BP Response:  Normal blood pressure response. Clinical Symptoms:  There is dyspnea. ECG Impression:  Baseline:  Ventricular pacing.  EKG uninterpretable due to ventricular pacing at rest and stress. Comparison with Prior Nuclear Study: Compared to 12/25/11, no significant change.  Overall Impression:  Normal stress nuclear study.  LV Ejection Fraction: 53%.  LV Wall Motion:  NL LV Function; NL Wall Motion  Kirk Ruths

## 2014-04-17 ENCOUNTER — Telehealth: Payer: Self-pay | Admitting: Physician Assistant

## 2014-04-17 ENCOUNTER — Telehealth: Payer: Self-pay | Admitting: *Deleted

## 2014-04-17 NOTE — Telephone Encounter (Signed)
pt notified of echo and myoview results with verbal understanding. pt asked does this mean she is cleared for surgery. I advised I will verify w/Dayna Dunn, PA and cb later today, pt said ok and thank you.

## 2014-04-17 NOTE — Telephone Encounter (Signed)
pt notified that she is cleared for her surgery. Pt said ok and thank you.

## 2014-04-17 NOTE — Progress Notes (Signed)
Yes. Melina Copa PA-C

## 2014-04-17 NOTE — Telephone Encounter (Signed)
Opened in error

## 2014-05-25 ENCOUNTER — Encounter: Payer: Self-pay | Admitting: Internal Medicine

## 2014-05-25 ENCOUNTER — Ambulatory Visit (INDEPENDENT_AMBULATORY_CARE_PROVIDER_SITE_OTHER): Payer: Medicare HMO | Admitting: Internal Medicine

## 2014-05-25 VITALS — BP 130/80 | HR 71 | Ht 64.0 in | Wt 152.6 lb

## 2014-05-25 DIAGNOSIS — R569 Unspecified convulsions: Secondary | ICD-10-CM

## 2014-05-25 DIAGNOSIS — I428 Other cardiomyopathies: Secondary | ICD-10-CM

## 2014-05-25 DIAGNOSIS — I5022 Chronic systolic (congestive) heart failure: Secondary | ICD-10-CM | POA: Diagnosis not present

## 2014-05-25 DIAGNOSIS — Z95810 Presence of automatic (implantable) cardiac defibrillator: Secondary | ICD-10-CM | POA: Diagnosis not present

## 2014-05-25 DIAGNOSIS — Z4502 Encounter for adjustment and management of automatic implantable cardiac defibrillator: Secondary | ICD-10-CM

## 2014-05-25 DIAGNOSIS — I429 Cardiomyopathy, unspecified: Secondary | ICD-10-CM

## 2014-05-25 NOTE — Patient Instructions (Addendum)
Medication Instructions:  Your physician recommends that you continue on your current medications as directed. Please refer to the Current Medication list given to you today.  Labwork: None ordered  Testing/Procedures: None ordered  Follow-Up: You have been referred to Neurology for seizures  Remote monitoring is used to monitor your Pacemaker of ICD from home. This monitoring reduces the number of office visits required to check your device to one time per year. It allows [place] to keep an eye on the functioning of your device to ensure it is working properly. You are scheduled for a device check from home on 08/24/14. You may send your transmission at any time that day. If you have a wireless device, the transmission will be sent automatically. After your physician reviews your transmission, you will receive a postcard with your next transmission date.  Your physician wants you to follow-up in: 1 year with Dr. Caryl Comes.  You will receive a reminder letter in the mail two months in advance. If you don't receive a letter, please call our office to schedule the follow-up appointment.   Any Other Special Instructions Will Be Listed Below (If Applicable).

## 2014-05-25 NOTE — Progress Notes (Signed)
Patient Care Team: [name], MD as PCP - General (Internal Medicine)   HPI  [name] is a 74 y.o. female seen in followup for CRT-D implanted at [facility] for  nonischemic cardiomyopathy.She underwent CRT device generator replacement in Feb 2013 with insertion of new ICD lead 2/2 externalization of the previous RIATA lead  There has been intercurrent but transient normalization of left ventricular systolic function by an echo summer 2012 demonstrating an ejection fraction of 55%-60%. Repeat echo12/12 EF 35%    We do not have these records as the patient was hospitalized at [facility] with subdural hematoma requiring craniotomy complicated by seizures.    AV optimization echo 12/13 done.  Chest x-ray demonstrates high anterior location Ef 30 % with global hypokinesis Myoview>>Normal perfusion;   repeat Myoview 3/16 demonstrated normalization of LV function. Echo confirmed this.  Catheterization done presumably before her ICD implanted at [facility] demonstrated some years ago no obstructive coronary disease   She struggles With fatigue and shortness of breath. She has a little bit of peripheral edema area she denies chest pain.     Past Medical History  Diagnosis Date  . NICM (nonischemic cardiomyopathy)     a. Initial EF of 35%. b. Normal in 07/2009. c. 35% in 2012, 30% in 12/2011. Reported h/o catheterization done presumably before her ICD implanted at [facility] demonstrated some years ago no obstructive coronary disease. Neg nuc 12/2011.  [name] COPD (chronic obstructive pulmonary disease)     a. pt reports "scarring" at the lungs due to double PNA in the past.  . DM2 (diabetes mellitus, type 2)   . HLD (hyperlipidemia)   . Vitamin D deficiency   . Chronic systolic heart failure        . PAF (paroxysmal atrial fibrillation)     a. Not on anticoag due to h/o SDH.  [name] Hypertension   . Subdural hematoma     a. 12/12. Hospitalized at [facility], requiring craniotomy, complicated  by seizures.  . 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  s/p revision 2/2 ERI 02/28/2011 - [facility] CD 3257  . Seizures   . Hypokalemia   . LBBB (left bundle branch block)     Past Surgical History  Procedure Laterality Date  . Cardiac defibrillator placement  2006  . Total abdominal hysterectomy  1983  . Cardiac catheterization      no significant CAD  . Brain hematoma evacuation    . Implantable cardioverter defibrillator (icd) generator change N/A 03/01/2011    Procedure: ICD GENERATOR CHANGE;  Surgeon: Deboraha Sprang, MD;  Location: [facility] CATH LAB;  Service: Cardiovascular;  Laterality: N/A;    Current Outpatient Prescriptions  Medication Sig Dispense Refill  . atorvastatin (LIPITOR) 40 MG tablet Take 40 mg by mouth daily.    [name] BESIVANCE 0.6 % SUSP Place 1 drop into the left eye daily.    . carvedilol (COREG) 25 MG tablet Take 25 mg by mouth 2 (two) times daily with a meal.      . ergocalciferol (VITAMIN D2) 50000 UNITS capsule Take 50,000 Units by mouth 2 (two) times a week. On Monday and Friday    . Fluticasone Furoate-Vilanterol 100-25 MCG/INH AEPB Inhale 1 spray into the lungs daily.    . furosemide (LASIX) 20 MG tablet Take one tablet every other day 90 tablet 3  . HYDROcodone-acetaminophen (NORCO/VICODIN) 5-325 MG per tablet Take 1 tablet by mouth every 8 (eight) hours  as needed for moderate pain.    [name] levETIRAcetam (KEPPRA) 750 MG tablet Take 750 mg by mouth every 12 (twelve) hours.    [name] omeprazole (PRILOSEC) 40 MG capsule Take 40 mg by mouth daily.    . potassium chloride SA (K-DUR,KLOR-CON) 20 MEQ tablet Take 1 tablet (20 mEq total) by mouth daily. 30 tablet 3  . sitaGLIPtan (JANUVIA) 100 MG tablet Take 100 mg by mouth daily.       No current facility-administered medications for this visit.    Allergies  Allergen Reactions  . Codeine Other (See Comments)    "puts me on a trip"  . Gemfibrozil Diarrhea  . Lactose Intolerance (Gi) Diarrhea  .  Morphine And Related   . Valium Hives and Rash    Review of Systems negative except from HPI and PMH  Physical Exam BP 130/80 mmHg  Pulse 71  Ht 5\' 4"  (1.626 m)  Wt 152 lb 9.6 oz (69.219 kg)  BMI 26.18 kg/m2  SpO2 96% Well developed and well nourished in no acute distress HENT traumatic E scleral and icterus clear Neck Supple JVP 6-8 carotids brisk and full Clear to ausculation Regular rate and rhythm,2/6 murmur Soft with active bowel sounds No clubbing cyanosis Trace Edema Alert and oriented,  having involuntary jerks on the left side Skin Warm and Dry  ECG demonstrates P. Synchronous pacing with a narrow QRS (118 ms) and left bundle branch pattern   Potassium 3.6 creatinine 1.46   3/16 Assessment and  Plan  Congestive heart failure-chronic-systolic  Nonischemic cardiomyopathy interval normalization  Fatigue/daytime somnolence  Seizures  CRT D.-St. Jude's anterior location of the LV and a very narrow QRS on ECG  She is euvolemic but still short of breath. At this juncture I was going to in activate ventricular pacing by prolonging the AV delay. However, given normalization of LV function, I will maintain ventricular pacing.  It raises the possibility that her shortness of breath is noncardiac.  [name] She is also having some jerking movements. 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 She has not been followed by neurology. We will schedule a consultation.

## 2014-05-27 LAB — CUP PACEART INCLINIC DEVICE CHECK
Battery Remaining Longevity: 43.2 mo
Brady Statistic RA Percent Paced: 66 %
Brady Statistic RV Percent Paced: 99.72 %
HIGH POWER IMPEDANCE MEASURED VALUE: 50 Ohm
Lead Channel Impedance Value: 362.5 Ohm
Lead Channel Impedance Value: 400 Ohm
Lead Channel Pacing Threshold Amplitude: 1.25 V
Lead Channel Pacing Threshold Pulse Width: 0.5 ms
Lead Channel Pacing Threshold Pulse Width: 0.5 ms
Lead Channel Pacing Threshold Pulse Width: 0.8 ms
Lead Channel Sensing Intrinsic Amplitude: 11.2 mV
Lead Channel Setting Pacing Amplitude: 2 V
Lead Channel Setting Pacing Amplitude: 2.25 V
Lead Channel Setting Pacing Pulse Width: 0.5 ms
Lead Channel Setting Pacing Pulse Width: 0.8 ms
Lead Channel Setting Sensing Sensitivity: 0.5 mV
MDC IDC MSMT LEADCHNL LV IMPEDANCE VALUE: 950 Ohm
MDC IDC MSMT LEADCHNL LV PACING THRESHOLD AMPLITUDE: 1.25 V
MDC IDC MSMT LEADCHNL LV PACING THRESHOLD PULSEWIDTH: 0.8 ms
MDC IDC MSMT LEADCHNL RA PACING THRESHOLD AMPLITUDE: 0.75 V
MDC IDC MSMT LEADCHNL RA SENSING INTR AMPL: 2.6 mV
MDC IDC MSMT LEADCHNL RV PACING THRESHOLD AMPLITUDE: 0.5 V
MDC IDC PG SERIAL: 7025184
MDC IDC SESS DTM: 20160509163210
MDC IDC SET LEADCHNL RA PACING AMPLITUDE: 1.75 V
MDC IDC SET ZONE DETECTION INTERVAL: 355 ms
Pulse Gen Model: 3257
Zone Setting Detection Interval: 280 ms

## 2014-06-22 ENCOUNTER — Other Ambulatory Visit: Payer: Self-pay | Admitting: Internal Medicine

## 2014-07-28 ENCOUNTER — Ambulatory Visit (INDEPENDENT_AMBULATORY_CARE_PROVIDER_SITE_OTHER): Payer: Medicare HMO | Admitting: Neurology

## 2014-07-28 ENCOUNTER — Encounter: Payer: Self-pay | Admitting: Neurology

## 2014-07-28 VITALS — BP 128/70 | HR 75 | Resp 16 | Ht 62.0 in | Wt 153.0 lb

## 2014-07-28 DIAGNOSIS — G40109 Localization-related (focal) (partial) symptomatic epilepsy and epileptic syndromes with simple partial seizures, not intractable, without status epilepticus: Secondary | ICD-10-CM | POA: Diagnosis not present

## 2014-07-28 DIAGNOSIS — Z8679 Personal history of other diseases of the circulatory system: Secondary | ICD-10-CM

## 2014-07-28 HISTORY — DX: Localization-related (focal) (partial) symptomatic epilepsy and epileptic syndromes with simple partial seizures, not intractable, without status epilepticus: G40.109

## 2014-07-28 HISTORY — DX: Personal history of other diseases of the circulatory system: Z86.79
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
## 2014-07-28 NOTE — Patient Instructions (Signed)
1. Continue Keppra 500mg  twice a day 2. As per [place] driving laws, after a seizure, one should not drive until 6 months seizure-free 3. Call our office for any changes  Seizure Precautions: 1. If medication has been prescribed for you to prevent seizures, take it exactly as directed.  Do not stop taking the medicine without talking to your doctor first, even if you have not had a seizure in a long time.   2. Avoid activities in which a seizure would cause danger to yourself or to others.  Don't operate dangerous machinery, swim alone, or climb in high or dangerous places, such as on ladders, roofs, or girders.  Do not drive unless your doctor says you may.  3. If you have any warning that you may have a seizure, lay down in a safe place where you can't hurt yourself.    4.  No driving for 6 months from last seizure, as per [facility].   Please refer to the following link on the [place] website for more information: http://www.epilepsyfoundation.org/answerplace/Social/driving/drivingu.cfm   5.  Maintain good sleep hygiene.  6.  Contact your doctor if you have any problems that may be related to the medicine you are taking.  7.  Call 911 and bring the patient back to the ED if:        A.  The seizure lasts longer than 5 minutes.       B.  The patient doesn't awaken shortly after the seizure  C.  The patient has new problems such as difficulty seeing, speaking or moving  D.  The patient was injured during the seizure  E.  The patient has a temperature over 102 F (39C)  F.  The patient vomited and now is having trouble breathing

## 2014-08-24 ENCOUNTER — Encounter: Payer: Medicare HMO | Admitting: *Deleted

## 2014-08-24 ENCOUNTER — Telehealth: Payer: Self-pay | Admitting: Cardiology

## 2014-08-24 NOTE — Telephone Encounter (Signed)
LMOVM reminding pt to send remote transmission.   

## 2014-08-25 ENCOUNTER — Encounter: Payer: Self-pay | Admitting: Cardiology

## 2014-10-05 ENCOUNTER — Telehealth: Payer: Self-pay | Admitting: Neurology

## 2014-10-05 NOTE — Telephone Encounter (Signed)
If there is no shaking, I wouldn't take another Ativan.  I would increase dose of Keppra back up to 750mg  twice daily.

## 2014-10-05 NOTE — Telephone Encounter (Signed)
I spoke with patient and notified her of advisement. She is agreeable to start back taking the 750 mg bid. She will start the increase today.

## 2014-10-05 NOTE — Telephone Encounter (Signed)
I spoke with patient. She states that about 10:00 this morning she was standing in her kitchen making pancakes and she started having double vision, and then her left leg started going numb and shaking. At that time she did take an Ativan and her vision cleared up but leg was still numb and shaking. She states about 10-15 min later she took another Ativan which stopped the shaking but she is still experiencing the leg numbness. I asked about other sxs which she states there were none. I asked about missed doses of her Keppra which she takes 500 mg bid. She states she missed 1 dose a few nights ago but other than that she hasn't missed any other doses. She wants to know what she should do now if anything, she was questioning if she should take another Ativan. Please advise.

## 2014-10-05 NOTE — Telephone Encounter (Signed)
Pt called to report that she has just had a seizure/ and experiencing lt leg numbness/ and having double vision//call back @ [phone]

## 2014-12-04 ENCOUNTER — Telehealth: Payer: Self-pay | Admitting: Cardiology

## 2014-12-04 NOTE — Telephone Encounter (Signed)
Spoke w/ pt and informed her of merlin recall. Instructed pt to send manual transmission with home monitor. Informed pt to be aware of vibratory alerts. Pt verbalized understanding and said she would send transmission later today.

## 2014-12-07 ENCOUNTER — Telehealth: Payer: Self-pay | Admitting: Cardiology

## 2014-12-07 NOTE — Telephone Encounter (Signed)
Pt called to make sure we got her manual transmission from the weekend. Pt is has a ICD that has a battery recall. Informed pt that we did receive the transmission and that her battery life is still 3.1 years. Pt verbalized understanding.

## 2015-01-21 ENCOUNTER — Telehealth: Payer: Self-pay | Admitting: Cardiology

## 2015-01-21 ENCOUNTER — Telehealth: Payer: Self-pay | Admitting: Internal Medicine

## 2015-01-21 NOTE — Telephone Encounter (Signed)
Spoke w/ pt and informed her that her remote transmission was not received. Instructed pt to call tech services so she could receive help troubleshooting home monitor. Pt verbalized understanding.

## 2015-01-21 NOTE — Telephone Encounter (Signed)
Spoke w/ pt and requested that she send remote transmission b/c her home monitor has not updated in at least 8 days.

## 2015-01-21 NOTE — Telephone Encounter (Signed)
°  1. Has your device fired? no ° °2. Is you device beeping? no ° °3. Are you experiencing draining or swelling at device site? no ° °4. Are you calling to see if we received your device transmission? yes ° °5. Have you passed out? no ° °

## 2015-02-02 ENCOUNTER — Ambulatory Visit (INDEPENDENT_AMBULATORY_CARE_PROVIDER_SITE_OTHER): Payer: Medicare HMO | Admitting: Neurology

## 2015-02-02 ENCOUNTER — Encounter: Payer: Self-pay | Admitting: Neurology

## 2015-02-02 VITALS — BP 118/74 | HR 74 | Ht 62.0 in | Wt 153.0 lb

## 2015-02-02 DIAGNOSIS — Z8679 Personal history of other diseases of the circulatory system: Secondary | ICD-10-CM | POA: Diagnosis not present

## 2015-02-02 DIAGNOSIS — G40109 Localization-related (focal) (partial) symptomatic epilepsy and epileptic syndromes with simple partial seizures, not intractable, without status epilepticus: Secondary | ICD-10-CM | POA: Diagnosis not present

## 2015-02-02 MED ORDER — LEVETIRACETAM 1000 MG PO TABS: 1000.0000 mg | ORAL_TABLET | Freq: Two times a day (BID) | ORAL | Status: DC

## 2015-02-02 NOTE — Patient Instructions (Signed)
1. Increase Keppra: start taking Keppra 1000mg  twice a day 2. Continue to keep a calendar of your seizures 3. Follow-up in 3 months  Seizure Precautions: 1. If medication has been prescribed for you to prevent seizures, take it exactly as directed.  Do not stop taking the medicine without talking to your doctor first, even if you have not had a seizure in a long time.   2. Avoid activities in which a seizure would cause danger to yourself or to others.  Don't operate dangerous machinery, swim alone, or climb in high or dangerous places, such as on ladders, roofs, or girders.  Do not drive unless your doctor says you may.  3. If you have any warning that you may have a seizure, lay down in a safe place where you can't hurt yourself.    4.  No driving for 6 months from last seizure, as per [facility].   Please refer to the following link on the [place] website for more information: http://www.epilepsyfoundation.org/answerplace/Social/driving/drivingu.cfm   5.  Maintain good sleep hygiene. Avoid alcohol  6.  Contact your doctor if you have any problems that may be related to the medicine you are taking.  7.  Call 911 and bring the patient back to the ED if:        A.  The seizure lasts longer than 5 minutes.       B.  The patient doesn't awaken shortly after the seizure  C.  The patient has new problems such as difficulty seeing, speaking or moving  D.  The patient was injured during the seizure  E.  The patient has a temperature over 102 F (39C)  F.  The patient vomited and now is having trouble breathing

## 2015-02-02 NOTE — Progress Notes (Signed)
NEUROLOGY FOLLOW UP OFFICE NOTE  Zakaya Cubillas HN:[id number]  HISTORY OF PRESENT ILLNESS: I had the pleasure of seeing Carianna Torok in follow-up in the neurology clinic on 02/02/2015.  The patient was last seen 6 months ago for seizures. She has a history of craniotomy for right epidural hematoma. She called our office on 10/05/14 to report a seizure where she started having double vision, followed by left leg numbness and shaking. She took Ativan x 2. Keppra dose was increased to 750mg  BID. She states that she was given samples of Aptiom by her PCP, only took 3 doses, and stopped it due to nightmares and expense. She reports having 2 more episodes of twitching on 9/26 and 10/17, twitching was "all over, my arms, legs, whole body," then reports having seizures on 01/13/15 and 01/29/15 but states these were different, no twitching, she just had numbness all throughout her body starting in her feet, going up her legs, feeling like her balance was off. She took 2mg  Ativan for each episode. She denied losing consciousness but did feel confused. She denies any side effects on Keppra 750mg  BID.  She reports frequent headaches occurring around 2-3 times a week, "not severe," with frontal throbbing lasting a few hours, no associated nausea/vomiting/photo/phonophobia. She does not take any rescue medications. She has occasional dizziness when bending down. She denies any falls. She lives with her husband and children, and does not drive.  HPI: This is a 75 yo RH woman with a history of cardiomyopathy s/p ICD placement, hypertension, hyperlipidemia, diabetes, paroxysmal atrial fibrillation, who had a fall in December 2012, found to have multiple right extraaxial intracranial hemorrhages and INR >20 while on Coumadin at that time. She had a witnessed seizure in [place] then transferred to [facility] where she had a right craniotomy of evacuation of epidural hematoma. She had another seizure after the  surgery. She was discharged home on Keppra. She had an admission in May 2013 for recurrent seizures, there is note that she was being weaned off seizure medication, then had a seizure and Keppra was restarted. She was reported to have twitching. I personally reviewed head CT done at that time which showed right frontal craniotomy, no residual fluid collection. Remote cortical infarcts/contusions in the right and left occipital regions. She denies any further seizures until 05/24/14 when she started having uncontrollable twitching on the left side of her face and left arm per ER notes. She tells me today that her whole body was shaking. She was awake and alert, able to communicate, denies confusion. She denied focal weakness after. She was given IV Ativan with resolution of twitching. She had a head CT (images unavailable for review) which showed post-surgical right frontal craniotomy changes, chronic right PCA infarct, no acute abnormalities. She had an EEG reported as normal awake and drowsy EEG. She saw her PCP and had an elevated Keppra level on Keppra 750mg  BID, dose was reduced to 500mg  BID. She denies any further episodes of left-sided twitching. She has a prescription for prn Ativan and has not needed it.   Epilepsy Risk Factors: Right-sided intracranial hemorrhage in 2012 s/p craniotomy. Otherwise she had a normal birth and early development. There is no history of febrile convulsions, CNS infections such as meningitis/encephalitis, or family history of seizures.  PAST MEDICAL HISTORY: Past Medical History  Diagnosis Date  . NICM (nonischemic cardiomyopathy) ([place])     a. Initial EF of 35%. b. Normal in 07/2009. c. 35% in 2012, 30% in  12/2011. Reported h/o catheterization done presumably before her ICD implanted at [facility] demonstrated some years ago no obstructive coronary disease. Neg nuc 12/2011.  [name] COPD (chronic obstructive pulmonary disease) ([place])     a. pt reports "scarring" at the lungs due  to double PNA in the past.  . DM2 (diabetes mellitus, type 2) ([place])   . HLD (hyperlipidemia)   . Vitamin D deficiency   . Chronic systolic heart failure ([place])        . PAF (paroxysmal atrial fibrillation) ([place])     a. Not on anticoag due to h/o SDH.  [name] Hypertension   . Subdural hematoma ([place])     a. 12/12. Hospitalized at [facility], requiring craniotomy, complicated by seizures.  . 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  s/p revision 2/2 ERI 02/28/2011 - [facility] CD 3257  . Seizures ([place])   . Hypokalemia   . LBBB (left bundle branch block)     MEDICATIONS: Current Outpatient Prescriptions on File Prior to Visit  Medication Sig Dispense Refill  . atorvastatin (LIPITOR) 40 MG tablet Take 40 mg by mouth daily.    . carvedilol (COREG) 25 MG tablet Take 25 mg by mouth 2 (two) times daily with a meal.      . Fluticasone Furoate-Vilanterol 100-25 MCG/INH AEPB Inhale 1 spray into the lungs daily.    . furosemide (LASIX) 20 MG tablet TAKE ONE TABLET BY MOUTH EVERY OTHER DAY WITH POTASSIUM 30 tablet 11  . omeprazole (PRILOSEC) 40 MG capsule Take 40 mg by mouth 2 (two) times daily.     . potassium chloride (K-DUR) 10 MEQ tablet Take 2 tablets twice a day    . sitaGLIPtan (JANUVIA) 100 MG tablet Take 100 mg by mouth daily.       No current facility-administered medications on file prior to visit.    ALLERGIES: Allergies  Allergen Reactions  . Codeine Other (See Comments)    "puts me on a trip"  . Gemfibrozil Diarrhea  . Lactose Intolerance (Gi) Diarrhea  . Morphine And Related   . Valium Hives and Rash    FAMILY HISTORY: Family History  Problem Relation Age of Onset  . Heart disease    . Heart disease Mother   . Heart disease Father     SOCIAL HISTORY: Social History   Social History  . Marital Status: Married    Spouse Name: N/A  . Number of Children: 5  . Years of Education: N/A   Occupational History  . retired    Social History Main Topics   . Smoking status: Former Smoker    Types: Cigarettes    Quit date: 01/17/1968  . Smokeless tobacco: Never Used  . Alcohol Use: No  . Drug Use: No  . Sexual Activity: Not on file   Other Topics Concern  . Not on file   Social History Narrative   Registration notes - ICD = St. Jude    REVIEW OF SYSTEMS: Constitutional: No fevers, chills, or sweats, no generalized fatigue, change in appetite Eyes: No visual changes, double vision, eye pain Ear, nose and throat: No hearing loss, ear pain, nasal congestion, sore throat Cardiovascular: No chest pain, palpitations Respiratory:  No shortness of breath at rest or with exertion, wheezes GastrointestinaI: No nausea, vomiting, diarrhea, abdominal pain, fecal incontinence Genitourinary:  No dysuria, urinary retention or frequency Musculoskeletal:  No neck pain,+ back pain Integumentary: No rash, pruritus, skin lesions Neurological: as above Psychiatric: No depression,  insomnia, anxiety Endocrine: No palpitations, fatigue, diaphoresis, mood swings, change in appetite, change in weight, increased thirst Hematologic/Lymphatic:  No anemia, purpura, petechiae. Allergic/Immunologic: no itchy/runny eyes, nasal congestion, recent allergic reactions, rashes  PHYSICAL EXAM: Filed Vitals:   02/02/15 0817  BP: 118/74  Pulse: 74   General: No acute distress Head:  Normocephalic/atraumatic Neck: supple, no paraspinal tenderness, full range of motion Heart:  Regular rate and rhythm Lungs:  Clear to auscultation bilaterally Back: No paraspinal tenderness Skin/Extremities: No rash, no edema Neurological Exam: alert and oriented to person, place, and time. No aphasia or dysarthria. Fund of knowledge is appropriate.  Recent and remote memory are intact.  Attention and concentration are normal.    Able to name objects and repeat phrases. Cranial nerves: Pupils equal, round, reactive to light.  Extraocular movements intact with no nystagmus. Visual  fields full. Facial sensation intact. No facial asymmetry. Tongue, uvula, palate midline.  Motor: Bulk and tone normal, muscle strength 5/5 throughout with no pronator drift.  Sensation to light touch intact.  No extinction to double simultaneous stimulation.  Deep tendon reflexes 2+ throughout, toes downgoing.  Finger to nose testing intact.  Gait narrow-based and steady, mild difficulty with tandem walk but able. Romberg negative.  IMPRESSION: This is a 75 yo RH woman with a history of hypertension, hyperlipidemia, diabetes, cardiomyopathy s/p ICD placement, paroxysmal atrial fibrillation off anticoagulation due to intracranial bleed in 2012 with subsequent seizures. She had been seizure-free for 3 years until she had a breakthrough simple partial seizure with left-sided twitching last 05/24/14. No clear triggers, she denies missing medication. Her Keppra dose was reduced after the seizure to 500mg  BID due to elevated keppra level. EEG normal, head CT no acute changes. She reports having 5 seizures in the last 6 months, however the last 2 seizures had a different semiology. She will increase Keppra to 1000mg  BID. She may take prn Ativan, but knows to only take this as needed. If seizures continue despite higher dose Keppra, another AED such as Lamictal or Zonegran will be added on. She will keep a calendar of her seizures and follow-up in 3 months. She does not drive, she is aware of  driving laws to stop driving after a seizure, until 6 months seizure-free.   Thank you for allowing me to participate in her care.  Please do not hesitate to call for any questions or concerns.  The duration of this appointment visit was 25 minutes of face-to-face time with the patient.  Greater than 50% of this time was spent in counseling, explanation of diagnosis, planning of further management, and coordination of care.   Ellouise Newer, M.D.   CC: Dr. Jannette Fogo

## 2015-02-11 ENCOUNTER — Telehealth: Payer: Self-pay | Admitting: Cardiology

## 2015-02-11 NOTE — Telephone Encounter (Signed)
LMOVM for pt to send remote transmission b/c her home monitor has not updated in at least 8 days.

## 2015-03-05 ENCOUNTER — Other Ambulatory Visit: Payer: Self-pay | Admitting: Family Medicine

## 2015-03-05 DIAGNOSIS — G40109 Localization-related (focal) (partial) symptomatic epilepsy and epileptic syndromes with simple partial seizures, not intractable, without status epilepticus: Secondary | ICD-10-CM

## 2015-03-05 MED ORDER — LEVETIRACETAM 1000 MG PO TABS: 1000.0000 mg | ORAL_TABLET | Freq: Two times a day (BID) | ORAL | Status: DC

## 2015-03-05 NOTE — Telephone Encounter (Signed)
Received 90 day refill request from [facility] for [place].

## 2015-04-02 ENCOUNTER — Telehealth: Payer: Self-pay | Admitting: Cardiology

## 2015-04-02 NOTE — Telephone Encounter (Signed)
Spoke w/ pt and requested that she send a manual transmission w/ her home monitor b/c her monitor has not updated in at least 8 days. Pt verbalized understanding and stated that she will once she recovers from the flu.

## 2015-04-05 ENCOUNTER — Ambulatory Visit (INDEPENDENT_AMBULATORY_CARE_PROVIDER_SITE_OTHER): Payer: Medicare HMO | Admitting: *Deleted

## 2015-04-05 DIAGNOSIS — I429 Cardiomyopathy, unspecified: Secondary | ICD-10-CM | POA: Diagnosis not present

## 2015-04-05 DIAGNOSIS — I5022 Chronic systolic (congestive) heart failure: Secondary | ICD-10-CM

## 2015-04-05 DIAGNOSIS — Z95810 Presence of automatic (implantable) cardiac defibrillator: Secondary | ICD-10-CM | POA: Diagnosis not present

## 2015-04-05 DIAGNOSIS — I428 Other cardiomyopathies: Secondary | ICD-10-CM

## 2015-04-06 NOTE — Progress Notes (Signed)
Remote ICD transmission.   

## 2015-05-07 ENCOUNTER — Encounter: Payer: Self-pay | Admitting: Cardiology

## 2015-05-07 LAB — CUP PACEART REMOTE DEVICE CHECK
HighPow Impedance: 51 Ohm
Implantable Lead Location: 753860
Implantable Lead Model: 1580
Lead Channel Impedance Value: 1100 Ohm
Lead Channel Pacing Threshold Amplitude: 0.75 V
Lead Channel Sensing Intrinsic Amplitude: 10.4 mV
Lead Channel Setting Pacing Amplitude: 1.75 V
Lead Channel Setting Pacing Amplitude: 2 V
Lead Channel Setting Pacing Amplitude: 2.25 V
Lead Channel Setting Pacing Pulse Width: 0.5 ms
Lead Channel Setting Pacing Pulse Width: 0.8 ms
MDC IDC LEAD IMPLANT DT: 20060925
MDC IDC LEAD IMPLANT DT: 20060925
MDC IDC LEAD IMPLANT DT: 20060925
MDC IDC LEAD LOCATION: 753858
MDC IDC LEAD LOCATION: 753859
MDC IDC MSMT LEADCHNL RA IMPEDANCE VALUE: 400 Ohm
MDC IDC MSMT LEADCHNL RA PACING THRESHOLD AMPLITUDE: 0.75 V
MDC IDC MSMT LEADCHNL RA PACING THRESHOLD PULSEWIDTH: 0.5 ms
MDC IDC MSMT LEADCHNL RA SENSING INTR AMPL: 2.2 mV
MDC IDC MSMT LEADCHNL RV IMPEDANCE VALUE: 390 Ohm
MDC IDC MSMT LEADCHNL RV PACING THRESHOLD PULSEWIDTH: 0.5 ms
MDC IDC PG SERIAL: 7025184
MDC IDC SESS DTM: 20170421145153
MDC IDC SET LEADCHNL RV SENSING SENSITIVITY: 0.5 mV
MDC IDC STAT BRADY RA PERCENT PACED: 64 %
MDC IDC STAT BRADY RV PERCENT PACED: 99 %

## 2015-05-17 ENCOUNTER — Ambulatory Visit: Payer: Medicare HMO | Admitting: Neurology

## 2015-07-19 ENCOUNTER — Ambulatory Visit (INDEPENDENT_AMBULATORY_CARE_PROVIDER_SITE_OTHER): Payer: Medicare HMO | Admitting: Internal Medicine

## 2015-07-19 ENCOUNTER — Encounter: Payer: Self-pay | Admitting: Internal Medicine

## 2015-07-19 ENCOUNTER — Telehealth: Payer: Self-pay | Admitting: Cardiology

## 2015-07-19 VITALS — BP 142/76 | HR 70 | Ht 64.0 in | Wt 154.0 lb

## 2015-07-19 DIAGNOSIS — I5022 Chronic systolic (congestive) heart failure: Secondary | ICD-10-CM | POA: Diagnosis not present

## 2015-07-19 DIAGNOSIS — I429 Cardiomyopathy, unspecified: Secondary | ICD-10-CM | POA: Diagnosis not present

## 2015-07-19 DIAGNOSIS — Z95810 Presence of automatic (implantable) cardiac defibrillator: Secondary | ICD-10-CM

## 2015-07-19 DIAGNOSIS — I428 Other cardiomyopathies: Secondary | ICD-10-CM

## 2015-07-19 DIAGNOSIS — I480 Paroxysmal atrial fibrillation: Secondary | ICD-10-CM

## 2015-07-19 LAB — CBC WITH DIFFERENTIAL/PLATELET
BASOS ABS: 43 {cells}/uL (ref 0–200)
Basophils Relative: 1 %
EOS ABS: 86 {cells}/uL (ref 15–500)
Eosinophils Relative: 2 %
HEMATOCRIT: 39.9 % (ref 35.0–45.0)
Hemoglobin: 13.4 g/dL (ref 11.7–15.5)
LYMPHS PCT: 34 %
Lymphs Abs: 1462 cells/uL (ref 850–3900)
MCH: 29 pg (ref 27.0–33.0)
MCHC: 33.6 g/dL (ref 32.0–36.0)
MCV: 86.4 fL (ref 80.0–100.0)
MONO ABS: 344 {cells}/uL (ref 200–950)
MONOS PCT: 8 %
NEUTROS PCT: 55 %
Neutro Abs: 2365 cells/uL (ref 1500–7800)
PLATELETS: 111 10*3/uL — AB (ref 140–400)
RBC: 4.62 MIL/uL (ref 3.80–5.10)
RDW: 14 % (ref 11.0–15.0)
WBC: 4.3 10*3/uL (ref 3.8–10.8)

## 2015-07-19 LAB — CUP PACEART INCLINIC DEVICE CHECK
Battery Remaining Longevity: 31.2
Brady Statistic RA Percent Paced: 63 %
Date Time Interrogation Session: 20170703124435
HIGH POWER IMPEDANCE MEASURED VALUE: 48.7188
Implantable Lead Implant Date: 20060925
Implantable Lead Implant Date: 20060925
Implantable Lead Model: 1580
Lead Channel Impedance Value: 475 Ohm
Lead Channel Impedance Value: 900 Ohm
Lead Channel Pacing Threshold Amplitude: 0.75 V
Lead Channel Pacing Threshold Amplitude: 1.25 V
Lead Channel Pacing Threshold Pulse Width: 0.8 ms
Lead Channel Sensing Intrinsic Amplitude: 2.5 mV
Lead Channel Sensing Intrinsic Amplitude: 9.5 mV
Lead Channel Setting Pacing Amplitude: 1.75 V
Lead Channel Setting Sensing Sensitivity: 0.5 mV
MDC IDC LEAD IMPLANT DT: 20060925
MDC IDC LEAD LOCATION: 753858
MDC IDC LEAD LOCATION: 753859
MDC IDC LEAD LOCATION: 753860
MDC IDC MSMT LEADCHNL LV PACING THRESHOLD AMPLITUDE: 1.25 V
MDC IDC MSMT LEADCHNL LV PACING THRESHOLD PULSEWIDTH: 0.8 ms
MDC IDC MSMT LEADCHNL RA IMPEDANCE VALUE: 412.5 Ohm
MDC IDC MSMT LEADCHNL RA PACING THRESHOLD PULSEWIDTH: 0.5 ms
MDC IDC MSMT LEADCHNL RV PACING THRESHOLD AMPLITUDE: 0.75 V
MDC IDC MSMT LEADCHNL RV PACING THRESHOLD PULSEWIDTH: 0.5 ms
MDC IDC SET LEADCHNL LV PACING AMPLITUDE: 2.25 V
MDC IDC SET LEADCHNL LV PACING PULSEWIDTH: 0.8 ms
MDC IDC SET LEADCHNL RV PACING AMPLITUDE: 2 V
MDC IDC SET LEADCHNL RV PACING PULSEWIDTH: 0.5 ms
MDC IDC STAT BRADY RV PERCENT PACED: 99.13 %
Pulse Gen Serial Number: 7025184

## 2015-07-19 LAB — BASIC METABOLIC PANEL
BUN: 15 mg/dL (ref 7–25)
CO2: 27 mmol/L (ref 20–31)
Calcium: 8.9 mg/dL (ref 8.6–10.4)
Chloride: 104 mmol/L (ref 98–110)
Creat: 1.05 mg/dL — ABNORMAL HIGH (ref 0.60–0.93)
GLUCOSE: 95 mg/dL (ref 65–99)
POTASSIUM: 4 mmol/L (ref 3.5–5.3)
SODIUM: 143 mmol/L (ref 135–146)

## 2015-07-19 LAB — BRAIN NATRIURETIC PEPTIDE: Brain Natriuretic Peptide: 90.3 pg/mL

## 2015-07-19 NOTE — Telephone Encounter (Signed)
Spoke w/ pt and requested that she send a manual transmission b/c her home monitor has not updated in at least 14 days.   

## 2015-07-19 NOTE — Progress Notes (Signed)
Patient Care Team: [name], MD as PCP - General (Internal Medicine)   HPI  [name] is a 75 y.o. female seen in followup for CRT-D implanted at [facility] for  nonischemic cardiomyopathy.She underwent CRT device generator replacement in Feb 2013 with insertion of new ICD lead 2/2 externalization of the previous RIATA lead  There has been intercurrent but transient normalization of left ventricular systolic function by an echo summer 2012 demonstrating an ejection fraction of 55%-60%. Repeat echo12/12 EF 35%  We do not have these records as the patient was hospitalized at [facility] with subdural hematoma requiring craniotomy complicated by seizures.   AV optimization echo 12/13 done.  Chest x-ray demonstrates high anterior location Ef 30 % with global hypokinesis Myoview>>Normal perfusion;  Repeat Myoview 3/16 demonstrated normalization of LV function. Echo confirmed this.  Catheterization done presumably before her ICD implanted at [facility] demonstrated some years ago no obstructive coronary disease   She struggles With fatigue and shortness of breath. She is short of breath back and down her driveway probably [id number] feet. This is gradually worsening.  She has a little bit of peripheral edema area she denies chest pain. She has PND and orthopnea    Past Medical History  Diagnosis Date  . NICM (nonischemic cardiomyopathy) ([place])     a. Initial EF of 35%. b. Normal in 07/2009. c. 35% in 2012, 30% in 12/2011. Reported h/o catheterization done presumably before her ICD implanted at [facility] demonstrated some years ago no obstructive coronary disease. Neg nuc 12/2011.  [name] COPD (chronic obstructive pulmonary disease) ([place])     a. pt reports "scarring" at the lungs due to double PNA in the past.  . DM2 (diabetes mellitus, type 2) ([place])   . HLD (hyperlipidemia)   . Vitamin D deficiency   . Chronic systolic heart failure ([place])        . PAF (paroxysmal atrial fibrillation) ([place])    a. Not on anticoag due to h/o SDH.  [name] Hypertension   . Subdural hematoma ([place])     a. 12/12. Hospitalized at [facility], requiring craniotomy, complicated by seizures.  . 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  s/p revision 2/2 ERI 02/28/2011 - [facility] CD 3257  . Seizures ([place])   . Hypokalemia   . LBBB (left bundle branch block)     Past Surgical History  Procedure Laterality Date  . Cardiac defibrillator placement  2006  . Total abdominal hysterectomy  1983  . Cardiac catheterization      no significant CAD  . Brain hematoma evacuation    . Implantable cardioverter defibrillator (icd) generator change N/A 03/01/2011    Procedure: ICD GENERATOR CHANGE;  Surgeon: Deboraha Sprang, MD;  Location: [facility] CATH LAB;  Service: Cardiovascular;  Laterality: N/A;  . Cholecystectomy      Current Outpatient Prescriptions  Medication Sig Dispense Refill  . atorvastatin (LIPITOR) 40 MG tablet Take 40 mg by mouth daily.    . carvedilol (COREG) 25 MG tablet Take 25 mg by mouth 2 (two) times daily with a meal.      . furosemide (LASIX) 20 MG tablet TAKE ONE TABLET BY MOUTH EVERY OTHER DAY WITH POTASSIUM 30 tablet 11  . levETIRAcetam (KEPPRA) 1000 MG tablet Take 1 tablet (1,000 mg total) by mouth 2 (two) times daily. 180 tablet 3  . LORazepam (ATIVAN) 1 MG tablet Take 1 mg by mouth. Take 1 tablet by mouth as needed  for seizures. Patient states she takes 2 tablets when needed.    [name] omeprazole (PRILOSEC) 40 MG capsule Take 40 mg by mouth 2 (two) times daily.     . potassium chloride (K-DUR) 10 MEQ tablet Take 2 tablets by mouth twice daily    . sitaGLIPtan (JANUVIA) 100 MG tablet Take 100 mg by mouth daily.       No current facility-administered medications for this visit.    Allergies  Allergen Reactions  . Codeine Other (See Comments)    "puts me on a trip"  . Gemfibrozil Diarrhea  . Lactose Intolerance (Gi) Diarrhea  . Morphine And Related   . Valium Hives and Rash     Review of Systems negative except from HPI and PMH  Physical Exam BP 142/76 mmHg  Pulse 70  Ht 5\' 4"  (1.626 m)  Wt 154 lb (69.854 kg)  BMI 26.42 kg/m2  SpO2 97% Well developed and well nourished in no acute distress HENT traumatic E scleral and icterus clear Neck Supple JVP 6-8 carotids brisk and full Clear to ausculation Regular rate and rhythm,2/6 murmur Soft with active bowel sounds No clubbing cyanosis Trace Edema Alert and oriented,  having involuntary jerks on the left side Skin Warm and Dry  ECG demonstrates P. Synchronous pacing with a narrow QRS (160 ms) and left bundle branch pattern  ECG 2011 at a QRS duration 150 ms  With intrinsic conduction QRS durtation is 135 msec  Potassium 3.6 creatinine 1.46   3/16 Assessment and  Plan  Congestive heart failure-chronic-systolic  Nonischemic cardiomyopathy interval normalization  Fatigue/daytime somnolence  Seizures  Renal insufficiency  Gr 3  CRT D.-St. Jude's anterior location of the LV and a very narrow QRS on ECG    We will turn off her RV pacing lead with narrower QRS  She has some volume overload will ncrase the diuretic gently wiil have her return in about 4 weeks  To reassess.  May need repeat echo

## 2015-07-19 NOTE — Patient Instructions (Signed)
Medication Instructions: - Your physician has recommended you make the following change in your medication:  1) Start lasix 20 mg two tablets (40 mg) every other day alternating with one tablet (20 mg) every other day x 10 days   Labwork: - Your physician recommends that you have lab work today: BNP/ BMP/ CBC  Procedures/Testing: - none  Follow-Up: - Your physician recommends that you schedule a follow-up appointment in: 4 weeks with Tommye Standard, PA for Dr. Caryl Comes.  Any Additional Special Instructions Will Be Listed Below (If Applicable).     If you need a refill on your cardiac medications before your next appointment, please call your pharmacy.

## 2015-07-21 ENCOUNTER — Other Ambulatory Visit: Payer: Self-pay | Admitting: *Deleted

## 2015-08-10 ENCOUNTER — Encounter: Payer: Self-pay | Admitting: Neurology

## 2015-08-10 ENCOUNTER — Ambulatory Visit (INDEPENDENT_AMBULATORY_CARE_PROVIDER_SITE_OTHER): Payer: Medicare HMO | Admitting: Neurology

## 2015-08-10 VITALS — BP 118/78 | HR 77 | Ht 64.0 in | Wt 147.4 lb

## 2015-08-10 DIAGNOSIS — Z8679 Personal history of other diseases of the circulatory system: Secondary | ICD-10-CM | POA: Diagnosis not present

## 2015-08-10 DIAGNOSIS — G40109 Localization-related (focal) (partial) symptomatic epilepsy and epileptic syndromes with simple partial seizures, not intractable, without status epilepticus: Secondary | ICD-10-CM

## 2015-08-10 MED ORDER — LEVETIRACETAM 1000 MG PO TABS: ORAL_TABLET | ORAL | 3 refills | Status: DC

## 2015-08-10 NOTE — Progress Notes (Signed)
NEUROLOGY FOLLOW UP OFFICE NOTE  [name] [id number]  HISTORY OF PRESENT ILLNESS: I had the pleasure of seeing [name] in follow-up in the neurology clinic on 08/10/2015.  The patient was last seen 6 months ago for seizures. She has a history of craniotomy for right epidural hematoma. Since her last visit, she reports having a bigger seizure in May when her eyes got blurred, then her left foot got numb and traveled up the left side her her body. She felt her hands would not do what she wanted them to do, she could talk, no confusion. She had difficulty walking but got back to the house then had trouble getting into her car. It took a couple of days to recover. She took 3 doses of Ativan that time. She had 2 smaller ones a week or so later, starting the same way but not lasting as long. She was overtired at that time. She reports having minor ones between January to May, none in June or July. She is tolerating Keppra 1000mg  BID without any side effects. She denies any further frequent headaches. Sometimes her insides feel jittery. She denies any falls, states she has caught herself a few times. She denies any dizziness, vision changes.   HPI: This is a 75 yo RH woman with a history of cardiomyopathy s/p ICD placement, hypertension, hyperlipidemia, diabetes, paroxysmal atrial fibrillation, who had a fall in December 2012, found to have multiple right extraaxial intracranial hemorrhages and INR >20 while on Coumadin at that time. She had a witnessed seizure in [place] then transferred to [facility] where she had a right craniotomy of evacuation of epidural hematoma. She had another seizure after the surgery. She was discharged home on Keppra. She had an admission in May 2013 for recurrent seizures, there is note that she was being weaned off seizure medication, then had a seizure and Keppra was restarted. She was reported to have twitching. I personally reviewed head CT done at that  time which showed right frontal craniotomy, no residual fluid collection. Remote cortical infarcts/contusions in the right and left occipital regions. She denies any further seizures until 05/24/14 when she started having uncontrollable twitching on the left side of her face and left arm per ER notes. She tells me today that her whole body was shaking. She was awake and alert, able to communicate, denies confusion. She denied focal weakness after. She was given IV Ativan with resolution of twitching. She had a head CT (images unavailable for review) which showed post-surgical right frontal craniotomy changes, chronic right PCA infarct, no acute abnormalities. She had an EEG reported as normal awake and drowsy EEG. She saw her PCP and had an elevated Keppra level on Keppra 750mg  BID, dose was reduced to 500mg  BID. She denies any further episodes of left-sided twitching. She has a prescription for prn Ativan.   Epilepsy Risk Factors: Right-sided intracranial hemorrhage in 2012 s/p craniotomy. Otherwise she had a normal birth and early development. There is no history of febrile convulsions, CNS infections such as meningitis/encephalitis, or family history of seizures.  PAST MEDICAL HISTORY: Past Medical History:  Diagnosis Date  . 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  s/p revision 2/2 ERI 02/28/2011 - [facility] CD 3257  . Chronic systolic heart failure ([place])       . COPD (chronic obstructive pulmonary disease) ([place])    a. pt reports "scarring" at the lungs due to double PNA in the past.  .  DM2 (diabetes mellitus, type 2) ([place])   . HLD (hyperlipidemia)   . Hypertension   . Hypokalemia   . LBBB (left bundle branch block)   . NICM (nonischemic cardiomyopathy) ([place])    a. Initial EF of 35%. b. Normal in 07/2009. c. 35% in 2012, 30% in 12/2011. Reported h/o catheterization done presumably before her ICD implanted at [facility] demonstrated some years ago no obstructive coronary  disease. Neg nuc 12/2011.  [name] PAF (paroxysmal atrial fibrillation) ([place])    a. Not on anticoag due to h/o SDH.  . Seizures ([place])   . Subdural hematoma ([place])    a. 12/12. Hospitalized at [facility], requiring craniotomy, complicated by seizures.  . Vitamin D deficiency     MEDICATIONS: Current Outpatient Prescriptions on File Prior to Visit  Medication Sig Dispense Refill  . atorvastatin (LIPITOR) 40 MG tablet Take 40 mg by mouth daily.    . carvedilol (COREG) 25 MG tablet Take 25 mg by mouth 2 (two) times daily with a meal.      . furosemide (LASIX) 20 MG tablet TAKE ONE TABLET BY MOUTH EVERY OTHER DAY WITH POTASSIUM 30 tablet 11  . levETIRAcetam (KEPPRA) 1000 MG tablet Take 1 tablet (1,000 mg total) by mouth 2 (two) times daily. 180 tablet 3  . LORazepam (ATIVAN) 1 MG tablet Take 1 mg by mouth. Take 1 tablet by mouth as needed for seizures. Patient states she takes 2 tablets when needed.    [name] omeprazole (PRILOSEC) 40 MG capsule Take 40 mg by mouth 2 (two) times daily.     . potassium chloride (K-DUR) 10 MEQ tablet Take 2 tablets by mouth twice daily    . sitaGLIPtan (JANUVIA) 100 MG tablet Take 100 mg by mouth daily.       No current facility-administered medications on file prior to visit.     ALLERGIES: Allergies  Allergen Reactions  . Codeine Other (See Comments)    "puts me on a trip"  . Gemfibrozil Diarrhea  . Lactose Intolerance (Gi) Diarrhea  . Morphine And Related   . Valium Hives and Rash    FAMILY HISTORY: Family History  Problem Relation Age of Onset  . Heart disease    . Heart disease Mother   . Heart disease Father     SOCIAL HISTORY: Social History   Social History  . Marital status: Married    Spouse name: N/A  . Number of children: 5  . Years of education: N/A   Occupational History  . retired    Social History Main Topics  . Smoking status: Former Smoker    Types: Cigarettes    Quit date: 01/17/1968  . Smokeless tobacco: Never Used  . Alcohol  use No  . Drug use: No  . Sexual activity: Not on file   Other Topics Concern  . Not on file   Social History Narrative   Registration notes - ICD = [employer]    REVIEW OF SYSTEMS: Constitutional: No fevers, chills, or sweats, no generalized fatigue, change in appetite Eyes: No visual changes, double vision, eye pain Ear, nose and throat: No hearing loss, ear pain, nasal congestion, sore throat Cardiovascular: No chest pain, palpitations Respiratory:  No shortness of breath at rest or with exertion, wheezes GastrointestinaI: No nausea, vomiting, diarrhea, abdominal pain, fecal incontinence Genitourinary:  No dysuria, urinary retention or frequency Musculoskeletal:  No neck pain,+ back pain Integumentary: No rash, pruritus, skin lesions Neurological: as above Psychiatric: No depression, insomnia, anxiety Endocrine: No  palpitations, fatigue, diaphoresis, mood swings, change in appetite, change in weight, increased thirst Hematologic/Lymphatic:  No anemia, purpura, petechiae. Allergic/Immunologic: no itchy/runny eyes, nasal congestion, recent allergic reactions, rashes  PHYSICAL EXAM: Vitals:   08/10/15 0815  BP: 118/78  Pulse: 77   General: No acute distress Head:  Normocephalic/atraumatic Neck: supple, no paraspinal tenderness, full range of motion Heart:  Regular rate and rhythm Lungs:  Clear to auscultation bilaterally Back: No paraspinal tenderness Skin/Extremities: No rash, no edema Neurological Exam: alert and oriented to person, place, and time. No aphasia or dysarthria. Fund of knowledge is appropriate.  Recent and remote memory are intact.  Attention and concentration are normal.    Able to name objects and repeat phrases. Cranial nerves: Pupils equal, round, reactive to light.  Extraocular movements intact with no nystagmus. Visual fields full. Facial sensation intact. No facial asymmetry. Tongue, uvula, palate midline.  Motor: Bulk and tone normal, muscle strength 5/5  throughout with no pronator drift.  Sensation to light touch intact.  No extinction to double simultaneous stimulation.  Deep tendon reflexes 2+ throughout, toes downgoing.  Finger to nose testing intact.  Gait narrow-based and steady, mild difficulty with tandem walk but able (similar to prior). Romberg negative.  IMPRESSION: This is a 75 yo RH woman with a history of hypertension, hyperlipidemia, diabetes, cardiomyopathy s/p ICD placement, paroxysmal atrial fibrillation off anticoagulation due to intracranial bleed in 2012 with subsequent seizures. She had been seizure-free for 3 years until she had a breakthrough simple partial seizure with left-sided twitching in May 2016. EEG normal, head CT no acute changes. Since her last visit, she reports a cluster of seizures in May 2017 on Keppra 1000mg  BID. She will increase Keppra to 1500mg  BID, continue to monitor for any side effects, she is tolerating it well at this time. She may take prn Ativan, but knows to only take this as needed. If seizures continue despite higher dose Keppra, another AED such as Lamictal or Zonegran will be added on. She will keep a calendar of her seizures and follow-up in 3 months. She is aware of [place] driving laws to stop driving after a seizure, until 6 months seizure-free.   Thank you for allowing me to participate in her care.  Please do not hesitate to call for any questions or concerns.  The duration of this appointment visit was 25 minutes of face-to-face time with the patient.  Greater than 50% of this time was spent in counseling, explanation of diagnosis, planning of further management, and coordination of care.   Ellouise Newer, M.D.   CC: Dr. Jannette Fogo

## 2015-08-10 NOTE — Patient Instructions (Signed)
1. Increase Keppra 1000mg : Take 1 & 1/2 tablets twice a day 2. Keep a calendar of your seizures. Follow-up in 3 months, call for any changes  Seizure Precautions: 1. If medication has been prescribed for you to prevent seizures, take it exactly as directed.  Do not stop taking the medicine without talking to your doctor first, even if you have not had a seizure in a long time.   2. Avoid activities in which a seizure would cause danger to yourself or to others.  Don't operate dangerous machinery, swim alone, or climb in high or dangerous places, such as on ladders, roofs, or girders.  Do not drive unless your doctor says you may.  3. If you have any warning that you may have a seizure, lay down in a safe place where you can't hurt yourself.    4.  No driving for 6 months from last seizure, as per [facility].   Please refer to the following link on the [place] website for more information: http://www.epilepsyfoundation.org/answerplace/Social/driving/drivingu.cfm   5.  Maintain good sleep hygiene. Avoid alcohol.  6.  Contact your doctor if you have any problems that may be related to the medicine you are taking.  7.  Call 911 and bring the patient back to the ED if:        A.  The seizure lasts longer than 5 minutes.       B.  The patient doesn't awaken shortly after the seizure  C.  The patient has new problems such as difficulty seeing, speaking or moving  D.  The patient was injured during the seizure  E.  The patient has a temperature over 102 F (39C)  F.  The patient vomited and now is having trouble breathing

## 2015-08-10 NOTE — Progress Notes (Signed)
Note routed

## 2015-08-17 ENCOUNTER — Ambulatory Visit (INDEPENDENT_AMBULATORY_CARE_PROVIDER_SITE_OTHER): Payer: Medicare HMO | Admitting: Physician Assistant

## 2015-08-17 ENCOUNTER — Telehealth: Payer: Self-pay | Admitting: *Deleted

## 2015-08-17 ENCOUNTER — Encounter: Payer: Self-pay | Admitting: Physician Assistant

## 2015-08-17 ENCOUNTER — Ambulatory Visit
Admission: RE | Admit: 2015-08-17 | Discharge: 2015-08-17 | Disposition: A | Discharge status: Home / Self Care | Payer: Medicare HMO | Source: Ambulatory Visit | Attending: Physician Assistant | Admitting: Physician Assistant

## 2015-08-17 VITALS — BP 142/70 | HR 73 | Ht 64.0 in | Wt 155.4 lb

## 2015-08-17 DIAGNOSIS — I429 Cardiomyopathy, unspecified: Secondary | ICD-10-CM | POA: Diagnosis not present

## 2015-08-17 DIAGNOSIS — I5022 Chronic systolic (congestive) heart failure: Secondary | ICD-10-CM

## 2015-08-17 DIAGNOSIS — I480 Paroxysmal atrial fibrillation: Secondary | ICD-10-CM

## 2015-08-17 DIAGNOSIS — I10 Essential (primary) hypertension: Secondary | ICD-10-CM | POA: Diagnosis not present

## 2015-08-17 DIAGNOSIS — I420 Dilated cardiomyopathy: Secondary | ICD-10-CM

## 2015-08-17 LAB — BASIC METABOLIC PANEL
BUN: 14 mg/dL (ref 7–25)
CHLORIDE: 105 mmol/L (ref 98–110)
CO2: 28 mmol/L (ref 20–31)
CREATININE: 1.1 mg/dL — AB (ref 0.60–0.93)
Calcium: 9 mg/dL (ref 8.6–10.4)
Glucose, Bld: 98 mg/dL (ref 65–99)
Potassium: 4.2 mmol/L (ref 3.5–5.3)
Sodium: 141 mmol/L (ref 135–146)

## 2015-08-17 MED ORDER — FUROSEMIDE 20 MG PO TABS: 20.0000 mg | ORAL_TABLET | Freq: Every day | ORAL | 11 refills | Status: DC

## 2015-08-17 NOTE — Progress Notes (Signed)
Cardiology Office Note Date:  08/17/2015  Patient ID:  [name], [name] 1940-06-06, MRN [id number] PCP:  Bonnita Nasuti, MD  Electrophysiologist:  Dr. Caryl Comes   Chief Complaint: planned f/u, diuresis  History of Present Illness: [name] is a 75 y.o. 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  The patient was last seen by EP service Dr. Caryl Comes last month, noted increased DOE and decreased exertional capacity, at that time, her RV pacing lead was turned off with narrower QRS, he increased the diuretic gently with plans to have her return in about 4 weeks to reassess, may need repeat echo. Dr. Caryl Comes notes: There has been intercurrent but transient normalization of left ventricular systolic function by an echo summer 2012 demonstrating an ejection fraction of 55%-60%. Repeat echo12/12 EF 35% AV optimization echo 12/13 done.  Chest x-ray demonstrates high anterior location Ef 30 % with global hypokinesis Myoview>>Normal perfusion;  Repeat Myoview 3/16 demonstrated normalization of LV function. Echo confirmed this. Catheterization done presumably before her ICD implanted at [facility] demonstrated some years ago no obstructive coronary disease  She comes in today to be seen for Dr. 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  She feels like she is bloated and her pant/waist band is tight.  She reports at the neurologist office last week she weighed 146, though generally when she weighs athome is 156 and her last few visits here 153-154lbs, today 155.4lbs.  She denies symptoms of orthopnea or PND, reports she sleeps well until 2AM, then is up the rest of the night, this unchanged for years, sleeps with 2 pillows.  She denies any CP, palpitations, no  dizziness, near syncope or syncope.She has not been shocked by her device.  Discussed her hx of AFib and she tells me she would not be inclined to consider anticoagulation again.   Device history: SJM CRT-D, implanted 10/10/04 generator replacement in Feb 2013 with insertion of new ICD lead 2/2 externalization of the previous RIATA lead     Past Medical History:  Diagnosis Date  . 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  s/p revision 2/2 ERI 02/28/2011 - [facility] CD 3257  . Chronic systolic heart failure ([place])       . COPD (chronic obstructive pulmonary disease) ([place])    a. pt reports "scarring" at the lungs due to double PNA in the past.  . DM2 (diabetes mellitus, type 2) ([place])   . HLD (hyperlipidemia)   . Hypertension   . Hypokalemia   . LBBB (left bundle branch block)   . NICM (nonischemic cardiomyopathy) ([place])    a. Initial EF of 35%. b. Normal in 07/2009. c. 35% in 2012, 30% in 12/2011. Reported h/o catheterization done presumably before her ICD implanted at [facility] demonstrated some years ago no obstructive coronary disease. Neg nuc 12/2011.  [name] PAF (paroxysmal atrial fibrillation) ([place])    a. Not on anticoag due to h/o SDH.  . Seizures ([place])   . Subdural hematoma ([place])    a. 12/12. Hospitalized at [facility], requiring craniotomy, complicated by seizures.  . Vitamin D deficiency     Past Surgical History:  Procedure Laterality Date  . BRAIN HEMATOMA EVACUATION    . CARDIAC CATHETERIZATION  no significant CAD  . CARDIAC DEFIBRILLATOR PLACEMENT  2006  . CHOLECYSTECTOMY    . IMPLANTABLE CARDIOVERTER DEFIBRILLATOR (ICD) GENERATOR CHANGE N/A 03/01/2011   Procedure: ICD GENERATOR CHANGE;  Surgeon: Deboraha Sprang, MD;  Location: [facility] CATH LAB;  Service: Cardiovascular;  Laterality: N/A;  . TOTAL ABDOMINAL HYSTERECTOMY  1983    Current Outpatient Prescriptions  Medication Sig Dispense Refill  . albuterol (PROVENTIL) (2.5 MG/3ML) 0.083% nebulizer  solution     . Alcohol Swabs (B-D SINGLE USE SWABS REGULAR) PADS     . atorvastatin (LIPITOR) 40 MG tablet Take 40 mg by mouth daily.    . carvedilol (COREG) 25 MG tablet Take 25 mg by mouth 2 (two) times daily with a meal.      . furosemide (LASIX) 20 MG tablet Take 1 tablet (20 mg total) by mouth daily. 30 tablet 11  . levETIRAcetam (KEPPRA) 1000 MG tablet 1,000 mg. Take 1.5 tablets by mouth twice daily    . LORazepam (ATIVAN) 1 MG tablet Take 1 mg by mouth. Take 1 tablet by mouth as needed for seizures. Patient states she takes 2 tablets when needed.    [name] omeprazole (PRILOSEC) 40 MG capsule Take 40 mg by mouth 2 (two) times daily.     . potassium chloride (K-DUR) 10 MEQ tablet Take 2 tablets by mouth twice daily    . sitaGLIPtan (JANUVIA) 100 MG tablet Take 100 mg by mouth daily.      . TRUE METRIX BLOOD GLUCOSE TEST test strip     . TRUEPLUS LANCETS 30G MISC      No current facility-administered medications for this visit.     Allergies:   Codeine; Gemfibrozil; Lactose intolerance (gi); Morphine and related; and Valium   Social History:  The patient  reports that she quit smoking about 47 years ago. Her smoking use included Cigarettes. She has never used smokeless tobacco. She reports that she does not drink alcohol or use drugs.   Family History:  The patient's family history includes Heart disease in her father and mother.  ROS:  Please see the history of present illness.    All other systems are reviewed and otherwise negative.   PHYSICAL EXAM:  VS:  BP (!) 142/70   Pulse 73   Ht 5\' 4"  (1.626 m)   Wt 155 lb 6.4 oz (70.5 kg)   SpO2 97%   BMI 26.67 kg/m  BMI: Body mass index is 26.67 kg/m. Well nourished, well developed, in no acute distress  HEENT: normocephalic, atraumatic  Neck: no JVD, carotid bruits or masses Cardiac:  normal S1, S2; RRR; no significant murmurs, no rubs, or gallops Lungs:  clear to auscultation bilaterally, no wheezing, rhonchi or rales  Abd: soft,  nontender MS: no deformity or atrophy Ext: no edema  Skin: warm and dry, no rash Neuro:  No gross deficits appreciated Psych: euthymic mood, full affect  ICD site is stable, no tethering or discomfort  EKG:  Done 07/19/15 was AV paced  ICD interrogation today:normal device function, no arrhythmias or events, AMS episodes very brief, and do not appear to be true AF with sensor competition, and sensor changed to passive   04/13/14: Echocardiogram Study Conclusions - Left ventricle: The cavity size was normal. Wall thickness was increased in a pattern of mild LVH. Systolic function was normal. The estimated ejection fraction was in the range of 55% to 60%. Wall motion was normal; there were no regional wall motion abnormalities. Doppler parameters are consistent with  abnormal left ventricular relaxation (grade 1 diastolic dysfunction). The E/e&' ratio is between 8-15, suggesting indeterminate LV filling pressure. - Mitral valve: Calcified annulus. - Left atrium: The atrium was normal in size. - Right ventricle: The cavity size was normal. Wall thickness was normal. Pacer wire or catheter noted in right ventricle. Systolic function was normal. - Right atrium: The atrium was normal in size. Pacer wire or catheter noted in right atrium. - Tricuspid valve: There was mild regurgitation. - Pulmonary arteries: PA peak pressure: 24 mm Hg (S). - Inferior vena cava: The vessel was normal in size. The respirophasic diameter changes were in the normal range (= 50%), consistent with normal central venous pressure. Impressions: - Compared to the prior echo in 2013, the EF has normalized. AICD wires are noted. RVSP has improved from 36 mmHg to 24 mmHg.  Recent Labs: 07/19/2015: Brain Natriuretic Peptide 90.3; Hemoglobin 13.4; Platelets 111 08/17/2015: BUN 14; Creat 1.10; Potassium 4.2; Sodium 141  No results found for requested labs within last 8760 hours.   Estimated  Creatinine Clearance: 41.9 mL/min (by C-G formula based on SCr of 1.1 mg/dL).   Wt Readings from Last 3 Encounters:  08/17/15 155 lb 6.4 oz (70.5 kg)  08/10/15 147 lb 6 oz (66.8 kg)  07/19/15 154 lb (69.9 kg)     Other studies reviewed: Additional studies/records reviewed today include:   ASSESSMENT AND PLAN:  1. NICM, last echo with normalized EF    Weight appears unchanged despite additional/temporary increase in her lasix     CorVue is down      Lungs sound clear, minimal edema, c/o bloating     Discussed sodium in her diet, daily weights every morning and will enroll her with L. Short, RN in [facility] clinic Suspect component of COPD as well as fluid OL Will check CXR and BMET today, schedule an echo Increase her lasix to 40mg  daily for 5 days  2. CRT-D     normal device function  3. PAF     CHA2DS2Vasc is at least 6, off a/c after fall with SDH, with recurrent seizures     no AF on ICD, continue to monitor via her device    Given recurrent seizures, continue off a/c for now, the patient tells me shoe would likelynot be agreeable to a/c going forward  4. HTN     Stable  5. COPD     Managed by her PMD  Disposition: f/u in 74mo, sooner if needed  Current medicines are reviewed at length with the patient today.  The patient did not have any concerns regarding medicines.  Haywood Lasso, PA-C 08/17/2015 7:08 PM     [address] [place] [address] [phone] (office)  [phone] (fax)

## 2015-08-17 NOTE — Telephone Encounter (Signed)
-----   Message from [facility], [place] sent at 08/17/2015  1:12 PM EDT ----- Please let the patient know her CXR did not show any fluid accumulation.  Please have her take the extra lasix (40mg  dose) only for 3 days rather then 5 as we discussed when she was here.  Thanks [employer]

## 2015-08-17 NOTE — Telephone Encounter (Signed)
SPOKE TO PT ABOUT RESULTS AND TO ONLY TAKE 40 MG OF LASIX FOR 3 DAYS NOT FIVE. ALSO TOLD PT TO CONTACT OFFICE WITH ANY EXCESSIVE WEIGHT GAIN

## 2015-08-17 NOTE — Patient Instructions (Addendum)
Medication Instructions:     TAKE LASIX 40 MG FOR 5 DAYS ONLY   THEN RESUME BACK TO 20 MG ONCE A DAY   If you need a refill on your cardiac medications before your next appointment, please call your pharmacy.  Labwork: BMET TODAY    Testing/Procedures:  Your physician has requested that you have an echocardiogram. Echocardiography is a painless test that uses sound waves to create images of your heart. It provides your doctor with information about the size and shape of your heart and how well your heart's chambers and valves are working. This procedure takes approximately one hour. There are no restrictions for this procedure.   A chest x-ray takes a picture of the organs and structures inside the chest, including the heart, lungs, and blood vessels. This test can show several things, including, whether the heart is enlarges; whether fluid is building up in the lungs; and whether pacemaker / defibrillator leads are still in place.  Follow-Up: IN ONE MONTH WITH RENEE   Any Other Special Instructions Will Be Listed Below (If Applicable).

## 2015-08-17 NOTE — Progress Notes (Signed)
Patient referred to [facility] clinic by Tommye Standard, PA. Met patient in the office and provided ICM intro.  She agreed to monthly ICM follow ups.  Scheduled 1st ICM encounter for 08/26/2015 due to patient currently has fluid accumulation per office device transmission today.  Furosemide dosage increased by Renee today.  Patient has weight gain and belly bloating on office exam.

## 2015-08-19 ENCOUNTER — Encounter: Payer: Self-pay | Admitting: Internal Medicine

## 2015-08-20 ENCOUNTER — Telehealth: Payer: Self-pay | Admitting: *Deleted

## 2015-08-20 NOTE — Telephone Encounter (Signed)
-----   Message from [place], [place] sent at 08/17/2015  7:39 PM EDT ----- Please let the patient know her lab looks OK, same plan.  Thanks Tommye Standard, [facility]

## 2015-08-20 NOTE — Telephone Encounter (Signed)
SPOKE TO PT ABOUT RESULTS AND VERBALIZED UNDERSTANDING  

## 2015-08-26 ENCOUNTER — Ambulatory Visit (INDEPENDENT_AMBULATORY_CARE_PROVIDER_SITE_OTHER): Payer: Medicare HMO

## 2015-08-26 DIAGNOSIS — I5022 Chronic systolic (congestive) heart failure: Secondary | ICD-10-CM

## 2015-08-26 DIAGNOSIS — Z95810 Presence of automatic (implantable) cardiac defibrillator: Secondary | ICD-10-CM

## 2015-08-26 NOTE — Progress Notes (Signed)
EPIC Encounter for ICM Monitoring  Patient Name: [name] is a 75 y.o. female Date: 08/26/2015 Primary Care Physican: Bonnita Nasuti, MD Primary Cardiologist: Caryl Comes Electrophysiologist: Faustino Congress Weight: 155.4 lb     Heart Failure questions reviewed, pt reported no change in breathing and is SOB, weight has decreased since the taking the extra Furosemide as prescribed during office visit on 08/17/2015. Yesterday's weight, 08/25/2015, was 154 lbs but increased today to 155.4.  She still feels like belly has some swelling.    Thoracic impedance abnormal suggesting fluid accumulation 08/11/2015 to 08/19/2015 and return to normal 08/19/2015.  LABS: 08/17/2015 Creatinine 1.10, BUN 14, Potassium 4.2, Sodium 141 07/19/2015 Creatinine 1.05, BUN 15, Potassium 4.0, Sodium 143 03/30/2014 Creatinine 1.46, BUN 26, Potassium 3.6, Sodium 143 03/23/2014 Creatinine 1.79, BUN 25, Potassium 4.8, Sodium 140  Recommendations:  No changes.  Will send copy to Tommye Standard, [place] and Dr Caryl Comes for review and recommendations if needed.   Repeat transmission on 09/09/2015 and office appointment with Tommye Standard, PA on 09/24/2015.  ICM trend: 08/26/2015     Follow-up plan: ICM clinic phone appointment on 09/09/2015.  Copy of ICM check sent to device physician.   Rosalene Billings, RN 08/26/2015 10:23 AM

## 2015-08-27 ENCOUNTER — Other Ambulatory Visit: Payer: Self-pay

## 2015-08-27 ENCOUNTER — Ambulatory Visit (HOSPITAL_COMMUNITY): Payer: Medicare HMO | Attending: Cardiology

## 2015-08-27 DIAGNOSIS — I5022 Chronic systolic (congestive) heart failure: Secondary | ICD-10-CM

## 2015-08-27 DIAGNOSIS — J449 Chronic obstructive pulmonary disease, unspecified: Secondary | ICD-10-CM | POA: Diagnosis not present

## 2015-08-27 DIAGNOSIS — I059 Rheumatic mitral valve disease, unspecified: Secondary | ICD-10-CM | POA: Insufficient documentation

## 2015-08-27 DIAGNOSIS — I447 Left bundle-branch block, unspecified: Secondary | ICD-10-CM | POA: Insufficient documentation

## 2015-08-27 DIAGNOSIS — I428 Other cardiomyopathies: Secondary | ICD-10-CM | POA: Diagnosis not present

## 2015-08-27 DIAGNOSIS — I110 Hypertensive heart disease with heart failure: Secondary | ICD-10-CM | POA: Insufficient documentation

## 2015-08-27 DIAGNOSIS — I429 Cardiomyopathy, unspecified: Secondary | ICD-10-CM | POA: Diagnosis present

## 2015-08-27 NOTE — Progress Notes (Signed)
Renee Dyane Dustman, PA-C  Rosalene Billings, RN        Margarita Grizzle,   Thank you, CorVue is better, see what Dr. Caryl Comes feels, I think no changes, lets follow her weights and see how her next transmission goes. Her CXR looked OK when I saw her.

## 2015-09-01 ENCOUNTER — Telehealth: Payer: Self-pay | Admitting: *Deleted

## 2015-09-01 NOTE — Telephone Encounter (Signed)
-----   Message from [facility], [place] sent at 08/30/2015  8:12 PM EDT ----- Please let the patient know her heart muscle function looks a little weaker then last years, though still improved from older echos.  No changes at this time.  F/U as planned unless new or excalating symptoms.  Thanks [employer]

## 2015-09-01 NOTE — Telephone Encounter (Signed)
LMOVM TO CONTACT OFFICE ABOUT RESULTS.[name]

## 2015-09-03 ENCOUNTER — Telehealth: Payer: Self-pay | Admitting: *Deleted

## 2015-09-03 NOTE — Telephone Encounter (Signed)
-----   Message from [facility], [place] sent at 08/30/2015  8:12 PM EDT ----- Please let the patient know her heart muscle function looks a little weaker then last years, though still improved from older echos.  No changes at this time.  F/U as planned unless new or excalating symptoms.  Thanks [employer]

## 2015-09-03 NOTE — Telephone Encounter (Signed)
SPOKE TO PT ABOUT RESULTS AND VERBALIZED UNDERSTANDING  

## 2015-09-09 ENCOUNTER — Encounter: Payer: Self-pay | Admitting: Physician Assistant

## 2015-09-09 ENCOUNTER — Ambulatory Visit (INDEPENDENT_AMBULATORY_CARE_PROVIDER_SITE_OTHER): Payer: Medicare HMO

## 2015-09-09 DIAGNOSIS — I5022 Chronic systolic (congestive) heart failure: Secondary | ICD-10-CM

## 2015-09-09 DIAGNOSIS — Z95810 Presence of automatic (implantable) cardiac defibrillator: Secondary | ICD-10-CM

## 2015-09-09 NOTE — Progress Notes (Signed)
EPIC Encounter for ICM Monitoring  Patient Name: [name] is a 75 y.o. female Date: 09/09/2015 Primary Care Physican: Bonnita Nasuti, MD Primary Cardiologist: Caryl Comes Electrophysiologist: Caryl Comes Dry Weight: 156.8 lb         Heart Failure questions reviewed, pt symptomatic with stomach bloating, feet swelling, increased SOB and 2-3 pound weight gain.  She reported 2 seizures this week, 1 on 09/08/2015 and 1 09/06/2015.  She thinks the seizures could be related to stress at home. Her husband has early stages of Alzheimers. She reported compliance with medications.    Thoracic impedance abnormal suggesting fluid accumulation 08/11/2015 to 08/19/2015, 08/29/2015 to 08/31/2015 and 09/01/2015 to transmission date 09/09/2015.  LABS: 08/17/2015 Creatinine 1.10, BUN 14, Potassium 4.2, Sodium 141 07/19/2015 Creatinine 1.05, BUN 15, Potassium 4.0, Sodium 143 03/30/2014 Creatinine 1.46, BUN 26, Potassium 3.6, Sodium 143 03/23/2014 Creatinine 1.79, BUN 25, Potassium 4.8, Sodium 140  Recommendations: Increase Furosemide 20 mg 1 tablet bid x 3 days and return to prescribed dosage of 20 mg 1 tablet daily.  Increase Potassium 10 mEq to 3 tablets bid x 3 days and return to prescribed dosage of 10 mEq 2 tablets bid.   She verbalized understanding.      Follow-up plan: ICM clinic phone appointment on 09/14/2015.  Office appointment with Tommye Standard, PA on 09/24/2015.  Copy of ICM check sent to device physician.   ICM trend: 09/09/2015       Rosalene Billings, RN 09/09/2015 8:52 AM

## 2015-09-14 ENCOUNTER — Ambulatory Visit (INDEPENDENT_AMBULATORY_CARE_PROVIDER_SITE_OTHER): Payer: Medicare HMO

## 2015-09-14 DIAGNOSIS — Z95810 Presence of automatic (implantable) cardiac defibrillator: Secondary | ICD-10-CM

## 2015-09-14 DIAGNOSIS — I5022 Chronic systolic (congestive) heart failure: Secondary | ICD-10-CM

## 2015-09-14 NOTE — Progress Notes (Signed)
EPIC Encounter for ICM Monitoring  Patient Name: [name] is a 75 y.o. female Date: 09/14/2015 Primary Care Physican: Bonnita Nasuti, MD Primary Cardiologist: Caryl Comes Electrophysiologist: Caryl Comes Dry Weight: 154.8 lb        Heart Failure questions reviewed, pt reported improvement of symptoms such as leg swelling and shortness of breath but not completely resolved.  She still has some stomach bloating.  She stated weight continues to fluctuate 2-4 pounds.    Since 09/09/2015 remote transmission, thoracic impedance returned to baseline after taking Furosemide 20 mg 1 tablet bid x 3 days.   LABS: 08/17/2015 Creatinine 1.10, BUN 14, Potassium 4.2, Sodium 141 07/19/2015 Creatinine 1.05, BUN 15, Potassium 4.0, Sodium 143 03/30/2014 Creatinine 1.46, BUN 26, Potassium 3.6, Sodium 143 03/23/2014 Creatinine 1.79, BUN 25, Potassium 4.8, Sodium 140  Recommendations: No changes.  Discussed to eat low sodium foods and to limit salt intake to 2000 mg and fluid intake to 2 liters daily.  She stated she does drink a lot of fluids and advised to limit daily.      Follow-up plan: ICM clinic phone appointment on 10/26/2015.  Office appointment with Tommye Standard, PA on 09/24/2015.  Copy of ICM check sent to device physician.   ICM trend: 09/14/2015       Rosalene Billings, RN 09/14/2015 1:36 PM

## 2015-09-24 ENCOUNTER — Encounter: Payer: Medicare HMO | Admitting: Physician Assistant

## 2015-10-03 NOTE — Progress Notes (Signed)
Cardiology Office Note Date:  10/04/2015  Patient ID:  [name], [name] [birth year], MRN [id number] PCP:  Bonnita Nasuti, MD  Electrophysiologist:  Dr. Caryl Comes   Chief Complaint: planned f/u, diuresis  History of Present Illness: [name] is a 75 y.o. 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  The patient was last seen by EP service Dr. Caryl Comes last month, noted increased DOE and decreased exertional capacity, at that time, his note states that her RV pacing lead was turned off (though her LV lead is off) with narrower QRS, he increased the diuretic gently with plans to have her return in about 4 weeks to reassess, may need repeat echo. Dr. Caryl Comes notes: There has been intercurrent but transient normalization of left ventricular systolic function by an echo summer 2012 demonstrating an ejection fraction of 55%-60%. Repeat echo12/12 EF 35% AV optimization echo 12/13 done.  Chest x-ray demonstrates high anterior location Ef 30 % with global hypokinesis Myoview>>Normal perfusion;  Repeat Myoview 3/16 demonstrated normalization of LV function. Echo confirmed this. Catheterization done presumably before her ICD implanted at [facility] demonstrated some years ago no obstructive coronary disease  She comes in today again to be seen for Dr. 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  She does not feel bloated or swollen.  She suspects her lungs my be the primary component at this time.  States she was explained by her PMD that COPD/emphysema would likely worsen over time.  Generally her home weight is 156 (+/- 2 pounds, bringing her home weight log) appears stable fluctuating within the 2 pound range  up/down.   She again denies symptoms of orthopnea or PND, reports she sleeps well until 2AM, then is up the rest of the night, this unchanged for years, sleeps with 2 pillows.  She denies any CP, palpitations, no dizziness, near syncope or syncope.  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  We have discussed her hx of AFib and she tells me she would not be inclined to consider anticoagulation. She reports that since her last visit she has had 2 seizures is pending her f/u with neurology.     Device history: SJM CRT-D, implanted 10/10/04 generator replacement in Feb 2013 with insertion of new ICD lead 2/2 externalization of the previous RIATA lead     Past Medical History:  Diagnosis Date  . 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  s/p revision 2/2 ERI 02/28/2011 - SJM CD 3257  . Chronic systolic heart failure ([place])       . COPD (chronic obstructive pulmonary disease) ([place])    a. pt reports "scarring" at the lungs due to double PNA in the past.  . DM2 (diabetes mellitus, type 2) ([place])   . HLD (hyperlipidemia)   . Hypertension   . Hypokalemia   . LBBB (left bundle branch block)   . NICM (nonischemic cardiomyopathy) ([place])    a. Initial EF of 35%. b. Normal in 07/2009. c. 35% in 2012, 30% in 12/2011. Reported h/o catheterization done presumably before her  ICD implanted at [facility] demonstrated some years ago no obstructive coronary disease. Neg nuc 12/2011.  [name] PAF (paroxysmal atrial fibrillation) ([place])    a. Not on anticoag due to h/o SDH.  . Seizures ([place])   . Subdural hematoma ([place])    a. 12/12. Hospitalized at [facility], requiring craniotomy, complicated by seizures.  . Vitamin D deficiency     Past Surgical History:  Procedure Laterality Date  . BRAIN HEMATOMA EVACUATION    . CARDIAC CATHETERIZATION     no significant CAD  . CARDIAC  DEFIBRILLATOR PLACEMENT  2006  . CHOLECYSTECTOMY    . IMPLANTABLE CARDIOVERTER DEFIBRILLATOR (ICD) GENERATOR CHANGE N/A 03/01/2011   Procedure: ICD GENERATOR CHANGE;  Surgeon: Deboraha Sprang, MD;  Location: [facility] CATH LAB;  Service: Cardiovascular;  Laterality: N/A;  . TOTAL ABDOMINAL HYSTERECTOMY  1983    Current Outpatient Prescriptions  Medication Sig Dispense Refill  . albuterol (PROVENTIL) (2.5 MG/3ML) 0.083% nebulizer solution     . Alcohol Swabs (B-D SINGLE USE SWABS REGULAR) PADS     . atorvastatin (LIPITOR) 40 MG tablet Take 40 mg by mouth daily.    . carvedilol (COREG) 25 MG tablet Take 25 mg by mouth 2 (two) times daily with a meal.      . furosemide (LASIX) 20 MG tablet Take 1 tablet (20 mg total) by mouth daily. 30 tablet 11  . levETIRAcetam (KEPPRA) 1000 MG tablet 1,000 mg. Take 1.5 tablets by mouth twice daily    . LORazepam (ATIVAN) 1 MG tablet Take 1 mg by mouth. Take 1 tablet by mouth as needed for seizures. Patient states she takes 2 tablets when needed.    [name] omeprazole (PRILOSEC) 40 MG capsule Take 40 mg by mouth 2 (two) times daily.     . potassium chloride (K-DUR) 10 MEQ tablet Take 2 tablets by mouth twice daily    . sitaGLIPtan (JANUVIA) 100 MG tablet Take 100 mg by mouth daily.      . TRUE METRIX BLOOD GLUCOSE TEST test strip     . TRUEPLUS LANCETS 30G MISC      No current facility-administered medications for this visit.     Allergies:   Codeine; Gemfibrozil; Lactose intolerance (gi); Morphine and related; and Valium   Social History:  The patient  reports that she quit smoking about 47 years ago. Her smoking use included Cigarettes. She has never used smokeless tobacco. She reports that she does not drink alcohol or use drugs.   Family History:  The patient's family history includes Heart disease in her father and mother.  ROS:  Please see the history of present illness.    All other systems are reviewed and otherwise negative.   PHYSICAL EXAM:  VS:  BP  126/68   Pulse 74   Ht 5\' 4"  (1.626 m)   Wt 157 lb (71.2 kg)   BMI 26.95 kg/m  BMI: Body mass index is 26.95 kg/m. Well nourished, well developed, in no acute distress  HEENT: normocephalic, atraumatic  Neck: no JVD, carotid bruits or masses Cardiac:  RRR; no significant murmurs, no rubs, or gallops Lungs:  clear to auscultation bilaterally, no wheezing, rhonchi or rales  Abd: soft, nontender MS: no deformity or atrophy Ext: no edema is appreciated  Skin: warm and dry, no rash Neuro:  No gross deficits appreciated Psych: euthymic mood, full affect  ICD site is stable, no tethering or discomfort  EKG:  Done 07/19/15 was AV paced  ICD interrogation today:normal device function, (prorammed  RV not BiVe) pacing, no arrhythmias or events,3 AMS episodes, one is 1:1 the other 2 are very brief PAF  08/27/15: TTE Study Conclusions - Left ventricle: The cavity size was normal. Systolic function was   mildly reduced. The estimated ejection fraction was in the range   of 45% to 50%. Images were inadequate for LV wall motion   assessment. There was an increased relative contribution of   atrial contraction to ventricular filling. Doppler parameters are   consistent with abnormal left ventricular relaxation (grade 1   diastolic dysfunction). Doppler parameters are consistent with   high ventricular filling pressure. - Ventricular septum: Septal motion showed moderate paradox. These   changes are consistent with right ventricular pacing. - Mitral valve: Calcified annulus. - Line: A venous catheter was visualized in the superior vena cava,   with its tip in the right atrium. No abnormal features noted.  04/13/14: Echocardiogram Study Conclusions - Left ventricle: The cavity size was normal. Wall thickness was increased in a pattern of mild LVH. Systolic function was normal. The estimated ejection fraction was in the range of 55% to 60%. Wall motion was normal; there were no regional  wall motion abnormalities. Doppler parameters are consistent with abnormal left ventricular relaxation (grade 1 diastolic dysfunction). The E/e&' ratio is between 8-15, suggesting indeterminate LV filling pressure. - Mitral valve: Calcified annulus. - Left atrium: The atrium was normal in size. - Right ventricle: The cavity size was normal. Wall thickness was normal. Pacer wire or catheter noted in right ventricle. Systolic function was normal. - Right atrium: The atrium was normal in size. Pacer wire or catheter noted in right atrium. - Tricuspid valve: There was mild regurgitation. - Pulmonary arteries: PA peak pressure: 24 mm Hg (S). - Inferior vena cava: The vessel was normal in size. The respirophasic diameter changes were in the normal range (= 50%), consistent with normal central venous pressure. Impressions: - Compared to the prior echo in 2013, the EF has normalized. AICD wires are noted. RVSP has improved from 36 mmHg to 24 mmHg.  Recent Labs: 07/19/2015: Brain Natriuretic Peptide 90.3; Hemoglobin 13.4; Platelets 111 08/17/2015: BUN 14; Creat 1.10; Potassium 4.2; Sodium 141  No results found for requested labs within last 8760 hours.   CrCl cannot be calculated (Patient's most recent lab result is older than the maximum 21 days allowed.).   Wt Readings from Last 3 Encounters:  10/04/15 157 lb (71.2 kg)  08/17/15 155 lb 6.4 oz (70.5 kg)  08/10/15 147 lb 6 oz (66.8 kg)     Other studies reviewed: Additional studies/records reviewed today include:   ASSESSMENT AND PLAN:  1. NICM, last echo with normalized EF     Home weights have been stable     CorVue is at her baseline     Lungs sound clear, no edema or exam findings of fluid OL, her CorVue is stable     CXR at her last visit was withuod pulmonary edema, only COPD chronic changes     Re-enforced sodium restriction, daily weights every morning      She is enrolled with L. Short, RN in [facility]  clinic  I suspect her DOE is more related to her COPD/emphysema at this time, her LVEF 45-50%, LV lead is programmed off, it seems since the last visit with Dr. Caryl Comes in July.   2. CRT-D, programmed RV only (not BiVe), V pacing <1%    normal device function    No changes made  3.  PAF     CHA2DS2Vasc is at least 6, off a/c after fall with SDH, with recurrent seizures    She has had 3 AMS episodes, only seconds in duration, one appears 1:1, the other 2 AF continue to monitor via her device    Given recurrent seizures, continue off a/c for now, the patient has told me she would likely not be agreeable to a/c going forward  4. HTN     Stable  5. COPD/emphysema     Managed by her PMD  Disposition:  No changes for now, she will c/w her PMD for her COPD, will see her back in 6 months, sooner if needed.  Current medicines are reviewed at length with the patient today.  The patient did not have any concerns regarding medicines.  Haywood Lasso, PA-C 10/04/2015 9:38 AM     [place] [address] [place] [address] [phone] (office)  [phone] (fax)

## 2015-10-04 ENCOUNTER — Ambulatory Visit (INDEPENDENT_AMBULATORY_CARE_PROVIDER_SITE_OTHER): Payer: Medicare HMO | Admitting: Physician Assistant

## 2015-10-04 ENCOUNTER — Encounter: Payer: Self-pay | Admitting: Physician Assistant

## 2015-10-04 ENCOUNTER — Encounter (INDEPENDENT_AMBULATORY_CARE_PROVIDER_SITE_OTHER): Payer: Self-pay

## 2015-10-04 VITALS — BP 126/68 | HR 74 | Ht 64.0 in | Wt 157.0 lb

## 2015-10-04 DIAGNOSIS — I420 Dilated cardiomyopathy: Secondary | ICD-10-CM

## 2015-10-04 DIAGNOSIS — I480 Paroxysmal atrial fibrillation: Secondary | ICD-10-CM

## 2015-10-04 DIAGNOSIS — I10 Essential (primary) hypertension: Secondary | ICD-10-CM

## 2015-10-04 NOTE — Patient Instructions (Signed)
Medication Instructions:   Your physician recommends that you continue on your current medications as directed. Please refer to the Current Medication list given to you today.   If you need a refill on your cardiac medications before your next appointment, please call your pharmacy.  Labwork: NONE ORDER TODAY    Testing/Procedures: NONE ORDER TODAY    Follow-Up: Your physician wants you to follow-up in:  [place] will receive a reminder letter in the mail two months in advance. If you don't receive a letter, please call our office to schedule the follow-up appointment.   Remote monitoring is used to monitor your Pacemaker of ICD from home. This monitoring reduces the number of office visits required to check your device to one time per year. It allows [place] to keep an eye on the functioning of your device to ensure it is working properly. You are scheduled for a device check from home on . 01/03/16..You may send your transmission at any time that day. If you have a wireless device, the transmission will be sent automatically. After your physician reviews your transmission, you will receive a postcard with your next transmission date.      Any Other Special Instructions Will Be Listed Below (If Applicable).

## 2015-10-26 ENCOUNTER — Ambulatory Visit (INDEPENDENT_AMBULATORY_CARE_PROVIDER_SITE_OTHER): Payer: Medicare HMO

## 2015-10-26 DIAGNOSIS — Z95810 Presence of automatic (implantable) cardiac defibrillator: Secondary | ICD-10-CM | POA: Diagnosis not present

## 2015-10-26 DIAGNOSIS — I420 Dilated cardiomyopathy: Secondary | ICD-10-CM | POA: Diagnosis not present

## 2015-10-27 NOTE — Progress Notes (Signed)
EPIC Encounter for ICM Monitoring  Patient Name: [name] is a 75 y.o. female Date: 10/27/2015 Primary Care Physican: Bonnita Nasuti, MD Primary Cardiologist: Caryl Comes Electrophysiologist: Caryl Comes Dry Weight: 154 lb      Heart Failure questions reviewed, pt asymptomatic.    Thoracic impedance normal. Impedance showed some dryness and she reported she had sinus and upper respiratory infection and was not drinking as much as usual.  She is feeling fine now.   LABS: 08/17/2015 Creatinine 1.10, BUN 14, Potassium 4.2, Sodium 141 07/19/2015 Creatinine 1.05, BUN 15, Potassium 4.0, Sodium 143 03/30/2014 Creatinine 1.46, BUN 26, Potassium 3.6, Sodium 143 03/23/2014 Creatinine 1.79, BUN 25, Potassium 4.8, Sodium 140   Recommendations: No changes.  Low sodium diet education provided.    Follow-up plan: ICM clinic phone appointment on 11/30/2015.  Copy of ICM check sent to device physician.   ICM trend: 10/26/2015       Rosalene Billings, RN 10/27/2015 11:18 AM

## 2015-11-02 ENCOUNTER — Telehealth: Payer: Self-pay | Admitting: Physician Assistant

## 2015-11-02 NOTE — Telephone Encounter (Signed)
Discussed case with Dr. Caryl Comes, no changes to device programming needed, will add ACE for mild CM, check BMET in 2-3 weeks.  I called patient, she is aware and agreeable to the plan.  She is feeling well without any new complaints or concerns.  Should she develop any symptoms or side effects with the medication she is instructed to call.  Tommye Standard, PA-C

## 2015-11-03 ENCOUNTER — Other Ambulatory Visit: Payer: Self-pay | Admitting: *Deleted

## 2015-11-26 ENCOUNTER — Ambulatory Visit (INDEPENDENT_AMBULATORY_CARE_PROVIDER_SITE_OTHER): Payer: Medicare HMO | Admitting: Neurology

## 2015-11-26 ENCOUNTER — Encounter: Payer: Self-pay | Admitting: Neurology

## 2015-11-26 VITALS — BP 120/70 | HR 80 | Ht 64.0 in | Wt 150.2 lb

## 2015-11-26 DIAGNOSIS — Z8679 Personal history of other diseases of the circulatory system: Secondary | ICD-10-CM | POA: Diagnosis not present

## 2015-11-26 DIAGNOSIS — G40109 Localization-related (focal) (partial) symptomatic epilepsy and epileptic syndromes with simple partial seizures, not intractable, without status epilepticus: Secondary | ICD-10-CM | POA: Diagnosis not present

## 2015-11-26 NOTE — Patient Instructions (Signed)
1. Continue Keppra 1000mg : Take 1.5 tablets twice a day 2. Keep a calendar of your seizures, call for any changes 3. Follow-up in 3 months  Seizure Precautions: 1. If medication has been prescribed for you to prevent seizures, take it exactly as directed.  Do not stop taking the medicine without talking to your doctor first, even if you have not had a seizure in a long time.   2. Avoid activities in which a seizure would cause danger to yourself or to others.  Don't operate dangerous machinery, swim alone, or climb in high or dangerous places, such as on ladders, roofs, or girders.  Do not drive unless your doctor says you may.  3. If you have any warning that you may have a seizure, lay down in a safe place where you can't hurt yourself.    4.  No driving for 6 months from last seizure, as per [facility].   Please refer to the following link on the [place] website for more information: http://www.epilepsyfoundation.org/answerplace/Social/driving/drivingu.cfm   5.  Maintain good sleep hygiene. Avoid alcohol.  6.  Contact your doctor if you have any problems that may be related to the medicine you are taking.  7.  Call 911 and bring the patient back to the ED if:        A.  The seizure lasts longer than 5 minutes.       B.  The patient doesn't awaken shortly after the seizure  C.  The patient has new problems such as difficulty seeing, speaking or moving  D.  The patient was injured during the seizure  E.  The patient has a temperature over 102 F (39C)  F.  The patient vomited and now is having trouble breathing

## 2015-11-26 NOTE — Progress Notes (Signed)
NEUROLOGY FOLLOW UP OFFICE NOTE  [name] [id number]  HISTORY OF PRESENT ILLNESS: I had the pleasure of seeing [name] in follow-up in the neurology clinic on 11/26/2015.  The patient was last seen 3 months ago for seizures. She has a history of craniotomy for right epidural hematoma. On her last visit, she continued to report smaller seizures and a bigger one where her left foot got numb and traveled up the left side of her body. Keppra dose increased to 15000mg  BID. She is tolerating the higher dose. She had 2 seizures in August when the medication was first increased, both lasting 30 minutes, no associated confusion. She just felt a little wobbly but did not fall. She took prn Ativan. No seizures since then. She reports a new diagnosis of heart failure with her heart "functioning 30%." She states her PCP did several tests, which from her description sounds like an ambulatory EEG and MRI brain, results unavailable for review. She reports that she is easily fatigued and feels weak. She lives with her husband and children, her daughter-in-law drives for her. She denies any olfactory/gustatory hallucinations, myoclonic jerks, headaches, dizziness, vision changes.   HPI: This is a 75 yo RH woman with a history of cardiomyopathy s/p ICD placement, hypertension, hyperlipidemia, diabetes, paroxysmal atrial fibrillation, who had a fall in December 2012, found to have multiple right extraaxial intracranial hemorrhages and INR >20 while on Coumadin at that time. She had a witnessed seizure in [place] then transferred to [facility] where she had a right craniotomy of evacuation of epidural hematoma. She had another seizure after the surgery. She was discharged home on Keppra. She had an admission in May 2013 for recurrent seizures, there is note that she was being weaned off seizure medication, then had a seizure and Keppra was restarted. She was reported to have twitching. I personally  reviewed head CT done at that time which showed right frontal craniotomy, no residual fluid collection. Remote cortical infarcts/contusions in the right and left occipital regions. She denies any further seizures until 05/24/14 when she started having uncontrollable twitching on the left side of her face and left arm per ER notes. She tells me today that her whole body was shaking. She was awake and alert, able to communicate, denies confusion. She denied focal weakness after. She was given IV Ativan with resolution of twitching. She had a head CT (images unavailable for review) which showed post-surgical right frontal craniotomy changes, chronic right PCA infarct, no acute abnormalities. She had an EEG reported as normal awake and drowsy EEG. She saw her PCP and had an elevated Keppra level on Keppra 750mg  BID, dose was reduced to 500mg  BID. She denies any further episodes of left-sided twitching. She has a prescription for prn Ativan.   Epilepsy Risk Factors: Right-sided intracranial hemorrhage in 2012 s/p craniotomy. Otherwise she had a normal birth and early development. There is no history of febrile convulsions, CNS infections such as meningitis/encephalitis, or family history of seizures.  PAST MEDICAL HISTORY: Past Medical History:  Diagnosis Date  . 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  s/p revision 2/2 ERI 02/28/2011 - [facility] CD 3257  . Chronic systolic heart failure ([place])       . COPD (chronic obstructive pulmonary disease) ([place])    a. pt reports "scarring" at the lungs due to double PNA in the past.  . DM2 (diabetes mellitus, type 2) ([place])   . HLD (hyperlipidemia)   .  Hypertension   . Hypokalemia   . LBBB (left bundle branch block)   . NICM (nonischemic cardiomyopathy) ([place])    a. Initial EF of 35%. b. Normal in 07/2009. c. 35% in 2012, 30% in 12/2011. Reported h/o catheterization done presumably before her ICD implanted at [facility] demonstrated some years  ago no obstructive coronary disease. Neg nuc 12/2011.  [name] PAF (paroxysmal atrial fibrillation) ([place])    a. Not on anticoag due to h/o SDH.  . Seizures ([place])   . Subdural hematoma ([place])    a. 12/12. Hospitalized at [facility], requiring craniotomy, complicated by seizures.  . Vitamin D deficiency     MEDICATIONS: Current Outpatient Prescriptions on File Prior to Visit  Medication Sig Dispense Refill  . albuterol (PROVENTIL) (2.5 MG/3ML) 0.083% nebulizer solution     . Alcohol Swabs (B-D SINGLE USE SWABS REGULAR) PADS     . atorvastatin (LIPITOR) 40 MG tablet Take 40 mg by mouth daily.    . carvedilol (COREG) 25 MG tablet Take 25 mg by mouth 2 (two) times daily with a meal.      . furosemide (LASIX) 20 MG tablet Take 1 tablet (20 mg total) by mouth daily. 30 tablet 11  . levETIRAcetam (KEPPRA) 1000 MG tablet 1,000 mg. Take 1.5 tablets by mouth twice daily    . LORazepam (ATIVAN) 1 MG tablet Take 1 mg by mouth. Take 1 tablet by mouth as needed for seizures. Patient states she takes 2 tablets when needed.    [name] omeprazole (PRILOSEC) 40 MG capsule Take 40 mg by mouth 2 (two) times daily.     . potassium chloride (K-DUR) 10 MEQ tablet Take 2 tablets by mouth twice daily    . sitaGLIPtan (JANUVIA) 100 MG tablet Take 100 mg by mouth daily.      . TRUE METRIX BLOOD GLUCOSE TEST test strip     . TRUEPLUS LANCETS 30G MISC      No current facility-administered medications on file prior to visit.     ALLERGIES: Allergies  Allergen Reactions  . Codeine Other (See Comments)    "puts me on a trip"  . Gemfibrozil Diarrhea  . Lactose Intolerance (Gi) Diarrhea  . Morphine And Related   . Valium Hives and Rash    FAMILY HISTORY: Family History  Problem Relation Age of Onset  . Heart disease Mother   . Heart disease Father   . Heart disease      SOCIAL HISTORY: Social History   Social History  . Marital status: Married    Spouse name: N/A  . Number of children: 5  . Years of education:  N/A   Occupational History  . retired    Social History Main Topics  . Smoking status: Former Smoker    Types: Cigarettes    Quit date: 01/17/1968  . Smokeless tobacco: Never Used  . Alcohol use No  . Drug use: No  . Sexual activity: Not on file   Other Topics Concern  . Not on file   Social History Narrative   Registration notes - ICD = St. Jude    REVIEW OF SYSTEMS: Constitutional: No fevers, chills, or sweats, no generalized fatigue, change in appetite Eyes: No visual changes, double vision, eye pain Ear, nose and throat: No hearing loss, ear pain, nasal congestion, sore throat Cardiovascular: No chest pain, palpitations Respiratory:  No shortness of breath at rest or with exertion, wheezes GastrointestinaI: No nausea, vomiting, diarrhea, abdominal pain, fecal incontinence Genitourinary:  No dysuria,  urinary retention or frequency Musculoskeletal:  No neck pain,+ back pain Integumentary: No rash, pruritus, skin lesions Neurological: as above Psychiatric: No depression, insomnia, anxiety Endocrine: No palpitations, fatigue, diaphoresis, mood swings, change in appetite, change in weight, increased thirst Hematologic/Lymphatic:  No anemia, purpura, petechiae. Allergic/Immunologic: no itchy/runny eyes, nasal congestion, recent allergic reactions, rashes  PHYSICAL EXAM: Vitals:   11/26/15 0820  BP: 120/70  Pulse: 80   General: No acute distress Head:  Normocephalic/atraumatic Neck: supple, no paraspinal tenderness, full range of motion Heart:  Regular rate and rhythm Lungs:  Clear to auscultation bilaterally Back: No paraspinal tenderness Skin/Extremities: No rash, no edema Neurological Exam: alert and oriented to person, place, and time. No aphasia or dysarthria. Fund of knowledge is appropriate.  Recent and remote memory are intact.  Attention and concentration are normal.    Able to name objects and repeat phrases. Cranial nerves: Pupils equal, round, reactive to  light.  Extraocular movements intact with no nystagmus. Visual fields full. Facial sensation intact. No facial asymmetry. Tongue, uvula, palate midline.  Motor: Bulk and tone normal, muscle strength 5/5 throughout with no pronator drift.  Sensation to light touch intact.  No extinction to double simultaneous stimulation.  Deep tendon reflexes 2+ throughout, toes downgoing.  Finger to nose testing intact.  Gait narrow-based and steady, mild difficulty with tandem walk but able (similar to prior). Romberg negative.  IMPRESSION: This is a 75 yo RH woman with a history of hypertension, hyperlipidemia, diabetes, cardiomyopathy s/p ICD placement, paroxysmal atrial fibrillation off anticoagulation due to intracranial bleed in 2012 with subsequent seizures. She had been seizure-free for 3 years until she had a breakthrough simple partial seizure with left-sided twitching in May 2016. EEG normal, head CT no acute changes. She reported a cluster of seizures in May 2017, Keppra increased to 1500mg  BID, last seizure was at the beginning of August. Continue current dose of Keppra. She has prn Ativan to take for seizure. If seizures continue despite higher dose Keppra, another AED such as Lamictal or Zonegran will be added on. She will keep a calendar of her seizures and follow-up in 3 months. She reports having ?ambulatory EEG and MRI brain done since her last visit, PCP records will be requested for review. She is aware of [place] driving laws to stop driving after a seizure, until 6 months seizure-free.   Thank you for allowing me to participate in her care.  Please do not hesitate to call for any questions or concerns.  The duration of this appointment visit was 15 minutes of face-to-face time with the patient.  Greater than 50% of this time was spent in counseling, explanation of diagnosis, planning of further management, and coordination of care.   Ellouise Newer, M.D.   CC: Dr. Jannette Fogo

## 2015-11-29 ENCOUNTER — Encounter: Payer: Self-pay | Admitting: Neurology

## 2015-11-30 ENCOUNTER — Ambulatory Visit (INDEPENDENT_AMBULATORY_CARE_PROVIDER_SITE_OTHER): Payer: Medicare HMO

## 2015-11-30 DIAGNOSIS — Z95810 Presence of automatic (implantable) cardiac defibrillator: Secondary | ICD-10-CM | POA: Diagnosis not present

## 2015-11-30 DIAGNOSIS — I5022 Chronic systolic (congestive) heart failure: Secondary | ICD-10-CM

## 2015-12-01 NOTE — Progress Notes (Signed)
EPIC Encounter for ICM Monitoring  Patient Name: [name] is a 75 y.o. female Date: 12/01/2015 Primary Care Physican: Bonnita Nasuti, MD Primary [place] Electrophysiologist: Faustino Congress Weight:    150 lb                            Heart Failure questions reviewed, pt asymptomatic.    Thoracic impedance normal.  Was abnormal 11/20/2015 to 11/28/2015 and she denied any fluid symptoms.    LABS: 08/17/2015 Creatinine 1.10, BUN 14, Potassium 4.2, Sodium 141 07/19/2015 Creatinine 1.05, BUN 15, Potassium 4.0, Sodium 143 03/30/2014 Creatinine 1.46, BUN 26, Potassium 3.6, Sodium 143 03/23/2014 Creatinine 1.79, BUN 25, Potassium 4.8, Sodium 140  Recommendations:  No changes.  Advised to limit salt intake to 2000 mg daily.  Encouraged to call for fluid symptoms.    Follow-up plan: ICM clinic phone appointment on 01/03/2016.  Copy of ICM check sent to device physician.   ICM trend: 12/01/2015       [name] Billings, RN 12/01/2015 1:41 PM

## 2016-01-03 ENCOUNTER — Ambulatory Visit (INDEPENDENT_AMBULATORY_CARE_PROVIDER_SITE_OTHER): Payer: Medicare HMO

## 2016-01-03 DIAGNOSIS — I5022 Chronic systolic (congestive) heart failure: Secondary | ICD-10-CM

## 2016-01-03 DIAGNOSIS — Z95810 Presence of automatic (implantable) cardiac defibrillator: Secondary | ICD-10-CM

## 2016-01-03 NOTE — Progress Notes (Signed)
EPIC Encounter for ICM Monitoring  Patient Name: Glori Machnik is a 75 y.o. female Date: 01/03/2016 Primary Care Physican: Bonnita Nasuti, MD Primary [place] Electrophysiologist: Faustino Congress Weight:148lb       Heart Failure questions reviewed, pt has cough and was told by Dr London Pepper it is related to her heart.  She stated Dr London Pepper thought waiting until March to see Dr Caryl Comes was too long of wait and she told Dr London Pepper that is when she was told to make it.  Advised anytime she feels like there is a change in her condition; all she needs to do is call for an earlier appointment.  Advised not to wait until she feels so bad that she needs an immediate an appointment and recommended she call this week to get an earlier appointment.  She stated she has appointment with her PCP in January and will see what he suggests.    Thoracic impedance normal since 12/31/2015 and was abnormal suggesting fluid accumulation from 12/21/2015 to 12/31/2015.  LABS: 08/17/2015 Creatinine 1.10, BUN 14, Potassium 4.2, Sodium 141 07/19/2015 Creatinine 1.05, BUN 15, Potassium 4.0, Sodium 143 03/30/2014 Creatinine 1.46, BUN 26, Potassium 3.6, Sodium 143 03/23/2014 Creatinine 1.79, BUN 25, Potassium 4.8, Sodium 140  Recommendations:  No changes.  Reinforced to limit low salt food choices to 2000 mg day and limiting fluid intake to < 2 liters per day. Encouraged to call for fluid symptoms. Provided ICM direct number.     Follow-up plan: ICM clinic phone appointment on 02/03/2016.  Copy of ICM check sent to primary cardiologist and device physician.   ICM trend: 01/03/2016       Rosalene Billings, RN 01/03/2016 4:00 PM

## 2016-02-03 ENCOUNTER — Ambulatory Visit (INDEPENDENT_AMBULATORY_CARE_PROVIDER_SITE_OTHER): Payer: Medicare HMO

## 2016-02-03 DIAGNOSIS — I5022 Chronic systolic (congestive) heart failure: Secondary | ICD-10-CM | POA: Diagnosis not present

## 2016-02-03 DIAGNOSIS — Z95810 Presence of automatic (implantable) cardiac defibrillator: Secondary | ICD-10-CM

## 2016-02-04 NOTE — Progress Notes (Signed)
EPIC Encounter for ICM Monitoring  Patient Name: [name] is a 76 y.o. female Date: 02/04/2016 Primary Care Physican: Bonnita Nasuti, MD Primary [place] Electrophysiologist: Faustino Congress Weight:unknown       Transmission reviewed.  Thoracic impedance normal.  LABS: 08/17/2015 Creatinine 1.10, BUN 14, Potassium 4.2, Sodium 141 07/19/2015 Creatinine 1.05, BUN 15, Potassium 4.0, Sodium 143 03/30/2014 Creatinine 1.46, BUN 26, Potassium 3.6, Sodium 143 03/23/2014 Creatinine 1.79, BUN 25, Potassium 4.8, Sodium 140  Recommendations: None  Follow-up plan: ICM clinic phone appointment on 03/06/2016.  Copy of ICM check sent to device physician.   3 month ICM trend: 02/03/2016   1 Year ICM trend:      Rosalene Billings, RN 02/04/2016 6:06 PM

## 2016-02-25 ENCOUNTER — Encounter: Payer: Self-pay | Admitting: Neurology

## 2016-02-25 ENCOUNTER — Ambulatory Visit (INDEPENDENT_AMBULATORY_CARE_PROVIDER_SITE_OTHER): Payer: Medicare HMO | Admitting: Neurology

## 2016-02-25 VITALS — BP 120/74 | HR 74 | Ht 64.0 in | Wt 145.2 lb

## 2016-02-25 DIAGNOSIS — Z8679 Personal history of other diseases of the circulatory system: Secondary | ICD-10-CM

## 2016-02-25 DIAGNOSIS — G40109 Localization-related (focal) (partial) symptomatic epilepsy and epileptic syndromes with simple partial seizures, not intractable, without status epilepticus: Secondary | ICD-10-CM

## 2016-02-25 MED ORDER — LAMOTRIGINE 25 MG PO TABS: ORAL_TABLET | ORAL | 3 refills | Status: DC

## 2016-02-25 NOTE — Progress Notes (Signed)
NEUROLOGY FOLLOW UP OFFICE NOTE  [name] [id number]  HISTORY OF PRESENT ILLNESS: I had the pleasure of seeing [name] in follow-up in the neurology clinic on 02/25/2016.  The patient was last seen 3 months ago for seizures. She has a history of craniotomy for right epidural hematoma. 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 She is taking Keppra 1500mg  BID with no side effects. She reports one seizure in December and 2 in January. The second one in January was longer, it took her 2 days to recover. She states she needed to take 4 Ativan tablets during that time period. She would need her cane when she has the seizures, otherwise she ambulates independently. She has occasional headaches, with really bad ones once a month where she just has to lie down and go to sleep. She has noticed difficulties opening bottles and going up stairs. She lives with her husband and children, her daughter-in-law drives for her. She denies any staring/unresponsive episodes, olfactory/gustatory hallucinations, myoclonic jerks, dizziness, vision changes. No falls.   HPI: This is a 76 yo RH woman with a history of cardiomyopathy s/p ICD placement, hypertension, hyperlipidemia, diabetes, paroxysmal atrial fibrillation, who had a fall in December 2012, found to have multiple right extraaxial intracranial hemorrhages and INR >20 while on Coumadin at that time. She had a witnessed seizure in [place] then transferred to [facility] where she had a right craniotomy of evacuation of epidural hematoma. She had another seizure after the surgery. She was discharged home on Keppra. She had an admission in May 2013 for recurrent seizures, there is note that she was being weaned off seizure medication, then had a seizure and Keppra was restarted. She was reported to have twitching. I personally reviewed head CT done at that time which showed right  frontal craniotomy, no residual fluid collection. Remote cortical infarcts/contusions in the right and left occipital regions. She denies any further seizures until 05/24/14 when she started having uncontrollable twitching on the left side of her face and left arm per ER notes. She tells me today that her whole body was shaking. She was awake and alert, able to communicate, denies confusion. She denied focal weakness after. She was given IV Ativan with resolution of twitching. She had a head CT (images unavailable for review) which showed post-surgical right frontal craniotomy changes, chronic right PCA infarct, no acute abnormalities. She had an EEG reported as normal awake and drowsy EEG. She saw her PCP and had an elevated Keppra level on Keppra 750mg  BID, dose was reduced to 500mg  BID. She denies any further episodes of left-sided twitching. She has a prescription for prn Ativan.   Epilepsy Risk Factors: Right-sided intracranial hemorrhage in 2012 s/p craniotomy. Otherwise she had a normal birth and early development. There is no history of febrile convulsions, CNS infections such as meningitis/encephalitis, or family history of seizures.  PAST MEDICAL HISTORY: Past Medical History:  Diagnosis Date  . 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  s/p revision 2/2 ERI 02/28/2011 - [facility] CD 3257  . Chronic systolic heart failure ([place])       . COPD (chronic obstructive pulmonary disease) ([place])    a. pt reports "scarring" at the lungs due to double PNA in the past.  . DM2 (diabetes mellitus, type 2) ([place])   . HLD (hyperlipidemia)   . Hypertension   . Hypokalemia   .  LBBB (left bundle branch block)   . NICM (nonischemic cardiomyopathy) ([place])    a. Initial EF of 35%. b. Normal in 07/2009. c. 35% in 2012, 30% in 12/2011. Reported h/o catheterization done presumably before her ICD implanted at [facility] demonstrated some years ago no obstructive coronary disease. Neg nuc 12/2011.    [name] PAF (paroxysmal atrial fibrillation) ([place])    a. Not on anticoag due to h/o SDH.  . Seizures ([place])   . Subdural hematoma ([place])    a. 12/12. Hospitalized at [facility], requiring craniotomy, complicated by seizures.  . Vitamin D deficiency     MEDICATIONS: Current Outpatient Prescriptions on File Prior to Visit  Medication Sig Dispense Refill  . albuterol (PROVENTIL) (2.5 MG/3ML) 0.083% nebulizer solution     . Alcohol Swabs (B-D SINGLE USE SWABS REGULAR) PADS     . atorvastatin (LIPITOR) 40 MG tablet Take 40 mg by mouth daily.    . carvedilol (COREG) 25 MG tablet Take 25 mg by mouth 2 (two) times daily with a meal.      . furosemide (LASIX) 20 MG tablet Take 1 tablet (20 mg total) by mouth daily. 30 tablet 11  . levETIRAcetam (KEPPRA) 1000 MG tablet 1,000 mg. Take 1.5 tablets by mouth twice daily    . LORazepam (ATIVAN) 1 MG tablet Take 1 mg by mouth. Take 1 tablet by mouth as needed for seizures. Patient states she takes 2 tablets when needed.    [name] omeprazole (PRILOSEC) 40 MG capsule Take 40 mg by mouth 2 (two) times daily.     . potassium chloride (K-DUR) 10 MEQ tablet Take 2 tablets by mouth twice daily    . sacubitril-valsartan (ENTRESTO) 24-26 MG Take 1 tablet by mouth 2 (two) times daily.    . sitaGLIPtan (JANUVIA) 100 MG tablet Take 100 mg by mouth daily.      . TRUE METRIX BLOOD GLUCOSE TEST test strip     . TRUEPLUS LANCETS 30G MISC      No current facility-administered medications on file prior to visit.     ALLERGIES: Allergies  Allergen Reactions  . Codeine Other (See Comments)    "puts me on a trip"  . Gemfibrozil Diarrhea  . Lactose Intolerance (Gi) Diarrhea  . Morphine And Related   . Valium Hives and Rash    FAMILY HISTORY: Family History  Problem Relation Age of Onset  . Heart disease Mother   . Heart disease Father   . Heart disease      SOCIAL HISTORY: Social History   Social History  . Marital status: Married    Spouse name: N/A  . Number of  children: 5  . Years of education: N/A   Occupational History  . retired    Social History Main Topics  . Smoking status: Former Smoker    Types: Cigarettes    Quit date: 01/17/1968  . Smokeless tobacco: Never Used  . Alcohol use No  . Drug use: No  . Sexual activity: Not on file   Other Topics Concern  . Not on file   Social History Narrative   Registration notes - ICD = St. Jude    REVIEW OF SYSTEMS: Constitutional: No fevers, chills, or sweats, no generalized fatigue, change in appetite Eyes: No visual changes, double vision, eye pain Ear, nose and throat: No hearing loss, ear pain, nasal congestion, sore throat Cardiovascular: No chest pain, palpitations Respiratory:  No shortness of breath at rest or with exertion, wheezes GastrointestinaI: No nausea,  vomiting, diarrhea, abdominal pain, fecal incontinence Genitourinary:  No dysuria, urinary retention or frequency Musculoskeletal:  No neck pain,+ back pain Integumentary: No rash, pruritus, skin lesions Neurological: as above Psychiatric: No depression, insomnia, anxiety Endocrine: No palpitations, fatigue, diaphoresis, mood swings, change in appetite, change in weight, increased thirst Hematologic/Lymphatic:  No anemia, purpura, petechiae. Allergic/Immunologic: no itchy/runny eyes, nasal congestion, recent allergic reactions, rashes  PHYSICAL EXAM: Vitals:   02/25/16 0847  BP: 120/74  Pulse: 74   General: No acute distress Head:  Normocephalic/atraumatic Neck: supple, no paraspinal tenderness, full range of motion Heart:  Regular rate and rhythm Lungs:  Clear to auscultation bilaterally Back: No paraspinal tenderness Skin/Extremities: No rash, no edema Neurological Exam: alert and oriented to person, place, and time. No aphasia or dysarthria. Fund of knowledge is appropriate.  Recent and remote memory are intact.  Attention and concentration are normal.    Able to name objects and repeat phrases. Cranial nerves:  Pupils equal, round, reactive to light.  Extraocular movements intact with no nystagmus. Visual fields full. Facial sensation intact. No facial asymmetry. Tongue, uvula, palate midline.  Motor: Bulk and tone normal, muscle strength 5/5 throughout with no pronator drift.  Sensation to light touch intact.  No extinction to double simultaneous stimulation.  Deep tendon reflexes 2+ throughout, toes downgoing.  Finger to nose testing intact.  Gait narrow-based and steady, mild difficulty with tandem walk but able (similar to prior). Romberg negative.  IMPRESSION: This is a 76 yo RH woman with a history of hypertension, hyperlipidemia, diabetes, cardiomyopathy s/p ICD placement, paroxysmal atrial fibrillation off anticoagulation due to intracranial bleed in 2012 with subsequent seizures. She had been seizure-free for 3 years until she had a breakthrough simple partial seizure with left-sided twitching in May 2016. EEG normal, head CT no acute changes. She denies any further twitching, but continues to have simple partial sensory seizures with numbness that travels up her left side. She is taking Keppra 1500mg  BID and will add on low dose Lamotrigine 25mg  qhs x 1 week, then increase to 1 tablet BID. Side effects, including Kathreen Cosier syndrome, were discussed. She has prn Ativan to take for seizure. She will keep a calendar of her seizures and follow-up in 3 months. She is aware of [place] driving laws to stop driving after a seizure, until 6 months seizure-free.   Thank you for allowing me to participate in her care.  Please do not hesitate to call for any questions or concerns.  The duration of this appointment visit was 25 minutes of face-to-face time with the patient.  Greater than 50% of this time was spent in counseling, explanation of diagnosis, planning of further management, and coordination of care.   Ellouise Newer, M.D.   CC: Dr. Jannette Fogo

## 2016-02-25 NOTE — Patient Instructions (Signed)
1. Start Lamotrigine 25mg : Take 1 tablet every night for 2 weeks, then increase to 1 tablet twice a day and continue 2. Continue Keppra 1000mg : Take 1.5 tablets twice a day 3. Keep a calendar of your seizures 4. Follow-up in 3 months, call for any changes  Seizure Precautions: 1. If medication has been prescribed for you to prevent seizures, take it exactly as directed.  Do not stop taking the medicine without talking to your doctor first, even if you have not had a seizure in a long time.   2. Avoid activities in which a seizure would cause danger to yourself or to others.  Don't operate dangerous machinery, swim alone, or climb in high or dangerous places, such as on ladders, roofs, or girders.  Do not drive unless your doctor says you may.  3. If you have any warning that you may have a seizure, lay down in a safe place where you can't hurt yourself.    4.  No driving for 6 months from last seizure, as per [facility].   Please refer to the following link on the [place] website for more information: http://[email or web address].org/answerplace/Social/driving/drivingu.cfm   5.  Maintain good sleep hygiene. Avoid alcohol.  6.  Contact your doctor if you have any problems that may be related to the medicine you are taking.  7.  Call 911 and bring the patient back to the ED if:        A.  The seizure lasts longer than 5 minutes.       B.  The patient doesn't awaken shortly after the seizure  C.  The patient has new problems such as difficulty seeing, speaking or moving  D.  The patient was injured during the seizure  E.  The patient has a temperature over 102 F (39C)  F.  The patient vomited and now is having trouble breathing

## 2016-03-06 ENCOUNTER — Telehealth: Payer: Self-pay | Admitting: Cardiology

## 2016-03-06 ENCOUNTER — Ambulatory Visit (INDEPENDENT_AMBULATORY_CARE_PROVIDER_SITE_OTHER): Payer: Medicare HMO

## 2016-03-06 DIAGNOSIS — I5022 Chronic systolic (congestive) heart failure: Secondary | ICD-10-CM

## 2016-03-06 DIAGNOSIS — Z95810 Presence of automatic (implantable) cardiac defibrillator: Secondary | ICD-10-CM

## 2016-03-06 NOTE — Telephone Encounter (Signed)
Spoke with pt and reminded pt of remote transmission that is due today. Pt verbalized understanding.   

## 2016-03-06 NOTE — Progress Notes (Signed)
EPIC Encounter for ICM Monitoring  Patient Name: [name] is a 76 y.o. female Date: 03/06/2016 Primary Care Physican: Bonnita Nasuti, MD Primary [place] Electrophysiologist: Faustino Congress Weight:unknown          Heart Failure questions reviewed, pt asymptomatic now but had the flu for about 2 weeks which correlates with the decreased impedance for 16 days.    Thoracic impedance normal but was abnormal suggesting fluid accumulation 02/08/2016 to 02/22/2016.  Current prescribed dose of Furosemide 20 mg 1 tablet daily.  Recommendations: No changes. She is still recovering from the flu.  Reminded to limit dietary salt intake to 2000 mg/day and fluid intake to < 2 liters/day. Encouraged to call for fluid symptoms.  Follow-up plan: ICM clinic phone appointment on 04/25/2016 and defib office check with Dr Caryl Comes 03/22/2016   Copy of ICM check sent to device physician.   3 month ICM trend: 03/06/2016   1 Year ICM trend:      Rosalene Billings, RN 03/06/2016 1:18 PM

## 2016-03-16 ENCOUNTER — Encounter: Payer: Self-pay | Admitting: Internal Medicine

## 2016-03-22 ENCOUNTER — Encounter (HOSPITAL_COMMUNITY): Payer: Self-pay | Admitting: General Practice

## 2016-03-22 ENCOUNTER — Encounter: Payer: Medicare HMO | Admitting: Internal Medicine

## 2016-03-22 ENCOUNTER — Telehealth: Payer: Self-pay | Admitting: *Deleted

## 2016-03-22 ENCOUNTER — Observation Stay (HOSPITAL_COMMUNITY)
Admission: AD | Admit: 2016-03-22 | Discharge: 2016-03-23 | Disposition: A | Discharge status: Home / Self Care | Payer: Medicare HMO | Source: Other Acute Inpatient Hospital | Attending: Internal Medicine | Admitting: Internal Medicine

## 2016-03-22 DIAGNOSIS — Z79899 Other long term (current) drug therapy: Secondary | ICD-10-CM | POA: Diagnosis not present

## 2016-03-22 DIAGNOSIS — Y712 Prosthetic and other implants, materials and accessory cardiovascular devices associated with adverse incidents: Secondary | ICD-10-CM | POA: Diagnosis not present

## 2016-03-22 DIAGNOSIS — I110 Hypertensive heart disease with heart failure: Secondary | ICD-10-CM | POA: Diagnosis not present

## 2016-03-22 DIAGNOSIS — J449 Chronic obstructive pulmonary disease, unspecified: Secondary | ICD-10-CM | POA: Diagnosis not present

## 2016-03-22 DIAGNOSIS — Z95810 Presence of automatic (implantable) cardiac defibrillator: Secondary | ICD-10-CM | POA: Diagnosis not present

## 2016-03-22 DIAGNOSIS — Z7984 Long term (current) use of oral hypoglycemic drugs: Secondary | ICD-10-CM | POA: Diagnosis not present

## 2016-03-22 DIAGNOSIS — Z87891 Personal history of nicotine dependence: Secondary | ICD-10-CM | POA: Insufficient documentation

## 2016-03-22 DIAGNOSIS — T82118A Breakdown (mechanical) of other cardiac electronic device, initial encounter: Principal | ICD-10-CM

## 2016-03-22 DIAGNOSIS — E119 Type 2 diabetes mellitus without complications: Secondary | ICD-10-CM | POA: Diagnosis not present

## 2016-03-22 DIAGNOSIS — I5022 Chronic systolic (congestive) heart failure: Secondary | ICD-10-CM | POA: Insufficient documentation

## 2016-03-22 HISTORY — DX: Breakdown (mechanical) of other cardiac electronic device, initial encounter: T82.118A

## 2016-03-22 LAB — CBC
HCT: 36.5 % (ref 36.0–46.0)
HEMOGLOBIN: 12.1 g/dL (ref 12.0–15.0)
MCH: 30.3 pg (ref 26.0–34.0)
MCHC: 33.2 g/dL (ref 30.0–36.0)
MCV: 91.3 fL (ref 78.0–100.0)
PLATELETS: 120 10*3/uL — AB (ref 150–400)
RBC: 4 MIL/uL (ref 3.87–5.11)
RDW: 13.5 % (ref 11.5–15.5)
WBC: 4.9 10*3/uL (ref 4.0–10.5)

## 2016-03-22 LAB — CREATININE, SERUM
CREATININE: 1.22 mg/dL — AB (ref 0.44–1.00)
GFR calc Af Amer: 49 mL/min — ABNORMAL LOW (ref >60)
GFR calc non Af Amer: 42 mL/min — ABNORMAL LOW (ref >60)

## 2016-03-22 LAB — MRSA PCR SCREENING: MRSA BY PCR: NEGATIVE

## 2016-03-22 LAB — GLUCOSE, CAPILLARY
GLUCOSE-CAPILLARY: 194 mg/dL — AB (ref 65–99)
Glucose-Capillary: 92 mg/dL (ref 65–99)

## 2016-03-22 MED ORDER — ALBUTEROL SULFATE (2.5 MG/3ML) 0.083% IN NEBU
2.5000 mg | INHALATION_SOLUTION | Freq: Three times a day (TID) | RESPIRATORY_TRACT | Status: DC
  Administered 2016-03-23: 2.5 mg via RESPIRATORY_TRACT
  Filled 2016-03-22: qty 3

## 2016-03-22 MED ORDER — LINAGLIPTIN 5 MG PO TABS
5.0000 mg | ORAL_TABLET | Freq: Every day | ORAL | Status: DC
  Administered 2016-03-22 – 2016-03-23 (×2): 5 mg via ORAL
  Filled 2016-03-22 (×2): qty 1

## 2016-03-22 MED ORDER — LAMOTRIGINE 25 MG PO TABS
25.0000 mg | ORAL_TABLET | Freq: Two times a day (BID) | ORAL | Status: DC
  Administered 2016-03-22 – 2016-03-23 (×2): 25 mg via ORAL
  Filled 2016-03-22 (×2): qty 1

## 2016-03-22 MED ORDER — ONDANSETRON HCL 4 MG/2ML IJ SOLN: 4.0000 mg | Freq: Four times a day (QID) | INTRAMUSCULAR | Status: DC | PRN

## 2016-03-22 MED ORDER — FUROSEMIDE 20 MG PO TABS
20.0000 mg | ORAL_TABLET | Freq: Every day | ORAL | Status: DC
  Administered 2016-03-22: 20 mg via ORAL
  Filled 2016-03-22: qty 1

## 2016-03-22 MED ORDER — ACETAMINOPHEN 325 MG PO TABS: 650.0000 mg | ORAL_TABLET | ORAL | Status: DC | PRN

## 2016-03-22 MED ORDER — NITROGLYCERIN 0.4 MG SL SUBL: 0.4000 mg | SUBLINGUAL_TABLET | SUBLINGUAL | Status: DC | PRN

## 2016-03-22 MED ORDER — ATORVASTATIN CALCIUM 40 MG PO TABS
40.0000 mg | ORAL_TABLET | Freq: Every day | ORAL | Status: DC
  Administered 2016-03-22 – 2016-03-23 (×2): 40 mg via ORAL
  Filled 2016-03-22 (×2): qty 1

## 2016-03-22 MED ORDER — SACUBITRIL-VALSARTAN 24-26 MG PO TABS
1.0000 | ORAL_TABLET | Freq: Two times a day (BID) | ORAL | Status: DC
  Administered 2016-03-22 – 2016-03-23 (×2): 1 via ORAL
  Filled 2016-03-22 (×2): qty 1

## 2016-03-22 MED ORDER — ALBUTEROL SULFATE (2.5 MG/3ML) 0.083% IN NEBU: 2.5000 mg | INHALATION_SOLUTION | Freq: Four times a day (QID) | RESPIRATORY_TRACT | Status: DC | PRN

## 2016-03-22 MED ORDER — INSULIN ASPART 100 UNIT/ML ~~LOC~~ SOLN: 0.0000 [IU] | Freq: Three times a day (TID) | SUBCUTANEOUS | Status: DC

## 2016-03-22 MED ORDER — ALBUTEROL SULFATE (2.5 MG/3ML) 0.083% IN NEBU
2.5000 mg | INHALATION_SOLUTION | Freq: Three times a day (TID) | RESPIRATORY_TRACT | Status: DC
  Administered 2016-03-22: 2.5 mg via RESPIRATORY_TRACT
  Filled 2016-03-22: qty 3

## 2016-03-22 MED ORDER — LEVETIRACETAM 750 MG PO TABS
1500.0000 mg | ORAL_TABLET | Freq: Two times a day (BID) | ORAL | Status: DC
  Administered 2016-03-22 – 2016-03-23 (×2): 1500 mg via ORAL
  Filled 2016-03-22 (×2): qty 2

## 2016-03-22 MED ORDER — CARVEDILOL 25 MG PO TABS
25.0000 mg | ORAL_TABLET | Freq: Two times a day (BID) | ORAL | Status: DC
  Administered 2016-03-22 – 2016-03-23 (×2): 25 mg via ORAL
  Filled 2016-03-22 (×2): qty 1

## 2016-03-22 MED ORDER — ENOXAPARIN SODIUM 40 MG/0.4ML ~~LOC~~ SOLN
40.0000 mg | SUBCUTANEOUS | Status: DC
  Administered 2016-03-22: 40 mg via SUBCUTANEOUS
  Filled 2016-03-22: qty 0.4

## 2016-03-22 MED ORDER — PANTOPRAZOLE SODIUM 40 MG PO TBEC
80.0000 mg | DELAYED_RELEASE_TABLET | Freq: Every day | ORAL | Status: DC
  Administered 2016-03-22: 80 mg via ORAL
  Administered 2016-03-23: 40 mg via ORAL
  Filled 2016-03-22 (×2): qty 2

## 2016-03-22 MED ORDER — POTASSIUM CHLORIDE ER 10 MEQ PO TBCR
20.0000 meq | EXTENDED_RELEASE_TABLET | Freq: Two times a day (BID) | ORAL | Status: DC
  Administered 2016-03-22 – 2016-03-23 (×2): 20 meq via ORAL
  Filled 2016-03-22 (×4): qty 2

## 2016-03-22 NOTE — Telephone Encounter (Signed)
Spoke to son about Human resources officer. Son said that patient is currently being treated in the ER in [facility] d/t multiple shocks. Son believes patient will be transferred to [facility] at some point today.  Informed Chanetta Marshall, NP about patient's location per son.  Bryan Small, STJ was also made aware of patient's recent episodes.

## 2016-03-22 NOTE — Telephone Encounter (Signed)
Remote from this am reviewed. (3) "VF" episodes recorded, 32 "VT-NS" episodes--all appear to be lead noise per EGMs, ICD shocks aborted.   Spoke to [employer], NP this am who recommended that patient f/u with her in office.

## 2016-03-22 NOTE — Progress Notes (Signed)
Received pt by EMS from [facility] ED via stretcher into room [phone].  Awake, alert, and oriented x 4. States "debifrillator had fired, first time at 8 o'clock this morning" Appeared in no acute distress upon arrival.Denies any chest pain, pressure, shortness of breath. Card Master called and made aware of pt arrival to the floor. Reported will send MD/NP/PA to admit. PR BP 80 /22/122/66/ 97% room air.

## 2016-03-22 NOTE — H&P (Signed)
[name] is an 76 y.o. female.   Chief Complaint: "My ICD has shocked me 20 times" HPI: The patient is a 76 yo woman with a h/o non-ischemic CM, s/p ICD implant initially placed over 10 years ago. She underwent attempted lead revision but was found to have a subtotally occluded subclavian vein and a new ICD lead could not be placed. She was noted at that time to have externalization of her ICD lead. She has not had chest pain or sob. She was in her usual state of health until earlier this morning when she began to be shocked by her ICD. The patient did not have associated chest pain or palpitations. No syncope.   Past Medical History:  Diagnosis Date  . 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  s/p revision 2/2 ERI 02/28/2011 - [facility] CD 3257  . Chronic systolic heart failure ([place])       . COPD (chronic obstructive pulmonary disease) ([place])    a. pt reports "scarring" at the lungs due to double PNA in the past.  . DM2 (diabetes mellitus, type 2) ([place])   . HLD (hyperlipidemia)   . Hypertension   . Hypokalemia   . LBBB (left bundle branch block)   . NICM (nonischemic cardiomyopathy) ([place])    a. Initial EF of 35%. b. Normal in 07/2009. c. 35% in 2012, 30% in 12/2011. Reported h/o catheterization done presumably before her ICD implanted at [facility] demonstrated some years ago no obstructive coronary disease. Neg nuc 12/2011.  [name] PAF (paroxysmal atrial fibrillation) ([name])    a. Not on anticoag due to h/o SDH.  . Seizures ([place])   . Subdural hematoma ([place])    a. 12/12. Hospitalized at [facility], requiring craniotomy, complicated by seizures.  . Vitamin D deficiency     Past Surgical History:  Procedure Laterality Date  . BRAIN HEMATOMA EVACUATION    . CARDIAC CATHETERIZATION     no significant CAD  . CARDIAC DEFIBRILLATOR PLACEMENT  2006  . CHOLECYSTECTOMY    . IMPLANTABLE CARDIOVERTER DEFIBRILLATOR (ICD) GENERATOR CHANGE N/A 03/01/2011   Procedure: ICD GENERATOR  CHANGE;  Surgeon: Deboraha Sprang, MD;  Location: [facility] CATH LAB;  Service: Cardiovascular;  Laterality: N/A;  . TOTAL ABDOMINAL HYSTERECTOMY  1983    Family History  Problem Relation Age of Onset  . Heart disease Mother   . Heart disease Father   . Heart disease     Social History:  reports that she quit smoking about 48 years ago. Her smoking use included Cigarettes. She has never used smokeless tobacco. She reports that she does not drink alcohol or use drugs.  Allergies:  Allergies  Allergen Reactions  . Codeine Other (See Comments)    "puts me on a trip"  . Gemfibrozil Diarrhea  . Lactose Intolerance (Gi) Diarrhea  . Morphine And Related   . Valium Hives and Rash    Medications Prior to Admission  Medication Sig Dispense Refill  . albuterol (PROVENTIL) (2.5 MG/3ML) 0.083% nebulizer solution     . Alcohol Swabs (B-D SINGLE USE SWABS REGULAR) PADS     . atorvastatin (LIPITOR) 40 MG tablet Take 40 mg by mouth daily.    . carvedilol (COREG) 25 MG tablet Take 25 mg by mouth 2 (two) times daily with a meal.      . furosemide (LASIX) 20 MG tablet Take 1 tablet (20 mg total) by mouth daily. 30 tablet 11  . lamoTRIgine (LAMICTAL) 25 MG tablet  Take 1 tablet at night for 2 weeks, then increase to 1 tablet twice a day 180 tablet 3  . levETIRAcetam (KEPPRA) 1000 MG tablet 1,000 mg. Take 1.5 tablets by mouth twice daily    . LORazepam (ATIVAN) 1 MG tablet Take 1 mg by mouth. Take 1 tablet by mouth as needed for seizures. Patient states she takes 2 tablets when needed.    . Multiple Vitamins-Calcium (ONE-A-DAY WOMENS PO) Take by mouth.    [name] omeprazole (PRILOSEC) 40 MG capsule Take 40 mg by mouth 2 (two) times daily.     . potassium chloride (K-DUR) 10 MEQ tablet Take 2 tablets by mouth twice daily    . sacubitril-valsartan (ENTRESTO) 24-26 MG Take 1 tablet by mouth 2 (two) times daily.    . sitaGLIPtan (JANUVIA) 100 MG tablet Take 100 mg by mouth daily.      . TRUE METRIX BLOOD GLUCOSE TEST  test strip     . TRUEPLUS LANCETS 30G MISC       Results for orders placed or performed during the hospital encounter of 03/22/16 (from the past 48 hour(s))  Glucose, capillary     Status: None   Collection Time: 03/22/16  4:24 PM  Result Value Ref Range   Glucose-Capillary 92 65 - 99 mg/dL   No results found.  ROS - all systems reviewed and negative except as noted above.  Blood pressure 122/66, pulse 80, resp. rate (!) 22, height 5\' 4"  (1.626 m), weight 151 lb 14.4 oz (68.9 kg), SpO2 97 %. Physical Exam  anxious appearing 76 yo woman, NAD HEENT: Unremarkable,[place], AT Neck:  6 JVD, no thyromegally Back:  No CVA tenderness Lungs:  Clear with no wheezes, rales, or rhonchi HEART:  Regular rate rhythm, no murmurs, no rubs, no clicks Abd:  soft, positive bowel sounds, no organomegally, no rebound, no guarding Ext:  2 plus pulses, no edema, no cyanosis, no clubbing Skin:  No rashes no nodules Neuro:  CN II through XII intact, motor grossly intact  EKG: normal EKG, normal sinus rhythm, unchanged from previous tracings.  CXR - reviewed. Externalization of the ICD lead is present  ICD interogation - she has lead noise resulting in multiple (53) ICD shocks  Assessment/Plan 1. ICD lead malfunction - she has had her device turned off. I will discuss with Dr. Renaldo Reel. She has never had an appropriate ICD therapy and her EF is better. I would favor turning the device off and leaving it in placed. Alternatives would be to extract the old system.  2. Chronic systolic heart failure - her symptoms are class 2. Will follow. 3. Anxiety - she is appropriately anxious. I have tried to reassure her.  Gregg Taylor,M.D.  Cristopher Peru 03/22/2016, 4:56 PM

## 2016-03-22 NOTE — Telephone Encounter (Signed)
Called son back to see if patient was still at [facility]. Son put me on the phone with daughter in law, who states that patient will remain at [facility] until a bed is available at [facility]. I asked daughter in law if patient has a magnet placed over the device. She said that she did not believe there was one in place. I explained to her that keeping a magnet on the device will keep pt from receiving anymore inappropriate shocks.   Daughter in law voiced understanding and put me on the phone with Carmell Austria, Therapist, sports. I told Carmell Austria about the lead malfunction and how a magnet would keep patient from being shocked. Carmell Austria, RN then put me on the phone with Dr. Charlies Silvers. I told him about the lead malfunction and the need for a magnet. Dr.Snyder voiced understanding and appreciation of call.

## 2016-03-23 DIAGNOSIS — T82118A Breakdown (mechanical) of other cardiac electronic device, initial encounter: Secondary | ICD-10-CM | POA: Diagnosis not present

## 2016-03-23 LAB — GLUCOSE, CAPILLARY: Glucose-Capillary: 125 mg/dL — ABNORMAL HIGH (ref 65–99)

## 2016-03-23 LAB — BASIC METABOLIC PANEL
ANION GAP: 9 (ref 5–15)
BUN: 22 mg/dL — ABNORMAL HIGH (ref 6–20)
CALCIUM: 8.7 mg/dL — AB (ref 8.9–10.3)
CO2: 23 mmol/L (ref 22–32)
Chloride: 109 mmol/L (ref 101–111)
Creatinine, Ser: 1.23 mg/dL — ABNORMAL HIGH (ref 0.44–1.00)
GFR, EST AFRICAN AMERICAN: 48 mL/min — AB (ref >60)
GFR, EST NON AFRICAN AMERICAN: 42 mL/min — AB (ref >60)
GLUCOSE: 112 mg/dL — AB (ref 65–99)
Potassium: 3.9 mmol/L (ref 3.5–5.1)
SODIUM: 141 mmol/L (ref 135–145)

## 2016-03-23 MED ORDER — PANTOPRAZOLE SODIUM 40 MG PO TBEC: 40.0000 mg | DELAYED_RELEASE_TABLET | Freq: Two times a day (BID) | ORAL | Status: DC

## 2016-03-23 NOTE — Discharge Summary (Signed)
DISCHARGE SUMMARY    Patient ID: [name],  MRN: [id number], DOB/AGE: Jan 16, 1941 76 y.o.  Admit date: 03/22/2016 Discharge date: 03/23/2016  Primary Care Physician: Bonnita Nasuti, MD  Primary Cardiologist/Electrophysiologist: Dr. Caryl Comes  Primary Discharge Diagnosis:  1. 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 NICM     w/improvment in LVEF 2. COPD 3. DM 4. HTN 5. HLD 6. PAFib     CHA2DS2Vasc is at least 6     Not on a/c 2/2 hx of SDH with subsequent seizures (which continue intermittently)     In previous d/w the patient, she has remained unagreeable to consider restarting a/c as well  Allergies  Allergen Reactions  . Codeine Other (See Comments)    "puts me on a trip"  . Gemfibrozil Diarrhea  . Lactose Intolerance (Gi) Diarrhea  . Morphine And Related Other (See Comments)  . Valium Hives and Rash     Procedures This Admission:  None  Brief HPI: [name] is a 76 y.o. 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  Device transmission noted noise on her lead and a magnet was placed to avoid further shocks, and she was transferred to [facility] for further evaluation and management.  Hospital Course:  The patient was admitted and her device interrogated, confirming noise on her RV lead, lead failure with inappropriate shocks/therapies (53), and tachy therapies have been disabled/turned off.  Records note known externalization of the RV lead, thought to have been replaced at the time of her generator change, though was unsuccessful with SCV stenosis.  Lead had stable measurements/function until now.  her The patient reported feeling in her usual state of health with no particular  symptoms at all, she was walking down the hall towards the kitchen when she began getting shocked.  She had no pre or post shock palpitations, near syncope or syncope, no recent or active c/o CP. She had elevated Trop noted at [facility], this secondary to the numerous shocks, otherwise labs unremarkable, EKG was SR.  She was monitored on telemetry noting SR only, without arrhythmias, she remains without complaints, and no CP.   In discussion with dr. Caryl Comes, will discharge to follow up with him to discuss options regarding her device going forward. The patient was examined by Dr. Lovena Le and considered stable for discharge to home, no changes are being made to her home medicines.  Follow up with Dr. Caryl Comes has been arranged.   Physical Exam: Vitals:   03/22/16 1609 03/22/16 1948 03/23/16 0300  BP: 122/66 99/62 124/63  Pulse: 80 84 85  Resp: (!) 22 19 (!) 23  Temp: 98.1 F (36.7 C) 97.7 F (36.5 C) 98 F (36.7 C)  TempSrc: Oral Oral Oral  SpO2: 97% 97% 97%  Weight: 151 lb 14.4 oz (68.9 kg)  142 lb 12.8 oz (64.8 kg)  Height: 5\' 4"  (1.626 m)      GEN- The patient is well appearing, alert and oriented x 3 today.   HEENT: normocephalic, atraumatic; sclera clear, conjunctiva pink; hearing intact; oropharynx clear; neck supple, no JVP Lungs- CTA b/l, normal work of breathing.  No wheezes, rales, rhonchi Heart- RRR, no murmurs, rubs or gallops, PMI not laterally displaced GI- soft,  non-tender, non-distended Extremities- no clubbing, cyanosis, or edema MS- no significant deformity or atrophy Skin- warm and dry, no rash or lesion Psych- euthymic mood, full affect Neuro- no gross deficits   Labs:   Lab Results  Component Value Date   WBC 4.9 03/22/2016   HGB 12.1 03/22/2016   HCT 36.5 03/22/2016   MCV 91.3 03/22/2016   PLT 120 (L) 03/22/2016     Recent Labs Lab 03/23/16 0350  NA 141  K 3.9  CL 109  CO2 23  BUN 22*  CREATININE 1.23*  CALCIUM 8.7*  GLUCOSE 112*    Discharge  Medications:  Allergies as of 03/23/2016      Reactions   Codeine Other (See Comments)   "puts me on a trip"   Gemfibrozil Diarrhea   Lactose Intolerance (gi) Diarrhea   Morphine And Related Other (See Comments)   Valium Hives, Rash      Medication List    TAKE these medications   albuterol 108 (90 Base) MCG/ACT inhaler Commonly known as:  PROVENTIL HFA;VENTOLIN HFA Inhale 1 puff into the lungs every 6 (six) hours as needed for wheezing or shortness of breath.   albuterol (2.5 MG/3ML) 0.083% nebulizer solution Commonly known as:  PROVENTIL Take 2.5 mg by nebulization every 6 (six) hours as needed for wheezing or shortness of breath.   atorvastatin 40 MG tablet Commonly known as:  LIPITOR Take 40 mg by mouth daily at 6 PM.   carvedilol 25 MG tablet Commonly known as:  COREG Take 25 mg by mouth 2 (two) times daily with a meal.   ENTRESTO 24-26 MG Generic drug:  sacubitril-valsartan Take 1 tablet by mouth 2 (two) times daily.   furosemide 20 MG tablet Commonly known as:  LASIX Take 1 tablet (20 mg total) by mouth daily.   lamoTRIgine 25 MG tablet Commonly known as:  LAMICTAL Take 1 tablet at night for 2 weeks, then increase to 1 tablet twice a day What changed:  how much to take  how to take this  when to take this  additional instructions Notes to patient:  Continue this medicine as you were initially instructed, no changes are being made   levETIRAcetam 1000 MG tablet Commonly known as:  KEPPRA Take 1,500 mg by mouth 2 (two) times daily. Take 1.5 tablets by mouth twice daily   LORazepam 1 MG tablet Commonly known as:  ATIVAN Take 2 mg by mouth every 8 (eight) hours as needed for seizure. Patient states she takes 2 tablets when needed.   nitroGLYCERIN 0.4 MG SL tablet Commonly known as:  NITROSTAT Place 0.4 mg under the tongue every 5 (five) minutes as needed for chest pain.   omeprazole 40 MG capsule Commonly known as:  PRILOSEC Take 40 mg by mouth 2  (two) times daily.   ONE-A-DAY WOMENS PO Take 1 tablet by mouth daily.   potassium chloride 10 MEQ tablet Commonly known as:  K-DUR Take 20 mEq by mouth 2 (two) times daily. Take 2 tablets by mouth twice daily   sitaGLIPtin 100 MG tablet Commonly known as:  JANUVIA Take 100 mg by mouth daily.       Disposition:  Home Discharge Instructions    Diet - low sodium heart healthy    Complete by:  As directed    Increase activity slowly    Complete by:  As directed      Follow-up Information    Virl Axe, MD Follow up on 04/24/2016.   Specialty:  Cardiology Why:  9:45AM Contact information: [address] [address] [phone]           Duration of Discharge Encounter: Greater than 30 minutes including physician time.  Venetia Night, PA-C 03/23/2016 9:09 AM  EP Attending  Patient seen and examined. Agree with above. She is stable for DC. Her device tachy therapies are off. Her pacing is turned off. She will followup with Dr. Renaldo Reel.  Mikle Bosworth.D.

## 2016-03-23 NOTE — Plan of Care (Signed)
Problem: Safety: Goal: Ability to remain free from injury will improve Outcome: Completed/Met Date Met: 03/23/16 Patient has been educated to call for staff assistance before getting out of bed. Bed is in lowest position; bed alarm on; call bell within reach.  Problem: Pain Managment: Goal: General experience of comfort will improve Outcome: Progressing Patient has had no complaints of pain during this shift; will continue to reassess.

## 2016-03-24 ENCOUNTER — Encounter: Payer: Medicare HMO | Admitting: Internal Medicine

## 2016-04-12 ENCOUNTER — Encounter: Payer: Self-pay | Admitting: Internal Medicine

## 2016-04-24 ENCOUNTER — Ambulatory Visit (INDEPENDENT_AMBULATORY_CARE_PROVIDER_SITE_OTHER): Payer: Medicare HMO | Admitting: Internal Medicine

## 2016-04-24 VITALS — BP 90/60 | HR 85 | Ht 64.0 in | Wt 141.0 lb

## 2016-04-24 DIAGNOSIS — I480 Paroxysmal atrial fibrillation: Secondary | ICD-10-CM

## 2016-04-24 DIAGNOSIS — M7989 Other specified soft tissue disorders: Secondary | ICD-10-CM | POA: Diagnosis not present

## 2016-04-24 DIAGNOSIS — I5022 Chronic systolic (congestive) heart failure: Secondary | ICD-10-CM

## 2016-04-24 DIAGNOSIS — Z95810 Presence of automatic (implantable) cardiac defibrillator: Secondary | ICD-10-CM

## 2016-04-24 DIAGNOSIS — I428 Other cardiomyopathies: Secondary | ICD-10-CM | POA: Diagnosis not present

## 2016-04-24 NOTE — Progress Notes (Signed)
Patient Care Team: [name], MD as PCP - General (Internal Medicine)   HPI  [name] is a 76 y.o. female seen in followup for CRT-D implanted at [facility] for nonischemic cardiomyopathy.She underwent CRT device generator replacement  There has been intercurrent but transient normalization of left ventricular systolic function by an echo summer 2012 demonstrating an ejection fraction of 55%-60%. Repeat echo12/12 EF 35%  Echo 817 EF 45-50%.  We do not have these records as the patient was hospitalized at [facility] with subdural hematoma requiring craniotomy complicated by seizures.     She struggles with worsening shortness of breath. While she has had this complaint before, she says it has become significantly worse since her recent hospitalization  She was hospitalized March 2018 for inappropriate shocks secondary to lead failure. She had a Riata lead with known externalization. High-voltage therapies were inactivated.  She's having some orthopnea.   nea     Past Medical History:  Diagnosis Date  . 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  s/p revision 2/2 ERI 02/28/2011 - [facility] CD 3257  . Chronic systolic heart failure ([place])       . COPD (chronic obstructive pulmonary disease) ([place])    a. pt reports "scarring" at the lungs due to double PNA in the past.  . DM2 (diabetes mellitus, type 2) ([place])   . HLD (hyperlipidemia)   . Hypertension   . Hypokalemia   . ICD (implantable cardioverter-defibrillator) malfunction 03/22/2016  . LBBB (left bundle branch block)   . NICM (nonischemic cardiomyopathy) ([place])    a. Initial EF of 35%. b. Normal in 07/2009. c. 35% in 2012, 30% in 12/2011. Reported h/o catheterization done presumably before her ICD implanted at [facility] demonstrated some years ago no obstructive coronary disease. Neg nuc 12/2011.  [name] PAF (paroxysmal atrial fibrillation) ([place])    a. Not on anticoag due to h/o SDH.  . Seizures ([place])     . Subdural hematoma ([place])    a. 12/12. Hospitalized at [facility], requiring craniotomy, complicated by seizures.  . Vitamin D deficiency     Past Surgical History:  Procedure Laterality Date  . BRAIN HEMATOMA EVACUATION    . CARDIAC CATHETERIZATION     no significant CAD  . CARDIAC DEFIBRILLATOR PLACEMENT  2006  . CHOLECYSTECTOMY    . IMPLANTABLE CARDIOVERTER DEFIBRILLATOR (ICD) GENERATOR CHANGE N/A 03/01/2011   Procedure: ICD GENERATOR CHANGE;  Surgeon: Deboraha Sprang, MD;  Location: [facility] CATH LAB;  Service: Cardiovascular;  Laterality: N/A;  . TOTAL ABDOMINAL HYSTERECTOMY  1983    Current Outpatient Prescriptions  Medication Sig Dispense Refill  . albuterol (PROVENTIL HFA;VENTOLIN HFA) 108 (90 Base) MCG/ACT inhaler Inhale 1 puff into the lungs every 6 (six) hours as needed for wheezing or shortness of breath.    [name] albuterol (PROVENTIL) (2.5 MG/3ML) 0.083% nebulizer solution Take 2.5 mg by nebulization every 6 (six) hours as needed for wheezing or shortness of breath.     [name] atorvastatin (LIPITOR) 40 MG tablet Take 40 mg by mouth daily at 6 PM.     . carvedilol (COREG) 25 MG tablet Take 25 mg by mouth 2 (two) times daily with a meal.      . furosemide (LASIX) 20 MG tablet Take 1 tablet (20 mg total) by mouth daily. 30 tablet 11  . lamoTRIgine (LAMICTAL) 25 MG tablet Take 1 tablet at night for 2 weeks, then increase to 1 tablet twice a day (  Patient taking differently: Take 25 mg by mouth 2 (two) times daily. ) 180 tablet 3  . levETIRAcetam (KEPPRA) 1000 MG tablet Take 1,500 mg by mouth 2 (two) times daily. Take 1.5 tablets by mouth twice daily     . LORazepam (ATIVAN) 1 MG tablet Take 2 mg by mouth every 8 (eight) hours as needed for seizure. Patient states she takes 2 tablets when needed.    . Multiple Vitamins-Calcium (ONE-A-DAY WOMENS PO) Take 1 tablet by mouth daily.     . nitroGLYCERIN (NITROSTAT) 0.4 MG SL tablet Place 0.4 mg under the tongue every 5 (five) minutes as needed for chest  pain.    [name] omeprazole (PRILOSEC) 40 MG capsule Take 40 mg by mouth 2 (two) times daily.     . potassium chloride (K-DUR) 10 MEQ tablet Take 20 mEq by mouth 2 (two) times daily. Take 2 tablets by mouth twice daily    . sacubitril-valsartan (ENTRESTO) 24-26 MG Take 1 tablet by mouth 2 (two) times daily.    . sitaGLIPtan (JANUVIA) 100 MG tablet Take 100 mg by mouth daily.       No current facility-administered medications for this visit.     Allergies  Allergen Reactions  . Codeine Other (See Comments)    "puts me on a trip"  . Gemfibrozil Diarrhea  . Lactose Intolerance (Gi) Diarrhea  . Morphine And Related Other (See Comments)  . Valium Hives and Rash    Review of Systems negative except from HPI and PMH  Physical Exam BP 90/60   Pulse 85   Ht 5\' 4"  (1.626 m)   Wt 141 lb (64 kg)   SpO2 98%   BMI 24.20 kg/m  Well developed and well nourished in no acute distress HENT traumatic E scleral and icterus clear Neck Supple JVP 8-10  carotids brisk and full Clear to ausculation Regular rate and rhythm,2/6 murmur Soft with active bowel sounds right upper quadrant tenderness  No clubbing cyanosis Trace Edema left lower leg swelling Alert and oriented,  having involuntary jerks on the left side Skin Warm and Dry  ECG demonstrates P. Synchronous pacing with a narrow QRS (134 ms) and left bundle branch pattern  ECG 2011 at paced  QRS duration 150 ms   Assessment and  Plan  Congestive heart failure-acute/chronic-systolic  Nonischemic cardiomyopathy interval normalization  Seizures  Lead failure  Inappropriate shocks  Asymmetric edema   Renal insufficiency  Gr 3  CRT D.-St. Jude's anterior location of the LV and a very narrow QRS on ECG    Her LV lead is off as noted above.  She's having more heart failure symptoms which she dates to her shock. There may have been some worsening of LV function. We'll obtain an echocardiogram. We will also have her increase her diuretics  for a week.  LVEF within for recommendations regarding what to do with her ICD lead although given the difficulties we had last time trying to access the subclavian vein it would be a little bit of a challenge. Perhaps it could be done under ultrasonography.  We spent more than 50% of our >25 min visit in face to face counseling regarding the above

## 2016-04-24 NOTE — Patient Instructions (Signed)
Medication Instructions: - Your physician has recommended you make the following change in your medication: 1) Increase lasix (furosemide) 20 mg- take 2 tablets (40 mg) by mouth once daily x 7 days, then resume your normal dose  Labwork: - none ordered  Procedures/Testing: - Your physician has requested that you have an echocardiogram. Echocardiography is a painless test that uses sound waves to create images of your heart. It provides your doctor with information about the size and shape of your heart and how well your heart's chambers and valves are working. This procedure takes approximately one hour. There are no restrictions for this procedure.  - Your physician has requested that you have a lower extremity venous duplex. This test is an ultrasound of the veins in the legs. It looks at venous blood flow that carries blood from the heart to the legs. Allow one hour for a Lower Venous exam. There are no restrictions or special instructions.  Follow-Up: - Your physician recommends that you schedule a follow-up appointment in: 3 months with Chanetta Marshall, NP for Dr. Caryl Comes.   Any Additional Special Instructions Will Be Listed Below (If Applicable).     If you need a refill on your cardiac medications before your next appointment, please call your pharmacy.

## 2016-04-25 LAB — CUP PACEART INCLINIC DEVICE CHECK
Implantable Lead Implant Date: 20060925
Implantable Lead Location: 753859
Implantable Lead Location: 753860
Implantable Lead Model: 1580
MDC IDC LEAD IMPLANT DT: 20060925
MDC IDC LEAD IMPLANT DT: 20060925
MDC IDC LEAD LOCATION: 753858
MDC IDC PG IMPLANT DT: 20130213
MDC IDC PG SERIAL: 7025184
MDC IDC SESS DTM: 20180410191430

## 2016-04-25 NOTE — Progress Notes (Signed)
ICM remote transmission rescheduled from 04/25/2016 to 05/04/2016 due to patient had defib office check with Dr Caryl Comes 04/24/2016.

## 2016-04-28 ENCOUNTER — Telehealth: Payer: Self-pay | Admitting: Cardiology

## 2016-04-28 NOTE — Telephone Encounter (Signed)
Spoke w/ pt b/c her home monitor has not updated since 04-15-16. Pt stated that he device was turned off b/c she received ICD therapies and it done damage to her heart. I informed her that the therapy part on the device was turned off but the PPM part of the device is still working. Pt stated that she will plug her monitor back in and send a transmission on Saturday. She will talk w/ MD about unplugging monitor at her 3 month follow up.

## 2016-05-04 ENCOUNTER — Ambulatory Visit (INDEPENDENT_AMBULATORY_CARE_PROVIDER_SITE_OTHER): Payer: Medicare HMO

## 2016-05-04 DIAGNOSIS — I5022 Chronic systolic (congestive) heart failure: Secondary | ICD-10-CM

## 2016-05-04 DIAGNOSIS — Z95810 Presence of automatic (implantable) cardiac defibrillator: Secondary | ICD-10-CM | POA: Diagnosis not present

## 2016-05-04 NOTE — Progress Notes (Signed)
EPIC Encounter for ICM Monitoring  Patient Name: [name] is a 76 y.o. female Date: 05/04/2016 Primary Care Physican: Bonnita Nasuti, MD Primary [place] Electrophysiologist: Faustino Congress Weight:unknown Battery Longevity: 8.4 months  Heart Failure questions reviewed, pt asymptomatic.   Thoracic impedance normal   Prescribed dosage: Furosemide 20 mg 1 tablet daily.  Recommendations: No changes. Discussed to limit salt intake to 2000 mg/day and fluid intake to < 2 liters/day.  Encouraged to call for fluid symptoms.  Follow-up plan: ICM clinic phone appointment on 06/06/2016.    Copy of ICM check sent to device physician.   3 month ICM trend: 05/04/2016   1 Year ICM trend:      Rosalene Billings, RN 05/04/2016 9:56 AM

## 2016-05-10 ENCOUNTER — Ambulatory Visit (HOSPITAL_COMMUNITY)
Admission: RE | Admit: 2016-05-10 | Discharge: 2016-05-10 | Disposition: A | Discharge status: Home / Self Care | Payer: Medicare HMO | Source: Ambulatory Visit | Attending: Cardiovascular Disease | Admitting: Cardiovascular Disease

## 2016-05-10 ENCOUNTER — Ambulatory Visit (HOSPITAL_COMMUNITY): Payer: Medicare HMO | Attending: Cardiology

## 2016-05-10 ENCOUNTER — Other Ambulatory Visit: Payer: Self-pay

## 2016-05-10 DIAGNOSIS — R29898 Other symptoms and signs involving the musculoskeletal system: Secondary | ICD-10-CM | POA: Diagnosis not present

## 2016-05-10 DIAGNOSIS — E119 Type 2 diabetes mellitus without complications: Secondary | ICD-10-CM | POA: Insufficient documentation

## 2016-05-10 DIAGNOSIS — M7989 Other specified soft tissue disorders: Secondary | ICD-10-CM | POA: Diagnosis present

## 2016-05-10 DIAGNOSIS — I361 Nonrheumatic tricuspid (valve) insufficiency: Secondary | ICD-10-CM | POA: Diagnosis not present

## 2016-05-10 DIAGNOSIS — I10 Essential (primary) hypertension: Secondary | ICD-10-CM | POA: Diagnosis not present

## 2016-05-10 DIAGNOSIS — I5022 Chronic systolic (congestive) heart failure: Secondary | ICD-10-CM

## 2016-05-10 DIAGNOSIS — I313 Pericardial effusion (noninflammatory): Secondary | ICD-10-CM | POA: Insufficient documentation

## 2016-05-10 DIAGNOSIS — I348 Other nonrheumatic mitral valve disorders: Secondary | ICD-10-CM | POA: Diagnosis not present

## 2016-05-10 DIAGNOSIS — Z87891 Personal history of nicotine dependence: Secondary | ICD-10-CM | POA: Insufficient documentation

## 2016-05-10 DIAGNOSIS — E784 Other hyperlipidemia: Secondary | ICD-10-CM | POA: Diagnosis not present

## 2016-05-18 ENCOUNTER — Telehealth: Payer: Self-pay

## 2016-05-18 NOTE — Telephone Encounter (Signed)
Called to go over echo results. Patient has a voicemail that has not been set up so unable to leave a message. Will attempt to call later.

## 2016-05-18 NOTE — Telephone Encounter (Signed)
Pt is aware and agreeable to normal venous doppler results and stable abnormality of her echo. Patients states she can feel that her heart is getting easier because she is growing more increasingly fatigued and is having some dyspnea with exertion. She states she can take a shower and feel "just about wore out". She can barely walk her dogs without having to stop to take several breaks to catch her breath. She states that she is still working 2 days a week and that is taking everything out of her.

## 2016-05-26 ENCOUNTER — Encounter: Payer: Self-pay | Admitting: Neurology

## 2016-05-26 ENCOUNTER — Ambulatory Visit (INDEPENDENT_AMBULATORY_CARE_PROVIDER_SITE_OTHER): Payer: Medicare HMO | Admitting: Neurology

## 2016-05-26 VITALS — BP 104/58 | HR 74 | Ht 64.0 in | Wt 142.0 lb

## 2016-05-26 DIAGNOSIS — G40109 Localization-related (focal) (partial) symptomatic epilepsy and epileptic syndromes with simple partial seizures, not intractable, without status epilepticus: Secondary | ICD-10-CM | POA: Diagnosis not present

## 2016-05-26 DIAGNOSIS — Z8679 Personal history of other diseases of the circulatory system: Secondary | ICD-10-CM | POA: Diagnosis not present

## 2016-05-26 NOTE — Progress Notes (Signed)
NEUROLOGY FOLLOW UP OFFICE NOTE  [name] [id number]  HISTORY OF PRESENT ILLNESS: I had the pleasure of seeing [name] in follow-up in the neurology clinic on 05/26/2016.  The patient was last seen 3 months ago for seizures. She has a history of craniotomy for right epidural hematoma. 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 She continued to report simple partial seizures on Keppra 1500mg  BID, we added on low dose Lamotrigine 25mg  BID on her last visit. She reports that since her last visit, she only had one episode of left foot numbness on 04/23/16, it took 1.5 hours to get over it. She feels this was due to her defibrillator. 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 She had felt weak since then but reports this is slowly improving. She has some easy fatigability, she feels her legs will give out on her if she exerts herself. She feels off balance and occasionally uses her cane. No falls. She denies any staring/unresponsive episodes, olfactory/gustatory hallucinations, myoclonic jerks, dizziness, vision changes/diplopia, no rash. She does not drive.   HPI: This is a 76 yo RH woman with a history of cardiomyopathy s/p ICD placement, hypertension, hyperlipidemia, diabetes, paroxysmal atrial fibrillation, who had a fall in December 2012, found to have multiple right extraaxial intracranial hemorrhages and INR >20 while on Coumadin at that time. She had a witnessed seizure in [place] then transferred to [facility] where she had a right craniotomy of evacuation of epidural hematoma. She had another seizure after the surgery. She was discharged home on Keppra. She had an admission in May 2013 for recurrent seizures, there is note that she was being weaned off seizure medication, then had a seizure and Keppra was restarted. She was reported to have twitching. I  personally reviewed head CT done at that time which showed right frontal craniotomy, no residual fluid collection. Remote cortical infarcts/contusions in the right and left occipital regions. She denies any further seizures until 05/24/14 when she started having uncontrollable twitching on the left side of her face and left arm per ER notes. She tells me today that her whole body was shaking. She was awake and alert, able to communicate, denies confusion. She denied focal weakness after. She was given IV Ativan with resolution of twitching. She had a head CT (images unavailable for review) which showed post-surgical right frontal craniotomy changes, chronic right PCA infarct, no acute abnormalities. She had an EEG reported as normal awake and drowsy EEG. She saw her PCP and had an elevated Keppra level on Keppra 750mg  BID, dose was reduced to 500mg  BID. She denies any further episodes of left-sided twitching. She has a prescription for prn Ativan.   Epilepsy Risk Factors: Right-sided intracranial hemorrhage in 2012 s/p craniotomy. Otherwise she had a normal birth and early development. There is no history of febrile convulsions, CNS infections such as meningitis/encephalitis, or family history of seizures.  PAST MEDICAL HISTORY: Past Medical History:  Diagnosis Date  . 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  s/p revision 2/2 ERI 02/28/2011 - [facility] CD 3257  . Chronic systolic heart failure ([place])       . COPD (chronic obstructive pulmonary disease) ([place])    a. pt reports "scarring" at the lungs due to double PNA in the past.  . DM2 (diabetes mellitus, type 2) ([place])   .  HLD (hyperlipidemia)   . Hypertension   . Hypokalemia   . ICD (implantable cardioverter-defibrillator) malfunction 03/22/2016  . LBBB (left bundle branch block)   . NICM (nonischemic cardiomyopathy) ([place])    a. Initial EF of 35%. b. Normal in 07/2009. c. 35% in 2012, 30% in 12/2011. Reported h/o  catheterization done presumably before her ICD implanted at [facility] demonstrated some years ago no obstructive coronary disease. Neg nuc 12/2011.  [name] PAF (paroxysmal atrial fibrillation) ([place])    a. Not on anticoag due to h/o SDH.  . Seizures ([place])   . Subdural hematoma ([place])    a. 12/12. Hospitalized at [facility], requiring craniotomy, complicated by seizures.  . Vitamin D deficiency     MEDICATIONS: Current Outpatient Prescriptions on File Prior to Visit  Medication Sig Dispense Refill  . albuterol (PROVENTIL HFA;VENTOLIN HFA) 108 (90 Base) MCG/ACT inhaler Inhale 1 puff into the lungs every 6 (six) hours as needed for wheezing or shortness of breath.    [name] albuterol (PROVENTIL) (2.5 MG/3ML) 0.083% nebulizer solution Take 2.5 mg by nebulization every 6 (six) hours as needed for wheezing or shortness of breath.     [name] atorvastatin (LIPITOR) 40 MG tablet Take 40 mg by mouth daily at 6 PM.     . carvedilol (COREG) 25 MG tablet Take 25 mg by mouth 2 (two) times daily with a meal.      . furosemide (LASIX) 20 MG tablet Take 1 tablet (20 mg total) by mouth daily. 30 tablet 11  . lamoTRIgine (LAMICTAL) 25 MG tablet Take 1 tablet at night for 2 weeks, then increase to 1 tablet twice a day (Patient taking differently: Take 25 mg by mouth 2 (two) times daily. ) 180 tablet 3  . levETIRAcetam (KEPPRA) 1000 MG tablet Take 1,500 mg by mouth 2 (two) times daily. Take 1.5 tablets by mouth twice daily     . LORazepam (ATIVAN) 1 MG tablet Take 2 mg by mouth every 8 (eight) hours as needed for seizure. Patient states she takes 2 tablets when needed.    . Multiple Vitamins-Calcium (ONE-A-DAY WOMENS PO) Take 1 tablet by mouth daily.     . nitroGLYCERIN (NITROSTAT) 0.4 MG SL tablet Place 0.4 mg under the tongue every 5 (five) minutes as needed for chest pain.    [name] omeprazole (PRILOSEC) 40 MG capsule Take 40 mg by mouth 2 (two) times daily.     . potassium chloride (K-DUR) 10 MEQ tablet Take 20 mEq by mouth 2 (two)  times daily. Take 2 tablets by mouth twice daily    . sacubitril-valsartan (ENTRESTO) 24-26 MG Take 1 tablet by mouth 2 (two) times daily.    . sitaGLIPtan (JANUVIA) 100 MG tablet Take 100 mg by mouth daily.       No current facility-administered medications on file prior to visit.     ALLERGIES: Allergies  Allergen Reactions  . Codeine Other (See Comments)    "puts me on a trip"  . Gemfibrozil Diarrhea  . Lactose Intolerance (Gi) Diarrhea  . Morphine And Related Other (See Comments)  . Valium Hives and Rash    FAMILY HISTORY: Family History  Problem Relation Age of Onset  . Heart disease Mother   . Heart disease Father   . Heart disease Unknown     SOCIAL HISTORY: Social History   Social History  . Marital status: Married    Spouse name: N/A  . Number of children: 5  . Years of education: N/A  Occupational History  . retired    Social History Main Topics  . Smoking status: Former Smoker    Types: Cigarettes    Quit date: 01/17/1968  . Smokeless tobacco: Never Used  . Alcohol use No  . Drug use: No  . Sexual activity: Not on file   Other Topics Concern  . Not on file   Social History Narrative   Registration notes - ICD = St. Jude    REVIEW OF SYSTEMS: Constitutional: No fevers, chills, or sweats, + generalized fatigue, change in appetite Eyes: No visual changes, double vision, eye pain Ear, nose and throat: No hearing loss, ear pain, nasal congestion, sore throat Cardiovascular: No chest pain, palpitations Respiratory:  No shortness of breath at rest or with exertion, wheezes GastrointestinaI: No nausea, vomiting, diarrhea, abdominal pain, fecal incontinence Genitourinary:  No dysuria, urinary retention or frequency Musculoskeletal:  No neck pain,+ back pain Integumentary: No rash, pruritus, skin lesions Neurological: as above Psychiatric: No depression, insomnia, anxiety Endocrine: No palpitations, fatigue, diaphoresis, mood swings, change in  appetite, change in weight, increased thirst Hematologic/Lymphatic:  No anemia, purpura, petechiae. Allergic/Immunologic: no itchy/runny eyes, nasal congestion, recent allergic reactions, rashes  PHYSICAL EXAM: Vitals:   05/26/16 0845  BP: (!) 104/58  Pulse: 74   General: No acute distress Head:  Normocephalic/atraumatic Neck: supple, no paraspinal tenderness, full range of motion Heart:  Regular rate and rhythm Lungs:  Clear to auscultation bilaterally Back: No paraspinal tenderness Skin/Extremities: No rash, no edema Neurological Exam: alert and oriented to person, place, and time. No aphasia or dysarthria. Fund of knowledge is appropriate.  Recent and remote memory are intact.  Attention and concentration are normal.    Able to name objects and repeat phrases. Cranial nerves: Pupils equal, round, reactive to light.  Extraocular movements intact with no nystagmus. Visual fields full. Facial sensation intact. No facial asymmetry. Tongue, uvula, palate midline.  Motor: Bulk and tone normal, muscle strength 5/5 throughout with no pronator drift.  Sensation to light touch intact.  No extinction to double simultaneous stimulation.  Deep tendon reflexes 2+ throughout, toes downgoing.  Finger to nose testing intact.  Gait narrow-based and steady, mild difficulty with tandem walk but able (similar to prior). Romberg negative.  IMPRESSION: This is a 76 yo RH woman with a history of hypertension, hyperlipidemia, diabetes, cardiomyopathy s/p ICD placement, paroxysmal atrial fibrillation off anticoagulation due to intracranial bleed in 2012 with subsequent seizures. She had been seizure-free for 3 years until she had a breakthrough simple partial seizure with left-sided twitching in May 2016. EEG normal, head CT no acute changes. She denies any further twitching, but continues to have simple partial sensory seizures with numbness that travels up her left side. Low dose Lamotrigine 25mg  BID was added on to  Keppra 1500mg  BID, she reports once episode in the past 3 months. Continue on current medications, continue seizure calendar. She has prn Ativan to take for seizure. She is aware of [place] driving laws to stop driving after a seizure, until 6 months seizure-free. She will follow-up in 6 months and knows to call for any changes.   Thank you for allowing me to participate in her care.  Please do not hesitate to call for any questions or concerns.  The duration of this appointment visit was 15 minutes of face-to-face time with the patient.  Greater than 50% of this time was spent in counseling, explanation of diagnosis, planning of further management, and coordination of care.   Ellouise Newer,  M.D.   CC: Dr. Jannette Fogo, Dr. Caryl Comes

## 2016-05-26 NOTE — Patient Instructions (Signed)
1. Continue Keppra 1500mg  twice a day 2. Continue Lamotrigine 25mg  twice a day 3. Use walker or cane for balance 4. Continue seizure calendar, follow-up in 6 months, call for any changes  Seizure Precautions: 1. If medication has been prescribed for you to prevent seizures, take it exactly as directed.  Do not stop taking the medicine without talking to your doctor first, even if you have not had a seizure in a long time.   2. Avoid activities in which a seizure would cause danger to yourself or to others.  Don't operate dangerous machinery, swim alone, or climb in high or dangerous places, such as on ladders, roofs, or girders.  Do not drive unless your doctor says you may.  3. If you have any warning that you may have a seizure, lay down in a safe place where you can't hurt yourself.    4.  No driving for 6 months from last seizure, as per [facility].   Please refer to the following link on the [place] website for more information: http://[email or web address]/[email or web address]/[email or web address]   5.  Maintain good sleep hygiene. Avoid alcohol.  6.  Contact your doctor if you have any problems that may be related to the medicine you are taking.  7.  Call 911 and bring the patient back to the ED if:        A.  The seizure lasts longer than 5 minutes.       B.  The patient doesn't awaken shortly after the seizure  C.  The patient has new problems such as difficulty seeing, speaking or moving  D.  The patient was injured during the seizure  E.  The patient has a temperature over 102 F (39C)  F.  The patient vomited and now is having trouble breathing

## 2016-06-06 ENCOUNTER — Ambulatory Visit (INDEPENDENT_AMBULATORY_CARE_PROVIDER_SITE_OTHER): Payer: Medicare HMO

## 2016-06-06 DIAGNOSIS — Z95810 Presence of automatic (implantable) cardiac defibrillator: Secondary | ICD-10-CM

## 2016-06-06 DIAGNOSIS — I5022 Chronic systolic (congestive) heart failure: Secondary | ICD-10-CM

## 2016-06-06 NOTE — Progress Notes (Signed)
EPIC Encounter for ICM Monitoring  Patient Name: [name] is a 76 y.o. female Date: 06/06/2016 Primary Care Physican: [name], MD Primary [place] Electrophysiologist: Faustino Congress Weight:unknown Battery Longevity: 8.4 months      Heart Failure questions reviewed, pt chronic leg swelling but no worse.   Thoracic impedance almost at baseline today but has been abnormal suggesting fluid accumulation from 05/28/2016 and almost at baseline today.  Prescribed dosage: Furosemide 20 mg 1 tablet daily.  Labs: 03/23/2016 Creatinine 1.23, BUN 22, Potassium 3.9, Sodium 141 03/22/2016 Creatinine 1.22  08/17/2015 Creatinine 1.10, BUN 14, Potassium 4.2, Sodium 141   Recommendations: No changes. Advised to limit salt intake to 2000 mg/day and fluid intake to < 2 liters/day.  Encouraged to call for fluid symptoms or use local ER for any urgent symptoms.  Follow-up plan: ICM clinic phone appointment on 06/15/2016 to recheck fluid levels.  Office appointment scheduled on 07/26/2016 with Chanetta Marshall, NP.  Copy of ICM check sent to primary cardiologist and device physician.   3 month ICM trend: 06/06/2016   1 Year ICM trend:      Rosalene Billings, RN 06/06/2016 9:12 AM

## 2016-06-15 ENCOUNTER — Ambulatory Visit (INDEPENDENT_AMBULATORY_CARE_PROVIDER_SITE_OTHER): Payer: Medicare HMO

## 2016-06-15 DIAGNOSIS — I5022 Chronic systolic (congestive) heart failure: Secondary | ICD-10-CM

## 2016-06-15 DIAGNOSIS — Z95810 Presence of automatic (implantable) cardiac defibrillator: Secondary | ICD-10-CM

## 2016-06-16 NOTE — Progress Notes (Signed)
EPIC Encounter for ICM Monitoring  Patient Name: [name] is a 76 y.o. female Date: 06/16/2016 Primary Care Physican: [name], MD Primary [place] Electrophysiologist: Faustino Congress Weight:unknown Battery Longevity: 8.6 months                                                  Heart Failure questions reviewed, pt reported she is feeling fine.  She continues to have chronic leg swelling but it is not worse than usual   Thoracic impedance returned to baseline and is normal  Prescribed dosage: Furosemide 20 mg 1 tablet daily.  Labs: 03/23/2016 Creatinine 1.23, BUN 22, Potassium 3.9, Sodium 141 03/22/2016 Creatinine 1.22  08/17/2015 Creatinine 1.10, BUN 14, Potassium 4.2, Sodium 141   Recommendations: No changes. Discussed to limit salt intake to 2000 mg/day and fluid intake to < 2 liters/day.  Encouraged to call for fluid symptoms or use local ER for any urgent symptoms.  Follow-up plan: ICM clinic phone appointment on 08/29/2016.  Office appointment scheduled on 07/26/2016 with Chanetta Marshall, NP.  Copy of ICM check sent to device physician.   3 month ICM trend: 06/15/2016   1 Year ICM trend:      Rosalene Billings, RN 06/16/2016 8:25 AM

## 2016-07-05 ENCOUNTER — Other Ambulatory Visit: Payer: Self-pay | Admitting: Neurology

## 2016-07-07 ENCOUNTER — Encounter: Payer: Self-pay | Admitting: Nurse Practitioner

## 2016-07-25 NOTE — Progress Notes (Signed)
Electrophysiology Office Note Date: 07/26/2016  ID:  [name], [name] [birth year], MRN [id number]  PCP: [name] Number, MD Electrophysiologist: [name]  CC: Routine ICD follow-up  [name] is a 76 y.o. female seen today for Dr [name].  She presents today for routine electrophysiology followup. She originally underwent CRTD implant at [facility] for NICM.  She subsequently had inappropriate shocks 2/2 externalization of Riata lead and tachy therapies were disabled.  At last office visit, she was having incrsed HF symptoms.  Repeat echo demonstrated relatively stable EF (40-45%) and she was scheduled for follow up today. Since last being seen in our clinic, the patient reports a consistent decline in functional status and increase in shortness of breath. She was seen by her PCP last week who decreased Entresto and stopped Lasix 2/2 hypotension. She has also been started on O2 at home which she thinks is helping her shortness of breath.  She has not noticed improvement with med changes. She denies chest pain, palpitations, PND, orthopnea, nausea, vomiting, dizziness, syncope, edema, weight gain, or early satiety.  She has not had ICD shocks.   Device History: STJ CRTD implanted 2006 for NICM; gen change 2013; inappropriate therapy 2/2 lead noise (Riata) 03/2016 ->tachy therapies disabled. LV lead turned off 08/2015 2/2 normalization of LV function and narrower QRS without CRT pacing History of appropriate therapy: No History of AAD therapy: No   Past Medical History:  Diagnosis Date  . 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  s/p revision 2/2 ERI 02/28/2011 - [facility] CD 3257  . Chronic systolic heart failure ([place])       . COPD (chronic obstructive pulmonary disease) ([place])    a. pt reports "scarring" at the lungs due to double PNA in the past.  . DM2 (diabetes mellitus, type 2) ([place])   . HLD (hyperlipidemia)   . Hypertension   . Hypokalemia   . ICD (implantable  cardioverter-defibrillator) malfunction 03/22/2016  . LBBB (left bundle branch block)   . NICM (nonischemic cardiomyopathy) ([place])    a. Initial EF of 35%. b. Normal in 07/2009. c. 35% in 2012, 30% in 12/2011. Reported h/o catheterization done presumably before her ICD implanted at [facility] demonstrated some years ago no obstructive coronary disease. Neg nuc 12/2011.  [name] PAF (paroxysmal atrial fibrillation) ([place])    a. Not on anticoag due to h/o SDH.  . Seizures ([place])   . Subdural hematoma ([place])    a. 12/12. Hospitalized at [facility], requiring craniotomy, complicated by seizures.  . Vitamin D deficiency    Past Surgical History:  Procedure Laterality Date  . BRAIN HEMATOMA EVACUATION    . CARDIAC CATHETERIZATION     no significant CAD  . CARDIAC DEFIBRILLATOR PLACEMENT  2006  . CHOLECYSTECTOMY    . IMPLANTABLE CARDIOVERTER DEFIBRILLATOR (ICD) GENERATOR CHANGE N/A 03/01/2011   Procedure: ICD GENERATOR CHANGE;  Surgeon: Deboraha Sprang, MD;  Location: [facility] CATH LAB;  Service: Cardiovascular;  Laterality: N/A;  . TOTAL ABDOMINAL HYSTERECTOMY  1983    Current Outpatient Prescriptions  Medication Sig Dispense Refill  . albuterol (PROVENTIL HFA;VENTOLIN HFA) 108 (90 Base) MCG/ACT inhaler Inhale 1 puff into the lungs every 6 (six) hours as needed for wheezing or shortness of breath.    [name] albuterol (PROVENTIL) (2.5 MG/3ML) 0.083% nebulizer solution Take 2.5 mg by nebulization every 6 (six) hours as needed for wheezing or shortness of breath.     [name] atorvastatin (LIPITOR) 40 MG tablet Take 40 mg  by mouth daily at 6 PM.     . carvedilol (COREG) 25 MG tablet Take 25 mg by mouth 2 (two) times daily with a meal.      . lamoTRIgine (LAMICTAL) 25 MG tablet Take 1 tablet at night for 2 weeks, then increase to 1 tablet twice a day 180 tablet 3  . levETIRAcetam (KEPPRA) 1000 MG tablet TAKE 1 AND 1/2 TABLETS TWICE DAILY 270 tablet 3  . LORazepam (ATIVAN) 1 MG tablet Take 2 mg by mouth every 8 (eight) hours as  needed for seizure. Patient states she takes 2 tablets when needed.    . Multiple Vitamins-Calcium (ONE-A-DAY WOMENS PO) Take 1 tablet by mouth daily.     . nitroGLYCERIN (NITROSTAT) 0.4 MG SL tablet Place 0.4 mg under the tongue every 5 (five) minutes as needed for chest pain (MAX 3 TABLETS).     [name] omeprazole (PRILOSEC) 40 MG capsule Take 40 mg by mouth 2 (two) times daily.     . potassium chloride (K-DUR) 10 MEQ tablet Take 20 mEq by mouth 2 (two) times daily. Take 2 tablets by mouth twice daily    . sacubitril-valsartan (ENTRESTO) 49-51 MG Take 0.5 tablets by mouth daily.    . sitaGLIPtan (JANUVIA) 100 MG tablet Take 100 mg by mouth daily.       No current facility-administered medications for this visit.     Allergies:   Codeine; Gemfibrozil; Lactose intolerance (gi); Morphine and related; and Valium   Social History: Social History   Social History  . Marital status: Married    Spouse name: N/A  . [name] of children: 5  . Years of education: N/A   Occupational History  . retired    Social History Main Topics  . Smoking status: Former Smoker    Types: Cigarettes    Quit date: 01/17/1968  . Smokeless tobacco: Never Used  . Alcohol use No  . Drug use: No  . Sexual activity: Not on file   Other Topics Concern  . Not on file   Social History Narrative   Registration notes - ICD = [employer]. Jude    Family History: Family History  Problem Relation Age of Onset  . Heart disease Mother   . Heart disease Father   . Heart disease Unknown     Review of Systems: All other systems reviewed and are otherwise negative except as noted above.   Physical Exam: VS:  BP (!) 100/44   Pulse 86   Ht 5\' 4"  (1.626 m)   Wt 142 lb 3.2 oz (64.5 kg)   SpO2 95%   BMI 24.41 kg/m  , BMI Body mass index is 24.41 kg/m.  GEN- The patient is elderly and chronically ill appearing, alert and oriented x 3 today, on O2.   HEENT: normocephalic, atraumatic; sclera clear, conjunctiva pink; hearing  intact; oropharynx clear; neck supple  Lungs- Clear to ausculation bilaterally, slightly increased work of breathing, +expiratory wheezing Heart- Regular rate and rhythm  GI- soft, non-tender, non-distended, bowel sounds present  Extremities- no clubbing, cyanosis, or edema  MS- no significant deformity or atrophy Skin- warm and dry, no rash or lesion; ICD pocket well healed Psych- euthymic mood, full affect Neuro- strength and sensation are intact  ICD interrogation- reviewed in detail today,  See PACEART report  EKG:  EKG is not ordered today.  Recent Labs: 03/22/2016: Hemoglobin 12.1; Platelets 120 03/23/2016: BUN 22; Creatinine, Ser 1.23; Potassium 3.9; Sodium 141   Wt Readings from Last  3 Encounters:  07/26/16 142 lb 3.2 oz (64.5 kg)  05/26/16 142 lb (64.4 kg)  04/24/16 141 lb (64 kg)     Other studies Reviewed: Additional studies/ records that were reviewed today include: Dr Olin Pia office notes  Assessment and Plan:  1.  Chronic systolic dysfunction euvolemic today Stable on an appropriate medical regimen Normal ICD function See Pace Art report No changes today Suspect shortness of breath is more related to COPD at this time LV lead is off 2/2 prior normalization of LV function and narrow QRS at baseline With inappropriate shocks for lead noise, tachy therapies are currently disabled. As she does not have a pacing indication, I would be inclined to not change her device out at [facility]. She has left sided subclavian occlusion  2.  Paroxysmal atrial fibrillation Burden by device interrogation 0% She has prior SDH and is off [place] CHADS2VASC is 6  3.  HTN Stable No change required today  4. Shortness of breath Likely multifactorial, but I suspect primarily lung driven at this time I have offered referral to pulmonary, but she declines at this time Will update BMET and BNP today    Current medicines are reviewed at length with the patient today.   The patient does not  have concerns regarding her medicines.  The following changes were made today:  none  Labs/ tests ordered today include:  Orders Placed This Encounter  Procedures  . Basic Metabolic Panel (BMET)  . Pro b natriuretic peptide  . CUP PACEART INCLINIC DEVICE CHECK     Disposition:   Follow up with Dr [name] 6 months, [place]      Signed, Chanetta Marshall, NP 07/26/2016 9:36 AM  [place] [address] [phone] (office) [phone] (fax)

## 2016-07-26 ENCOUNTER — Encounter: Payer: Self-pay | Admitting: Nurse Practitioner

## 2016-07-26 ENCOUNTER — Telehealth: Payer: Self-pay | Admitting: Nurse Practitioner

## 2016-07-26 ENCOUNTER — Ambulatory Visit (INDEPENDENT_AMBULATORY_CARE_PROVIDER_SITE_OTHER): Payer: Medicare HMO | Admitting: Nurse Practitioner

## 2016-07-26 VITALS — BP 100/44 | HR 86 | Ht 64.0 in | Wt 142.2 lb

## 2016-07-26 DIAGNOSIS — I10 Essential (primary) hypertension: Secondary | ICD-10-CM | POA: Diagnosis not present

## 2016-07-26 DIAGNOSIS — I5022 Chronic systolic (congestive) heart failure: Secondary | ICD-10-CM

## 2016-07-26 DIAGNOSIS — I480 Paroxysmal atrial fibrillation: Secondary | ICD-10-CM

## 2016-07-26 LAB — CUP PACEART INCLINIC DEVICE CHECK
Implantable Lead Implant Date: 20060925
Implantable Lead Implant Date: 20060925
Implantable Lead Location: 753859
Implantable Lead Location: 753860
Implantable Pulse Generator Implant Date: 20130213
MDC IDC LEAD IMPLANT DT: 20060925
MDC IDC LEAD LOCATION: 753858
MDC IDC PG SERIAL: 7025184
MDC IDC SESS DTM: 20180711092831

## 2016-07-26 NOTE — Telephone Encounter (Signed)
ROI faxed to [place] Haque,MD at [phone]

## 2016-07-26 NOTE — Patient Instructions (Signed)
Medication Instructions:  Your physician recommends that you continue on your current medications as directed. Please refer to the Current Medication list given to you today.   Labwork: Your physician recommends that you return for lab work today for BMET, PRO BNP   Testing/Procedures: None Ordered   Follow-Up: Your physician wants you to follow-up in: 6 months with Dr. Caryl Comes. You will receive a reminder letter in the mail two months in advance. If you don't receive a letter, please call our office to schedule the follow-up appointment.   Any Other Special Instructions Will Be Listed Below (If Applicable).     If you need a refill on your cardiac medications before your next appointment, please call your pharmacy.

## 2016-07-28 LAB — BASIC METABOLIC PANEL
BUN / CREAT RATIO: 11 — AB (ref 12–28)
BUN: 10 mg/dL (ref 8–27)
CO2: 19 mmol/L — ABNORMAL LOW (ref 20–29)
CREATININE: 0.94 mg/dL (ref 0.57–1.00)
Calcium: 8.3 mg/dL — ABNORMAL LOW (ref 8.7–10.3)
Chloride: 109 mmol/L — ABNORMAL HIGH (ref 96–106)
GFR, EST AFRICAN AMERICAN: 68 mL/min/{1.73_m2} (ref >59)
GFR, EST NON AFRICAN AMERICAN: 59 mL/min/{1.73_m2} — AB (ref >59)
Glucose: 100 mg/dL — ABNORMAL HIGH (ref 65–99)
Potassium: 4.5 mmol/L (ref 3.5–5.2)
SODIUM: 142 mmol/L (ref 134–144)

## 2016-07-28 LAB — PRO B NATRIURETIC PEPTIDE: NT-Pro BNP: 286 pg/mL (ref 0–738)

## 2016-08-02 ENCOUNTER — Telehealth: Payer: Self-pay | Admitting: Cardiology

## 2016-08-02 NOTE — Telephone Encounter (Signed)
Spoke w/ pt and requested that she send a manual transmission b/c her home monitor has not updated in at least 7 days.   

## 2016-08-03 ENCOUNTER — Telehealth: Payer: Self-pay | Admitting: Nurse Practitioner

## 2016-08-03 NOTE — Telephone Encounter (Signed)
Release faxed to [facility].

## 2016-08-14 ENCOUNTER — Telehealth: Payer: Self-pay | Admitting: Internal Medicine

## 2016-08-14 NOTE — Telephone Encounter (Signed)
Records received from [facility] placed In Chart Prep

## 2016-08-29 ENCOUNTER — Ambulatory Visit (INDEPENDENT_AMBULATORY_CARE_PROVIDER_SITE_OTHER): Payer: Medicare HMO

## 2016-08-29 DIAGNOSIS — I5022 Chronic systolic (congestive) heart failure: Secondary | ICD-10-CM | POA: Diagnosis not present

## 2016-08-29 DIAGNOSIS — Z95810 Presence of automatic (implantable) cardiac defibrillator: Secondary | ICD-10-CM

## 2016-08-29 NOTE — Progress Notes (Signed)
EPIC Encounter for ICM Monitoring  Patient Name: [name] is a 76 y.o. female Date: 08/29/2016 Primary Care Physican: [name], MD Primary [place] Electrophysiologist: Faustino Congress Weight:unknown Battery Longevity: 6.4 months       Heart Failure questions reviewed, pt asymptomatic.   Thoracic impedance abnormal suggesting fluid accumulation but very close to baseline.  Prescribed dosage: Furosemide 20 mg 1 tablet daily.  Labs: 07/26/2016 Creatinine 0.94, BUN 11, Potassium 4.5, Sodium 142, EGFR 59-68 03/23/2016 Creatinine 1.23, BUN 22, Potassium 3.9, Sodium 141 03/22/2016 Creatinine 1.22  08/01/2017Creatinine 1.10, BUN 14, Potassium 4.2, Sodium 141   Recommendations: No changes.  Advised to limit salt intake to 2000 mg/day and fluid intake to < 2 liters/day.  Encouraged to call for fluid symptoms.  Follow-up plan: ICM clinic phone appointment on 09/29/2016.   Copy of ICM check sent to device physician.   3 month ICM trend: 08/29/2016   Direct Trend Viewer     1 Year ICM trend:      Rosalene Billings, RN 08/29/2016 8:01 AM

## 2016-09-29 NOTE — Progress Notes (Signed)
No ICM remote transmission received for 09/29/2016 and next ICM transmission scheduled for 10/26/2016.

## 2016-10-02 ENCOUNTER — Telehealth: Payer: Self-pay | Admitting: Cardiology

## 2016-10-02 NOTE — Telephone Encounter (Signed)
Spoke w/ pt and requested that she send a manual transmission b/c her home monitor has not updated in at least 7 days.   

## 2016-10-26 ENCOUNTER — Ambulatory Visit (INDEPENDENT_AMBULATORY_CARE_PROVIDER_SITE_OTHER): Payer: Medicare HMO | Admitting: *Deleted

## 2016-10-26 DIAGNOSIS — I5022 Chronic systolic (congestive) heart failure: Secondary | ICD-10-CM

## 2016-10-26 DIAGNOSIS — I428 Other cardiomyopathies: Secondary | ICD-10-CM

## 2016-10-26 DIAGNOSIS — Z95810 Presence of automatic (implantable) cardiac defibrillator: Secondary | ICD-10-CM | POA: Diagnosis not present

## 2016-10-26 NOTE — Progress Notes (Signed)
Remote ICD transmission.   

## 2016-10-27 ENCOUNTER — Encounter: Payer: Self-pay | Admitting: Cardiology

## 2016-10-27 NOTE — Progress Notes (Signed)
EPIC Encounter for ICM Monitoring  Patient Name: [name] is a 76 y.o. female Date: 10/27/2016 Primary Care Physican: [name], MD Primary [place] Electrophysiologist: Faustino Congress Weight:unknown Battery Longevity: 3 months       Heart Failure questions reviewed, pt asymptomatic.  She is limited on her activities.  Her husband has Alzheimer's and was told today that he has cancer.  He will be taking treatment. She said her son and daughter in law are with her and help out.    Thoracic impedance normal.  Prescribed by Dr Jannette Fogo: Furosemide 20 mg 1 tablet daily  Labs: 07/26/2016 Creatinine 0.94, BUN 11, Potassium 4.5, Sodium 142, EGFR 59-68 03/23/2016 Creatinine 1.23, BUN 22, Potassium 3.9, Sodium 141 03/22/2016 Creatinine 1.22  08/01/2017Creatinine 1.10, BUN 14, Potassium 4.2, Sodium 141   Recommendations: No changes.   Encouraged to call for fluid symptoms.  Follow-up plan: ICM clinic phone appointment on 11/27/2016.    Copy of ICM check sent to Dr. Caryl Comes.   3 month ICM trend: 10/26/2016   1 Year ICM trend:      Rosalene Billings, RN 10/27/2016 1:31 PM

## 2016-11-04 IMAGING — CR DG CHEST 2V
2 series · 2 of 2 positions shown · non-contrast
Comparison: PA and lateral chest x-ray March 23, 2014

CLINICAL DATA: History of cardiomyopathy, atrial fibrillation, CHF,
COPD

EXAM:
CHEST  2 VIEW

[w chest pa *]
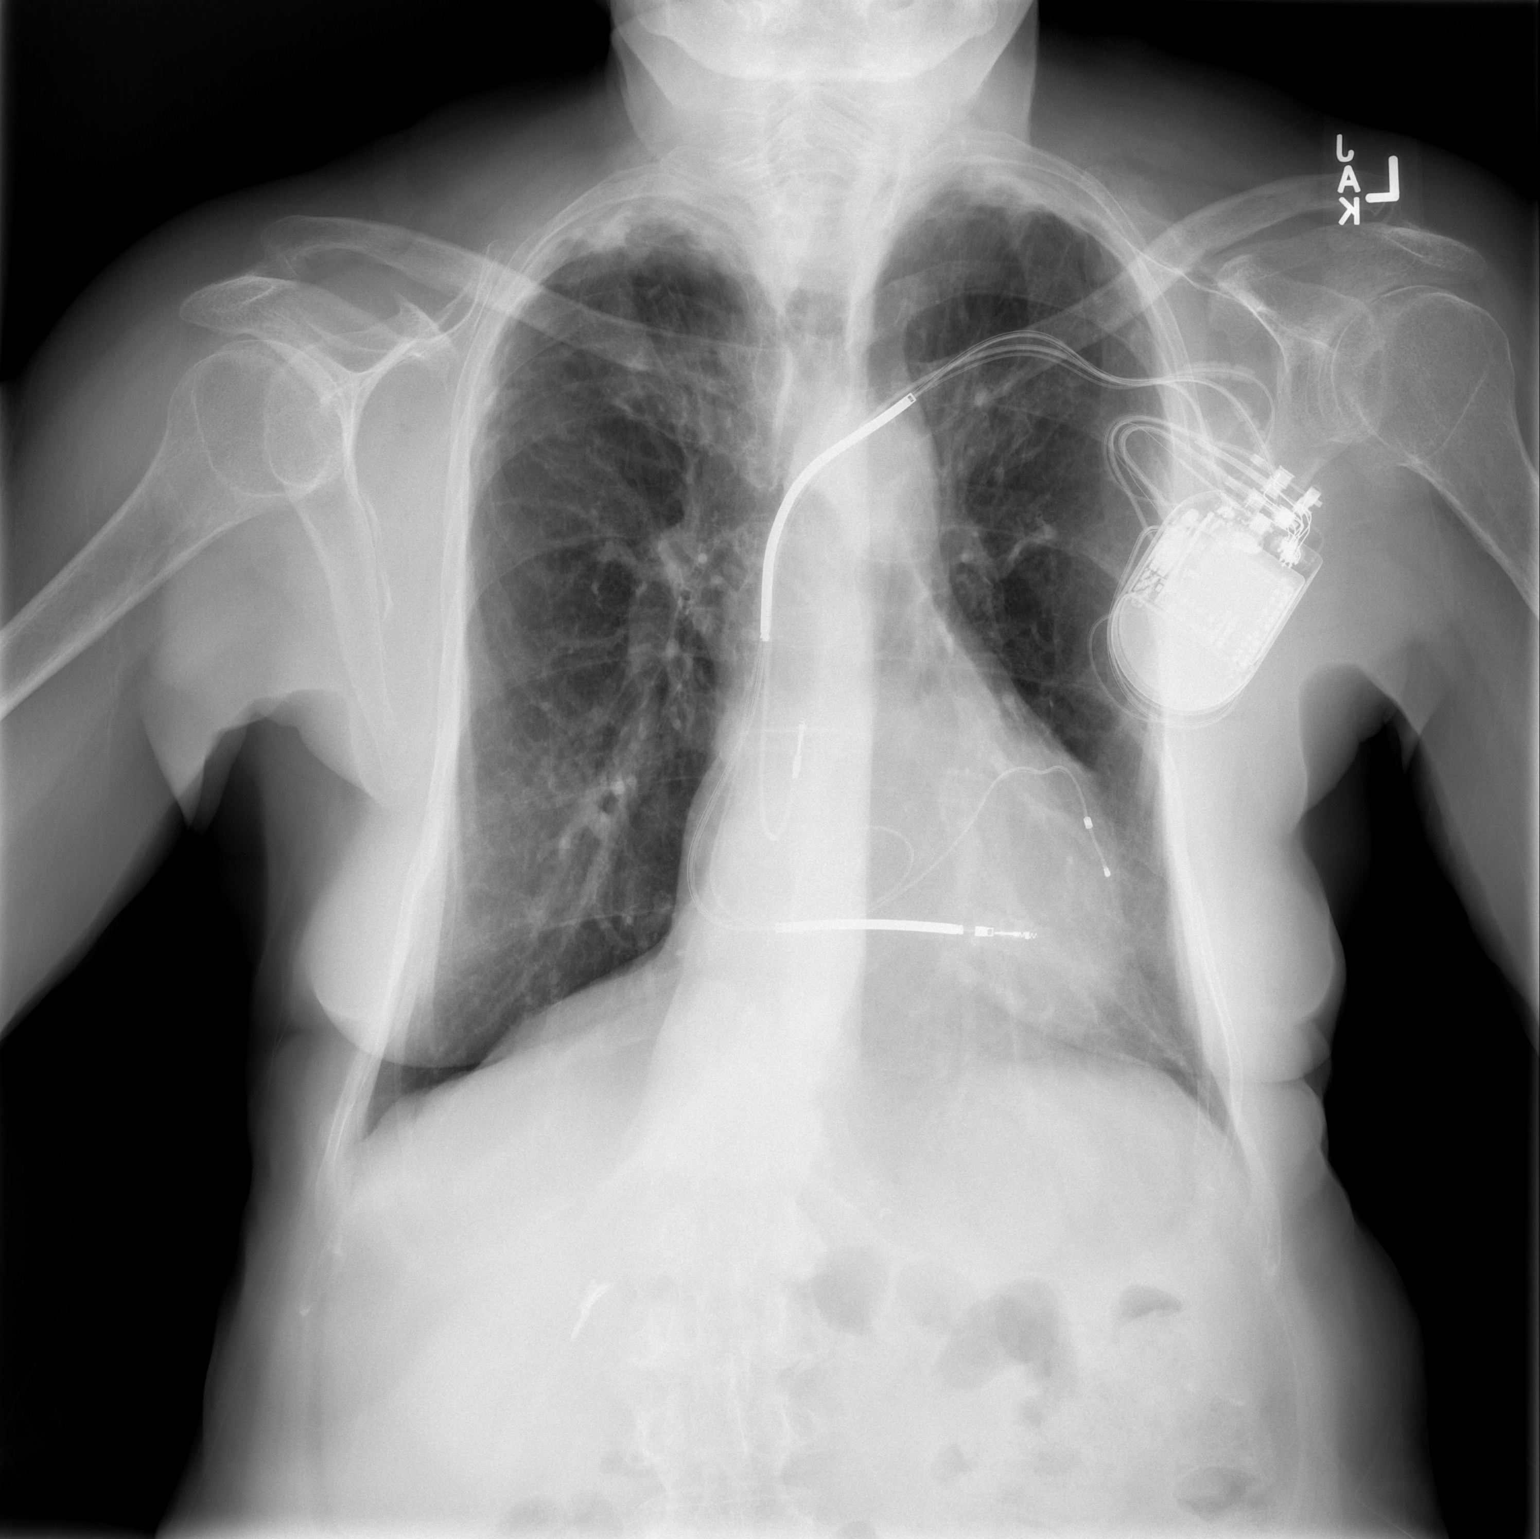

[w chest lat]
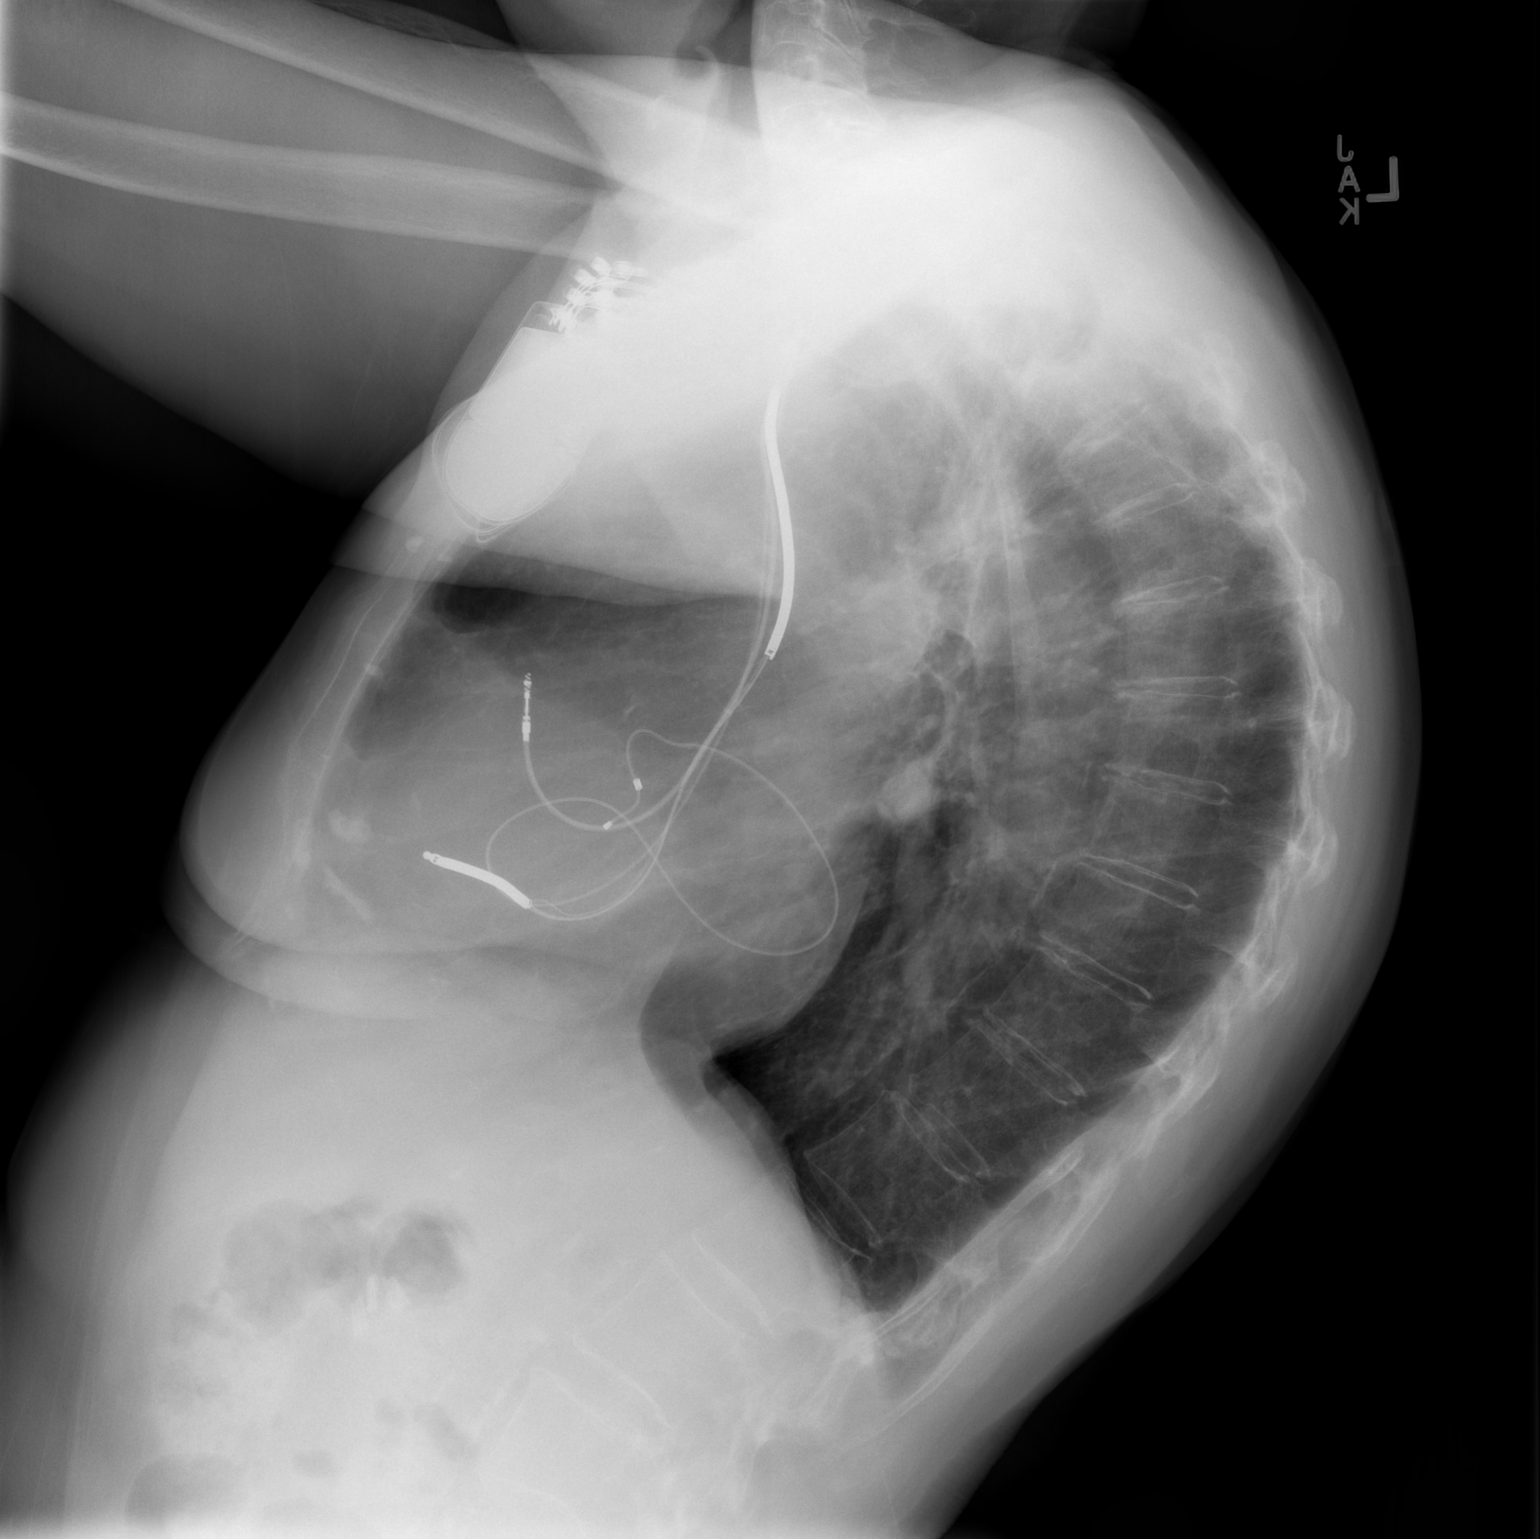

[2 of 2 positions shown; findings below may reference images not displayed]

FINDINGS: The lungs remain hyperinflated. There is stable biapical pleural
thickening. The interstitial markings are coarse but stable. The
heart is top-normal in size. The pulmonary vascularity is normal.
The permanent pacemaker defibrillator is in stable position. The
bones are osteopenic. There is prominent thoracic kyphosis.
IMPRESSION: COPD. Stable biapical pleural thickening consistent with previous
granulomatous infection. No CHF nor pneumonia.

## 2016-11-10 ENCOUNTER — Telehealth: Payer: Self-pay | Admitting: *Deleted

## 2016-11-10 NOTE — Telephone Encounter (Signed)
ERI reached on 11/09/16. Patient notified. Will send to Lorenda Hatchet for scheduling with SK or EP APP. Patient verbalized understanding.

## 2016-11-14 LAB — CUP PACEART REMOTE DEVICE CHECK
Date Time Interrogation Session: 20181030163543
Implantable Lead Implant Date: 20060925
Implantable Lead Location: 753858
Implantable Lead Location: 753860
Implantable Lead Model: 1580
Implantable Pulse Generator Implant Date: 20130213
MDC IDC LEAD IMPLANT DT: 20060925
MDC IDC LEAD IMPLANT DT: 20060925
MDC IDC LEAD LOCATION: 753859
Pulse Gen Serial Number: 7025184

## 2016-11-27 ENCOUNTER — Ambulatory Visit (INDEPENDENT_AMBULATORY_CARE_PROVIDER_SITE_OTHER): Payer: Medicare HMO

## 2016-11-27 DIAGNOSIS — I5022 Chronic systolic (congestive) heart failure: Secondary | ICD-10-CM | POA: Diagnosis not present

## 2016-11-27 DIAGNOSIS — Z95810 Presence of automatic (implantable) cardiac defibrillator: Secondary | ICD-10-CM | POA: Diagnosis not present

## 2016-11-28 ENCOUNTER — Ambulatory Visit: Payer: Medicare HMO | Admitting: Neurology

## 2016-11-28 NOTE — Progress Notes (Signed)
EPIC Encounter for ICM Monitoring  Patient Name: [name] is a 76 y.o. female Date: 11/28/2016 Primary Care Physican: [name], MD Primary [place] Electrophysiologist: Faustino Congress Weight:unknown Battery Longevity: Reached ERI 11/09/16      Heart Failure questions reviewed, pt asymptomatic.   Thoracic impedance normal.  Prescribed by Dr Jannette Fogo: Furosemide 20 mg 1 tablet daily  Labs: 07/26/2016 Creatinine 0.94, BUN 11, Potassium 4.5, Sodium 142, EGFR 59-68 03/23/2016 Creatinine 1.23, BUN 22, Potassium 3.9, Sodium 141 03/22/2016 Creatinine 1.22  08/01/2017Creatinine 1.10, BUN 14, Potassium 4.2, Sodium 141   Recommendations: No changes.  Encouraged to call for fluid symptoms.  Follow-up plan: ICM clinic phone appointment on 12/28/2016.  Office appointment scheduled 12/15/2016 with Dr. Caryl Comes to discuss battery replacement.  Advised patient of scheduled appointment.   Copy of ICM check sent to Dr. Caryl Comes.   3 month ICM trend: 11/28/2016    1 Year ICM trend:       Rosalene Billings, RN 11/28/2016 11:50 AM

## 2016-12-05 ENCOUNTER — Encounter: Payer: Self-pay | Admitting: Neurology

## 2016-12-05 ENCOUNTER — Ambulatory Visit: Payer: Medicare HMO | Admitting: Neurology

## 2016-12-05 VITALS — BP 132/66 | HR 74 | Ht 64.0 in | Wt 160.0 lb

## 2016-12-05 DIAGNOSIS — G40109 Localization-related (focal) (partial) symptomatic epilepsy and epileptic syndromes with simple partial seizures, not intractable, without status epilepticus: Secondary | ICD-10-CM | POA: Diagnosis not present

## 2016-12-05 DIAGNOSIS — Z8679 Personal history of other diseases of the circulatory system: Secondary | ICD-10-CM

## 2016-12-05 MED ORDER — LAMOTRIGINE 25 MG PO TABS: ORAL_TABLET | ORAL | 3 refills | Status: DC

## 2016-12-05 MED ORDER — LEVETIRACETAM 1000 MG PO TABS: ORAL_TABLET | ORAL | 3 refills | Status: DC

## 2016-12-05 NOTE — Progress Notes (Signed)
NEUROLOGY FOLLOW UP OFFICE NOTE  [name] [id number]  HISTORY OF PRESENT ILLNESS: I had the pleasure of seeing [name] in follow-up in the neurology clinic on 12/05/2016.  The patient was last seen 6 months ago for seizures. She has a history of craniotomy for right epidural hematoma. She had continued to report simple partial seizures with left foot numbness that travels up the left side of her body while on Keppra 1500mg  BID. We added on Lamotrigine in February 2018, she reports only one episode of left foot numbness in April that took 1.5 hours to recover from. She denies any further seizures and denies any side effects on her seizure medications. She reports more headaches since her defibrillator delivered inappropriate shocks in March, also reports more neck and back pain. She cannot stand longer than 15 minutes at a time. She has RLS, worse at times. She reports a lot of stress, her husband has been sick recently. She denies any staring/unresponsive episodes, olfactory/gustatory hallucinations, myoclonic jerks, dizziness, diplopia, no rash. She does not drive.   HPI: This is a 76 yo RH woman with a history of cardiomyopathy s/p ICD placement, hypertension, hyperlipidemia, diabetes, paroxysmal atrial fibrillation, who had a fall in December 2012, found to have multiple right extraaxial intracranial hemorrhages and INR >20 while on Coumadin at that time. She had a witnessed seizure in [place] then transferred to [facility] where she had a right craniotomy of evacuation of epidural hematoma. She had another seizure after the surgery. She was discharged home on Keppra. She had an admission in May 2013 for recurrent seizures, there is note that she was being weaned off seizure medication, then had a seizure and Keppra was restarted. She was reported to have twitching. I personally reviewed head CT done at that time which showed right frontal craniotomy, no residual fluid  collection. Remote cortical infarcts/contusions in the right and left occipital regions. She denies any further seizures until 05/24/14 when she started having uncontrollable twitching on the left side of her face and left arm per ER notes. She tells me today that her whole body was shaking. She was awake and alert, able to communicate, denies confusion. She denied focal weakness after. She was given IV Ativan with resolution of twitching. She had a head CT (images unavailable for review) which showed post-surgical right frontal craniotomy changes, chronic right PCA infarct, no acute abnormalities. She had an EEG reported as normal awake and drowsy EEG. She saw her PCP and had an elevated Keppra level on Keppra 750mg  BID, dose was reduced to 500mg  BID. She denies any further episodes of left-sided twitching. She has a prescription for prn Ativan.   Epilepsy Risk Factors: Right-sided intracranial hemorrhage in 2012 s/p craniotomy. Otherwise she had a normal birth and early development. There is no history of febrile convulsions, CNS infections such as meningitis/encephalitis, or family history of seizures.  PAST MEDICAL HISTORY: Past Medical History:  Diagnosis Date  . 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  s/p revision 2/2 ERI 02/28/2011 - [facility] CD 3257  . Chronic systolic heart failure ([place])       . COPD (chronic obstructive pulmonary disease) ([place])    a. pt reports "scarring" at the lungs due to double PNA in the past.  . DM2 (diabetes mellitus, type 2) ([place])   . HLD (hyperlipidemia)   . Hypertension   . Hypokalemia   . ICD (implantable cardioverter-defibrillator) malfunction 03/22/2016  . LBBB (left  bundle branch block)   . NICM (nonischemic cardiomyopathy) ([place])    a. Initial EF of 35%. b. Normal in 07/2009. c. 35% in 2012, 30% in 12/2011. Reported h/o catheterization done presumably before her ICD implanted at [facility] demonstrated some years ago no obstructive  coronary disease. Neg nuc 12/2011.  [name] PAF (paroxysmal atrial fibrillation) ([place])    a. Not on anticoag due to h/o SDH.  . Seizures ([place])   . Subdural hematoma ([place])    a. 12/12. Hospitalized at [facility], requiring craniotomy, complicated by seizures.  . Vitamin D deficiency     MEDICATIONS: Current Outpatient Medications on File Prior to Visit  Medication Sig Dispense Refill  . albuterol (PROVENTIL HFA;VENTOLIN HFA) 108 (90 Base) MCG/ACT inhaler Inhale 1 puff into the lungs every 6 (six) hours as needed for wheezing or shortness of breath.    [name] albuterol (PROVENTIL) (2.5 MG/3ML) 0.083% nebulizer solution Take 2.5 mg by nebulization every 6 (six) hours as needed for wheezing or shortness of breath.     [name] alendronate (FOSAMAX) 70 MG tablet     . atorvastatin (LIPITOR) 40 MG tablet Take 40 mg by mouth daily at 6 PM.     . carvedilol (COREG) 25 MG tablet Take 25 mg by mouth 2 (two) times daily with a meal.      . furosemide (LASIX) 20 MG tablet Take 20 mg by mouth daily.    [name] lamoTRIgine (LAMICTAL) 25 MG tablet Take 1 tablet at night for 2 weeks, then increase to 1 tablet twice a day 180 tablet 3  . levETIRAcetam (KEPPRA) 1000 MG tablet TAKE 1 AND 1/2 TABLETS TWICE DAILY 270 tablet 3  . LORazepam (ATIVAN) 1 MG tablet Take 2 mg by mouth every 8 (eight) hours as needed for seizure. Patient states she takes 2 tablets when needed.    . Multiple Vitamins-Calcium (ONE-A-DAY WOMENS PO) Take 1 tablet by mouth daily.     . nitroGLYCERIN (NITROSTAT) 0.4 MG SL tablet Place 0.4 mg under the tongue every 5 (five) minutes as needed for chest pain (MAX 3 TABLETS).     [name] omeprazole (PRILOSEC) 40 MG capsule Take 40 mg by mouth 2 (two) times daily.     . potassium chloride (K-DUR) 10 MEQ tablet Take 20 mEq by mouth 2 (two) times daily. Take 2 tablets by mouth twice daily    . sacubitril-valsartan (ENTRESTO) 49-51 MG Take 0.5 tablets by mouth daily.    . sitaGLIPtan (JANUVIA) 100 MG tablet Take 100 mg by mouth  daily.       No current facility-administered medications on file prior to visit.     ALLERGIES: Allergies  Allergen Reactions  . Codeine Other (See Comments)    "puts me on a trip"  . Gemfibrozil Diarrhea  . Lactose Intolerance (Gi) Diarrhea  . Morphine And Related Other (See Comments)    unknown  . Valium Hives and Rash    FAMILY HISTORY: Family History  Problem Relation Age of Onset  . Heart disease Mother   . Heart disease Father   . Heart disease Unknown     SOCIAL HISTORY: Social History   Socioeconomic History  . Marital status: Married    Spouse name: Not on file  . Number of children: 5  . Years of education: Not on file  . Highest education level: Not on file  Social Needs  . Financial resource strain: Not on file  . Food insecurity - worry: Not on file  . Food  insecurity - inability: Not on file  . Transportation needs - medical: Not on file  . Transportation needs - non-medical: Not on file  Occupational History  . Occupation: retired  Tobacco Use  . Smoking status: Former Smoker    Types: Cigarettes    Last attempt to quit: 01/17/1968    Years since quitting: 48.9  . Smokeless tobacco: Never Used  Substance and Sexual Activity  . Alcohol use: No    Alcohol/week: 0.0 oz  . Drug use: No  . Sexual activity: Not on file  Other Topics Concern  . Not on file  Social History Narrative   Registration notes - ICD = St. Jude    REVIEW OF SYSTEMS: Constitutional: No fevers, chills, or sweats, + generalized fatigue, change in appetite Eyes: No visual changes, double vision, eye pain Ear, nose and throat: No hearing loss, ear pain, nasal congestion, sore throat Cardiovascular: No chest pain, palpitations Respiratory:  No shortness of breath at rest or with exertion, wheezes GastrointestinaI: No nausea, vomiting, diarrhea, abdominal pain, fecal incontinence Genitourinary:  No dysuria, urinary retention or frequency Musculoskeletal:  No neck pain,+ back  pain Integumentary: No rash, pruritus, skin lesions Neurological: as above Psychiatric: No depression, insomnia, anxiety Endocrine: No palpitations, fatigue, diaphoresis, mood swings, change in appetite, change in weight, increased thirst Hematologic/Lymphatic:  No anemia, purpura, petechiae. Allergic/Immunologic: no itchy/runny eyes, nasal congestion, recent allergic reactions, rashes  PHYSICAL EXAM: Vitals:   12/05/16 0825  BP: 132/66  Pulse: 74  SpO2: 99%   General: No acute distress, on O2 via [place] Head:  Normocephalic/atraumatic Neck: supple, no paraspinal tenderness, full range of motion Heart:  Regular rate and rhythm Lungs:  Clear to auscultation bilaterally Back: No paraspinal tenderness Skin/Extremities: No rash, no edema Neurological Exam: alert and oriented to person, place, and time. No aphasia or dysarthria. Fund of knowledge is appropriate.  Recent and remote memory are intact.  Attention and concentration are normal.    Able to name objects and repeat phrases. Cranial nerves: Pupils equal, round, reactive to light.  Extraocular movements intact with no nystagmus. Visual fields full. Facial sensation intact. No facial asymmetry. Tongue, uvula, palate midline.  Motor: Bulk and tone normal, muscle strength 5/5 throughout with no pronator drift.  Sensation to light touch intact.  No extinction to double simultaneous stimulation.  Deep tendon reflexes 2+ throughout, toes downgoing.  Finger to nose testing intact.  Gait slow and cautious without cane (better with cane), unable to tandem walk.  IMPRESSION: This is a 76 yo RH woman with a history of hypertension, hyperlipidemia, diabetes, cardiomyopathy s/p ICD placement, paroxysmal atrial fibrillation off anticoagulation due to intracranial bleed in 2012 with subsequent seizures. She had been seizure-free for 3 years until she had a breakthrough simple partial seizure with left-sided twitching in May 2016. EEG normal, head CT no acute  changes. She denies any further twitching, but continued to have simple partial sensory seizures with numbness that travels up her left side on Keppra 1500mg  BID. These have improved with addition of low dose Lamotrigine 25mg  BID, no seizures since April 2018. She has prn Ativan to take for seizure. She is aware of [place] driving laws to stop driving after a seizure, until 6 months seizure-free. She will follow-up in 6 months and knows to call for any changes.   Thank you for allowing me to participate in her care.  Please do not hesitate to call for any questions or concerns.  The duration of this appointment visit  was 15 minutes of face-to-face time with the patient.  Greater than 50% of this time was spent in counseling, explanation of diagnosis, planning of further management, and coordination of care.   Ellouise Newer, M.D.   CC: Dr. Jannette Fogo

## 2016-12-05 NOTE — Patient Instructions (Signed)
1. Continue all your medications 2. Follow-up in 6 months, call for any changes Happy Holidays!!  Seizure Precautions: 1. If medication has been prescribed for you to prevent seizures, take it exactly as directed.  Do not stop taking the medicine without talking to your doctor first, even if you have not had a seizure in a long time.   2. Avoid activities in which a seizure would cause danger to yourself or to others.  Don't operate dangerous machinery, swim alone, or climb in high or dangerous places, such as on ladders, roofs, or girders.  Do not drive unless your doctor says you may.  3. If you have any warning that you may have a seizure, lay down in a safe place where you can't hurt yourself.    4.  No driving for 6 months from last seizure, as per [facility].   Please refer to the following link on the [place] website for more information: http://[email or web address]/[email or web address]/[email or web address]   5.  Maintain good sleep hygiene. Avoid alcohol.  6.  Contact your doctor if you have any problems that may be related to the medicine you are taking.  7.  Call 911 and bring the patient back to the ED if:        A.  The seizure lasts longer than 5 minutes.       B.  The patient doesn't awaken shortly after the seizure  C.  The patient has new problems such as difficulty seeing, speaking or moving  D.  The patient was injured during the seizure  E.  The patient has a temperature over 102 F (39C)  F.  The patient vomited and now is having trouble breathing

## 2016-12-15 ENCOUNTER — Ambulatory Visit: Payer: Medicare HMO | Admitting: Internal Medicine

## 2016-12-15 ENCOUNTER — Encounter (INDEPENDENT_AMBULATORY_CARE_PROVIDER_SITE_OTHER): Payer: Self-pay

## 2016-12-15 ENCOUNTER — Encounter: Payer: Self-pay | Admitting: Internal Medicine

## 2016-12-15 VITALS — BP 114/68 | HR 80 | Ht 64.0 in | Wt 161.0 lb

## 2016-12-15 DIAGNOSIS — I428 Other cardiomyopathies: Secondary | ICD-10-CM

## 2016-12-15 DIAGNOSIS — Z95810 Presence of automatic (implantable) cardiac defibrillator: Secondary | ICD-10-CM

## 2016-12-15 DIAGNOSIS — I5022 Chronic systolic (congestive) heart failure: Secondary | ICD-10-CM | POA: Diagnosis not present

## 2016-12-15 DIAGNOSIS — I447 Left bundle-branch block, unspecified: Secondary | ICD-10-CM

## 2016-12-15 DIAGNOSIS — I480 Paroxysmal atrial fibrillation: Secondary | ICD-10-CM | POA: Diagnosis not present

## 2016-12-15 LAB — CUP PACEART INCLINIC DEVICE CHECK
Battery Remaining Longevity: 0 mo
Brady Statistic RA Percent Paced: 0.14 %
Brady Statistic RV Percent Paced: 0.09 %
Date Time Interrogation Session: 20181130162224
HIGH POWER IMPEDANCE MEASURED VALUE: 46.4894
Implantable Lead Implant Date: 20060925
Implantable Lead Implant Date: 20060925
Implantable Lead Location: 753858
Implantable Lead Location: 753860
Lead Channel Impedance Value: 387.5 Ohm
Lead Channel Pacing Threshold Amplitude: 0.75 V
Lead Channel Pacing Threshold Amplitude: 0.75 V
Lead Channel Pacing Threshold Amplitude: 0.75 V
Lead Channel Pacing Threshold Pulse Width: 0.5 ms
Lead Channel Pacing Threshold Pulse Width: 0.5 ms
Lead Channel Sensing Intrinsic Amplitude: 2.5 mV
MDC IDC LEAD IMPLANT DT: 20060925
MDC IDC LEAD LOCATION: 753859
MDC IDC MSMT LEADCHNL RA PACING THRESHOLD PULSEWIDTH: 0.5 ms
MDC IDC MSMT LEADCHNL RV IMPEDANCE VALUE: 325 Ohm
MDC IDC MSMT LEADCHNL RV PACING THRESHOLD AMPLITUDE: 0.75 V
MDC IDC MSMT LEADCHNL RV PACING THRESHOLD PULSEWIDTH: 0.5 ms
MDC IDC MSMT LEADCHNL RV SENSING INTR AMPL: 6 mV
MDC IDC PG IMPLANT DT: 20130213
MDC IDC SET LEADCHNL RA PACING AMPLITUDE: 1.75 V
MDC IDC SET LEADCHNL RV PACING AMPLITUDE: 2 V
MDC IDC SET LEADCHNL RV PACING PULSEWIDTH: 0.5 ms
MDC IDC SET LEADCHNL RV SENSING SENSITIVITY: 0.5 mV
Pulse Gen Serial Number: 7025184

## 2016-12-15 NOTE — Patient Instructions (Addendum)
Medication Instructions:  Your physician has recommended you make the following change in your medication:   1.)  Lasix 40 mg every other day   -- If you need a refill on your cardiac medications before your next appointment, please call your pharmacy. --  Labwork: None  Testing/Procedures: none   Follow-Up: Your physician wants you to follow-up in 1 year with Dr. Caryl Comes.   You will receive a reminder letter in the mail two months in advance. If you don't receive a letter, please call our office to schedule the follow-up appointment.  Thank you for choosing [facility]!!    [phone]

## 2016-12-15 NOTE — Progress Notes (Signed)
Patient Care Team: [name], MD as PCP - General (Internal Medicine)   HPI  [name] is a 76 y.o. female seen in followup for CRT-D implanted at [facility] for nonischemic cardiomyopathy.She underwent CRT device generator replacement    DATE TEST    12/12 Echo    EF 35 %   8/17 Echo    EF 45-50 %   4/18 Echo  EF 40--45%    Date Cr K Hgb  7/18  0.94 4.5 12.1           She continues with sob but is now on O2  She has some edema   She was hospitalized March 2018 for inappropriate shocks secondary to lead failure. She had a Riata lead with known externalization. High-voltage therapies were inactivated.     Her device has now reached ERi     Past Medical History:  Diagnosis Date  . 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  s/p revision 2/2 ERI 02/28/2011 - [facility] CD 3257  . Chronic systolic heart failure ([place])       . COPD (chronic obstructive pulmonary disease) ([place])    a. pt reports "scarring" at the lungs due to double PNA in the past.  . DM2 (diabetes mellitus, type 2) ([place])   . HLD (hyperlipidemia)   . Hypertension   . Hypokalemia   . ICD (implantable cardioverter-defibrillator) malfunction 03/22/2016  . LBBB (left bundle branch block)   . NICM (nonischemic cardiomyopathy) ([place])    a. Initial EF of 35%. b. Normal in 07/2009. c. 35% in 2012, 30% in 12/2011. Reported h/o catheterization done presumably before her ICD implanted at [facility] demonstrated some years ago no obstructive coronary disease. Neg nuc 12/2011.  [name] PAF (paroxysmal atrial fibrillation) ([place])    a. Not on anticoag due to h/o SDH.  . Seizures ([place])   . Subdural hematoma ([place])    a. 12/12. Hospitalized at [facility], requiring craniotomy, complicated by seizures.  . Vitamin D deficiency     Past Surgical History:  Procedure Laterality Date  . BRAIN HEMATOMA EVACUATION    . CARDIAC CATHETERIZATION     no significant CAD  . CARDIAC DEFIBRILLATOR PLACEMENT   2006  . CHOLECYSTECTOMY    . IMPLANTABLE CARDIOVERTER DEFIBRILLATOR (ICD) GENERATOR CHANGE N/A 03/01/2011   Procedure: ICD GENERATOR CHANGE;  Surgeon: Deboraha Sprang, MD;  Location: [facility] CATH LAB;  Service: Cardiovascular;  Laterality: N/A;  . TOTAL ABDOMINAL HYSTERECTOMY  1983    Current Outpatient Medications  Medication Sig Dispense Refill  . albuterol (PROVENTIL HFA;VENTOLIN HFA) 108 (90 Base) MCG/ACT inhaler Inhale 1 puff into the lungs every 6 (six) hours as needed for wheezing or shortness of breath.    [name] albuterol (PROVENTIL) (2.5 MG/3ML) 0.083% nebulizer solution Take 2.5 mg by nebulization every 6 (six) hours as needed for wheezing or shortness of breath.     [name] alendronate (FOSAMAX) 70 MG tablet     . atorvastatin (LIPITOR) 40 MG tablet Take 40 mg by mouth daily at 6 PM.     . carvedilol (COREG) 25 MG tablet Take 25 mg by mouth 2 (two) times daily with a meal.      . furosemide (LASIX) 20 MG tablet Take 20 mg by mouth daily.    [name] lamoTRIgine (LAMICTAL) 25 MG tablet Take 1 tablet twice a day 180 tablet 3  . levETIRAcetam (KEPPRA) 1000 MG tablet TAKE 1 AND 1/2 TABLETS TWICE  DAILY 270 tablet 3  . LORazepam (ATIVAN) 1 MG tablet Take 2 mg by mouth every 8 (eight) hours as needed for seizure. Patient states she takes 2 tablets when needed.    . Multiple Vitamins-Calcium (ONE-A-DAY WOMENS PO) Take 1 tablet by mouth daily.     . nitroGLYCERIN (NITROSTAT) 0.4 MG SL tablet Place 0.4 mg under the tongue every 5 (five) minutes as needed for chest pain (MAX 3 TABLETS).     [name] omeprazole (PRILOSEC) 40 MG capsule Take 40 mg by mouth 2 (two) times daily.     . potassium chloride (K-DUR) 10 MEQ tablet Take 20 mEq by mouth 2 (two) times daily. Take 2 tablets by mouth twice daily    . sacubitril-valsartan (ENTRESTO) 49-51 MG Take 0.5 tablets by mouth daily.    . sitaGLIPtan (JANUVIA) 100 MG tablet Take 100 mg by mouth daily.       No current facility-administered medications for this visit.      Allergies  Allergen Reactions  . Codeine Other (See Comments)    "puts me on a trip"  . Gemfibrozil Diarrhea  . Lactose Intolerance (Gi) Diarrhea  . Morphine And Related Other (See Comments)    unknown  . Valium Hives and Rash    Review of Systems negative except from HPI and PMH  Physical Exam BP 114/68   Pulse 80   Ht 5\' 4"  (1.626 m)   Wt 161 lb (73 kg)   SpO2 97%   BMI 27.64 kg/m  Well developed and well nourished in no acute distress HENT traumatic E scleral and icterus clear Neck Supple JVP 8-10  carotids brisk and full Clear to ausculation Regular rate and rhythm,2/6 murmur Soft with active bowel sounds right upper quadrant tenderness  No clubbing cyanosis Trace Edema left lower leg swelling Alert and oriented,  having involuntary jerks on the left side Skin Warm and Dry  ECG demonstrates P. Synchronous pacing with a narrow QRS (134 ms) and left bundle branch pattern  ECG 2011 at paced  QRS duration 150 ms   Assessment and  Plan  Congestive heart failure-acute/chronic-systolic  Nonischemic cardiomyopathy interval near normalization  Seizures  Lead failure  Inappropriate shocks  Asymmetric edema   Renal insufficiency  Gr 3  CRT D.-St. Jude's anterior location of the LV and a very narrow QRS on ECG    Her LV lead has been off for some time.  Her ICD was inactivated in the spring because of inappropriate shocks related to fracture.  She has not paced in the last 14 months.  We have discussed the lack of utility in device replacement.  Specifically, for an ICD she would need lead replacement including potentially extraction.  Furthermore her LVEF is much improved.  Her QRS is narrow so there is no indication for LV lead placement. She is not pacing.  Presumably pacing functions were related to resynchronization.  Hence, we have decided not to pursue device generator replacement.  We will see her in 1 year.

## 2016-12-27 ENCOUNTER — Telehealth: Payer: Self-pay | Admitting: Cardiology

## 2016-12-27 NOTE — Telephone Encounter (Signed)
Spoke w/ pt and requested that he send a manual transmission b/c his home monitor has not updated in at least 14 days. But she informed me that MD does not want her to send any more transmission w/ her home monitor.

## 2016-12-28 ENCOUNTER — Telehealth: Payer: Self-pay | Admitting: Cardiology

## 2016-12-28 NOTE — Telephone Encounter (Signed)
Spoke with pt and reminded pt of remote transmission that is due today. Pt verbalized understanding.   

## 2017-01-02 NOTE — Progress Notes (Signed)
Garlon Hatchet, [place] spoke with patient regarding remote monitoring.  Patient advised her she discussed with Dr Caryl Comes about having reached ERI on her device and will not be having the battery replaced.  Patient advised no further remote monitoring will be done.

## 2017-01-25 ENCOUNTER — Telehealth: Payer: Self-pay | Admitting: Cardiology

## 2017-01-25 ENCOUNTER — Encounter: Payer: Medicare HMO | Admitting: *Deleted

## 2017-01-25 NOTE — Telephone Encounter (Signed)
Spoke with pt and reminded pt of remote transmission that is due today. Pt verbalized understanding.   

## 2017-02-01 ENCOUNTER — Encounter: Payer: Self-pay | Admitting: Cardiology

## 2017-04-13 ENCOUNTER — Telehealth: Payer: Self-pay | Admitting: Cardiology

## 2017-04-13 NOTE — Telephone Encounter (Signed)
Spoke w/ pt and requested that she send a manual transmission b/c her home monitor has not updated in at least 7 days.   

## 2017-04-26 ENCOUNTER — Telehealth: Payer: Self-pay | Admitting: Cardiology

## 2017-04-26 NOTE — Telephone Encounter (Signed)
LMOVM requesting that pt send manual transmission b/c home monitor has not updated in at least 7 days.    

## 2017-05-04 ENCOUNTER — Telehealth: Payer: Self-pay | Admitting: Cardiology

## 2017-05-04 NOTE — Telephone Encounter (Signed)
Spoke w/ pt about disconnected monitor. Pt informed me that MD informed her that there is nothing else he can do for her and her device has reached ERI.

## 2017-06-06 ENCOUNTER — Ambulatory Visit: Payer: Medicare HMO | Admitting: Neurology

## 2017-09-26 ENCOUNTER — Other Ambulatory Visit: Payer: Self-pay | Admitting: Neurology

## 2017-09-26 DIAGNOSIS — G40109 Localization-related (focal) (partial) symptomatic epilepsy and epileptic syndromes with simple partial seizures, not intractable, without status epilepticus: Secondary | ICD-10-CM

## 2017-12-21 ENCOUNTER — Ambulatory Visit: Payer: Medicare HMO | Admitting: Internal Medicine

## 2017-12-21 ENCOUNTER — Encounter (INDEPENDENT_AMBULATORY_CARE_PROVIDER_SITE_OTHER): Payer: Self-pay

## 2017-12-21 ENCOUNTER — Encounter: Payer: Self-pay | Admitting: Internal Medicine

## 2017-12-21 VITALS — BP 110/72 | HR 79 | Ht 64.0 in | Wt 166.0 lb

## 2017-12-21 DIAGNOSIS — Z95810 Presence of automatic (implantable) cardiac defibrillator: Secondary | ICD-10-CM

## 2017-12-21 DIAGNOSIS — I5022 Chronic systolic (congestive) heart failure: Secondary | ICD-10-CM

## 2017-12-21 DIAGNOSIS — I428 Other cardiomyopathies: Secondary | ICD-10-CM | POA: Diagnosis not present

## 2017-12-21 DIAGNOSIS — I480 Paroxysmal atrial fibrillation: Secondary | ICD-10-CM

## 2017-12-21 LAB — BASIC METABOLIC PANEL
BUN/Creatinine Ratio: 12 (ref 12–28)
BUN: 25 mg/dL (ref 8–27)
CALCIUM: 9.8 mg/dL (ref 8.7–10.3)
CO2: 17 mmol/L — AB (ref 20–29)
CREATININE: 2.15 mg/dL — AB (ref 0.57–1.00)
Chloride: 106 mmol/L (ref 96–106)
GFR calc Af Amer: 25 mL/min/{1.73_m2} — ABNORMAL LOW (ref >59)
GFR, EST NON AFRICAN AMERICAN: 21 mL/min/{1.73_m2} — AB (ref >59)
Glucose: 123 mg/dL — ABNORMAL HIGH (ref 65–99)
Potassium: 4.8 mmol/L (ref 3.5–5.2)
Sodium: 142 mmol/L (ref 134–144)

## 2017-12-21 NOTE — Patient Instructions (Signed)

## 2017-12-21 NOTE — Progress Notes (Addendum)
Patient Care Team: [name], MD as PCP - General (Internal Medicine)   HPI  [name] is a 77 y.o. female seen in followup for CRT-D implanted at [facility] for nonischemic cardiomyopathy.She underwent CRT device generator replacement    DATE TEST    12/12 Echo    EF 35 %   8/17 Echo    EF 45-50 %   4/18 Echo  EF 40--45%    Date Cr K Hgb  7/18  0.94 4.5 12.1           She continues with sob but is now on O2  She has some edema   She was hospitalized March 2018 for inappropriate shocks secondary to lead failure. She had a Riata lead with known externalization. High-voltage therapies were inactivated.     Her device  reached ERI and has been abandoned     Past Medical History:  Diagnosis Date  . 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  s/p revision 2/2 ERI 02/28/2011 - [facility] CD 3257  . Chronic systolic heart failure ([place])       . COPD (chronic obstructive pulmonary disease) ([place])    a. pt reports "scarring" at the lungs due to double PNA in the past.  . DM2 (diabetes mellitus, type 2) ([place])   . HLD (hyperlipidemia)   . Hypertension   . Hypokalemia   . ICD (implantable cardioverter-defibrillator) malfunction 03/22/2016  . LBBB (left bundle branch block)   . NICM (nonischemic cardiomyopathy) ([place])    a. Initial EF of 35%. b. Normal in 07/2009. c. 35% in 2012, 30% in 12/2011. Reported h/o catheterization done presumably before her ICD implanted at [facility] demonstrated some years ago no obstructive coronary disease. Neg nuc 12/2011.  [name] PAF (paroxysmal atrial fibrillation) ([place])    a. Not on anticoag due to h/o SDH.  . Seizures ([place])   . Subdural hematoma ([place])    a. 12/12. Hospitalized at [facility], requiring craniotomy, complicated by seizures.  . Vitamin D deficiency     Past Surgical History:  Procedure Laterality Date  . BRAIN HEMATOMA EVACUATION    . CARDIAC CATHETERIZATION     no significant CAD  . CARDIAC  DEFIBRILLATOR PLACEMENT  2006  . CHOLECYSTECTOMY    . IMPLANTABLE CARDIOVERTER DEFIBRILLATOR (ICD) GENERATOR CHANGE N/A 03/01/2011   Procedure: ICD GENERATOR CHANGE;  Surgeon: Deboraha Sprang, MD;  Location: [facility] CATH LAB;  Service: Cardiovascular;  Laterality: N/A;  . TOTAL ABDOMINAL HYSTERECTOMY  1983    Current Outpatient Medications  Medication Sig Dispense Refill  . albuterol (PROVENTIL HFA;VENTOLIN HFA) 108 (90 Base) MCG/ACT inhaler Inhale 1 puff into the lungs every 6 (six) hours as needed for wheezing or shortness of breath.    [name] albuterol (PROVENTIL) (2.5 MG/3ML) 0.083% nebulizer solution Take 2.5 mg by nebulization every 6 (six) hours as needed for wheezing or shortness of breath.     [name] alendronate (FOSAMAX) 70 MG tablet     . atorvastatin (LIPITOR) 40 MG tablet Take 40 mg by mouth daily at 6 PM.     . carvedilol (COREG) 25 MG tablet Take 25 mg by mouth 2 (two) times daily with a meal.      . furosemide (LASIX) 20 MG tablet Take 20 mg by mouth daily.    [name] lamoTRIgine (LAMICTAL) 25 MG tablet TAKE 1 TABLET TWICE DAILY 180 tablet 1  . levETIRAcetam (KEPPRA) 1000 MG tablet TAKE 1 AND 1/2  TABLETS TWICE DAILY 270 tablet 1  . LORazepam (ATIVAN) 1 MG tablet Take 2 mg by mouth every 8 (eight) hours as needed for seizure. Patient states she takes 2 tablets when needed.    . Multiple Vitamins-Calcium (ONE-A-DAY WOMENS PO) Take 1 tablet by mouth daily.     . nitroGLYCERIN (NITROSTAT) 0.4 MG SL tablet Place 0.4 mg under the tongue every 5 (five) minutes as needed for chest pain (MAX 3 TABLETS).     [name] omeprazole (PRILOSEC) 40 MG capsule Take 40 mg by mouth 2 (two) times daily.     . potassium chloride (K-DUR) 10 MEQ tablet Take 20 mEq by mouth 2 (two) times daily. Take 2 tablets by mouth twice daily    . sacubitril-valsartan (ENTRESTO) 49-51 MG Take 0.5 tablets by mouth daily.    . sitaGLIPtan (JANUVIA) 100 MG tablet Take 100 mg by mouth daily.       No current facility-administered medications for  this visit.     Allergies  Allergen Reactions  . Codeine Other (See Comments)    "puts me on a trip"  . Gemfibrozil Diarrhea  . Lactose Intolerance (Gi) Diarrhea  . Morphine And Related Other (See Comments)    unknown  . Valium Hives and Rash    Review of Systems negative except from HPI and PMH  Physical Exam BP 110/72   Pulse 79   Ht 5\' 4"  (1.626 m)   Wt 166 lb (75.3 kg)   SpO2 99%   BMI 28.49 kg/m  Well developed and nourished in no acute distress wearing O2  HENT normal Neck supple with JVP-flat Decreased breath sounds Regular rate and rhythm, no murmurs or gallops Abd-soft with active BS No Clubbing cyanosis edema Skin-warm and dry A & Oriented  Grossly normal sensory and motor function   ECG sinus @ 79 14/13/40 Extreme right axis deviation  Assessment and  Plan  Congestive heart failure-acute/chronic-systolic  Nonischemic cardiomyopathy interval near normalization  Seizures  Oxygen dependent COPD  Asymmetric edema   Renal insufficiency  Gr 3  CRT D.-St. Jude's at EOS not replaced   Functional status is stable and limited because of oxygen dependent COPD no chest pain or edema.  Continue current medications.  Will check BMEt on entresto

## 2017-12-26 ENCOUNTER — Telehealth: Payer: Self-pay | Admitting: *Deleted

## 2017-12-26 DIAGNOSIS — I5022 Chronic systolic (congestive) heart failure: Secondary | ICD-10-CM

## 2017-12-26 NOTE — Telephone Encounter (Signed)
Notes recorded by Thompson Grayer, RN on 12/26/2017 at 11:04 AM EST Reviewed with Dr. Irish Lack (DOD). Instructions given for pt to stop Entresto, stay hydrated and recheck BMP on 12/31/17. I spoke with pt and reviewed lab work/instructions with her

## 2017-12-31 ENCOUNTER — Other Ambulatory Visit: Payer: Medicare HMO | Admitting: *Deleted

## 2017-12-31 DIAGNOSIS — I5022 Chronic systolic (congestive) heart failure: Secondary | ICD-10-CM

## 2017-12-31 LAB — BASIC METABOLIC PANEL
BUN/Creatinine Ratio: 12 (ref 12–28)
BUN: 21 mg/dL (ref 8–27)
CALCIUM: 8.2 mg/dL — AB (ref 8.7–10.3)
CHLORIDE: 106 mmol/L (ref 96–106)
CO2: 19 mmol/L — ABNORMAL LOW (ref 20–29)
CREATININE: 1.8 mg/dL — AB (ref 0.57–1.00)
GFR calc Af Amer: 31 mL/min/{1.73_m2} — ABNORMAL LOW (ref >59)
GFR calc non Af Amer: 27 mL/min/{1.73_m2} — ABNORMAL LOW (ref >59)
GLUCOSE: 128 mg/dL — AB (ref 65–99)
Potassium: 4.5 mmol/L (ref 3.5–5.2)
Sodium: 143 mmol/L (ref 134–144)

## 2018-01-04 ENCOUNTER — Telehealth: Payer: Self-pay | Admitting: Internal Medicine

## 2018-01-04 NOTE — Telephone Encounter (Signed)
  Patient returning call regarding results 

## 2018-01-07 NOTE — Telephone Encounter (Signed)
Called and spoke with patient, she is aware of lab results drawn on 12/31/17. Patient states that she is taking Entresto 1 tablet twice a day.

## 2018-01-07 NOTE — Telephone Encounter (Signed)
Called and spoke with patient again; she held Entresto 24-26MG  1 tablet twice a day since speaking with Pat on 12/26/17. She is aware that we will discuss labs with Dr.  Caryl Comes and call her back tomorrow about Entresto.

## 2018-01-08 NOTE — Telephone Encounter (Signed)
Left detailed VM with pt. Per Dr Caryl Comes, he would like pt to continue to hold her entresto.

## 2018-01-10 ENCOUNTER — Encounter

## 2018-01-10 ENCOUNTER — Other Ambulatory Visit: Payer: Self-pay

## 2018-01-10 ENCOUNTER — Encounter: Payer: Self-pay | Admitting: Neurology

## 2018-01-10 ENCOUNTER — Ambulatory Visit: Payer: Medicare HMO | Admitting: Neurology

## 2018-01-10 VITALS — BP 138/70 | HR 82 | Ht 64.0 in | Wt 166.0 lb

## 2018-01-10 DIAGNOSIS — G40109 Localization-related (focal) (partial) symptomatic epilepsy and epileptic syndromes with simple partial seizures, not intractable, without status epilepticus: Secondary | ICD-10-CM | POA: Diagnosis not present

## 2018-01-10 MED ORDER — LEVETIRACETAM 1000 MG PO TABS: ORAL_TABLET | ORAL | 3 refills | Status: DC

## 2018-01-10 MED ORDER — LAMOTRIGINE 25 MG PO TABS: ORAL_TABLET | ORAL | 3 refills | Status: DC

## 2018-01-10 MED ORDER — LAMOTRIGINE 25 MG PO TABS: ORAL_TABLET | ORAL | 4 refills | Status: DC

## 2018-01-10 NOTE — Patient Instructions (Addendum)
1. Increase Lamotrigine 25mg : take 2 tablets twice a day. A prescription has been sent to [facility] mail order pharmacy.  2. Continue Levetiracetam 1000mg : Take 1.5 tablets twice a day  3. Take the lorazepam (Ativan) only as needed for seizure activity, 1-2 times a week if necessary  4. Call our office for an update in 3 months, if numbness is still occurring frequently, we can increase Lamotrigine dose further  5. Follow-up in 6 months, call for any changes.  Seizure Precautions: 1. If medication has been prescribed for you to prevent seizures, take it exactly as directed.  Do not stop taking the medicine without talking to your doctor first, even if you have not had a seizure in a long time.   2. Avoid activities in which a seizure would cause danger to yourself or to others.  Don't operate dangerous machinery, swim alone, or climb in high or dangerous places, such as on ladders, roofs, or girders.  Do not drive unless your doctor says you may.  3. If you have any warning that you may have a seizure, lay down in a safe place where you can't hurt yourself.    4.  No driving for 6 months from last seizure, as per [facility].   Please refer to the following link on the [facility] website for more information: http://[email or web address].org/[email or web address]/driving/drivingu.cfm   5.  Maintain good sleep hygiene. Avoid alcohol.  6.  Contact your doctor if you have any problems that may be related to the medicine you are taking.  7.  Call 911 and bring the patient back to the ED if:        A.  The seizure lasts longer than 5 minutes.       B.  The patient doesn't awaken shortly after the seizure  C.  The patient has new problems such as difficulty seeing, speaking or moving  D.  The patient was injured during the seizure  E.  The patient has a temperature over 102 F (39C)  F.  The patient vomited and now is having trouble  breathing

## 2018-01-10 NOTE — Progress Notes (Signed)
NEUROLOGY FOLLOW UP OFFICE NOTE  [name] [id number]  DOB: [birth year]  HISTORY OF PRESENT ILLNESS: I had the pleasure of seeing [name] in follow-up in the neurology clinic on 01/10/2018.  The patient was last seen a year ago for seizures. She is accompanied by her daughter who helps supplement the history today. She has a history of craniotomy for right epidural hematoma. She had continued to report simple partial seizures with left foot numbness that travels up the left side of her body while on Keppra 1500mg  BID, which improved with additional of low dose Lamotrigine 25mg  BID in February 2018. She reports that seizures have been quiet since her last visit until 4 days ago when she had another episode of left foot numbness and went to her daughter's room. She felt there was mild twitching although her daughter did not witness this. No weakness. Symptoms lasted around an hour, no associated staring/unresponsiveness or speech difficulties/confusion. She saw her PCP and was given prn Ativan, but she misunderstood instructions and has been taking it daily the past 3 days. She had another brief episode of left foot numbness this morning. She feels seizures started due to her recent respiratory illness, she finished her course of antibiotics this morning. She denies any side effects on Keppra 1500mg  BID and Lamotrigine 25mg  BID, no headaches, dizziness, vision changes, no falls. She cannot stand for long periods of time with back pain and fatigue. She has a walker in there office today.  History on Initial Assessment 07/28/2014: This is a 77 yo RH woman with a history of cardiomyopathy s/p ICD placement, hypertension, hyperlipidemia, diabetes, paroxysmal atrial fibrillation, who had a fall in December 2012, found to have multiple right extraaxial intracranial hemorrhages and INR >20 while on Coumadin at that time. She had a witnessed seizure in [place] then transferred to [facility]  where she had a right craniotomy of evacuation of epidural hematoma. She had another seizure after the surgery. She was discharged home on Keppra. She had an admission in May 2013 for recurrent seizures, there is note that she was being weaned off seizure medication, then had a seizure and Keppra was restarted. She was reported to have twitching. I personally reviewed head CT done at that time which showed right frontal craniotomy, no residual fluid collection. Remote cortical infarcts/contusions in the right and left occipital regions. She denies any further seizures until 05/24/14 when she started having uncontrollable twitching on the left side of her face and left arm per ER notes. She tells me today that her whole body was shaking. She was awake and alert, able to communicate, denies confusion. She denied focal weakness after. She was given IV Ativan with resolution of twitching. She had a head CT (images unavailable for review) which showed post-surgical right frontal craniotomy changes, chronic right PCA infarct, no acute abnormalities. She had an EEG reported as normal awake and drowsy EEG. She saw her PCP and had an elevated Keppra level on Keppra 750mg  BID, dose was reduced to 500mg  BID. She denies any further episodes of left-sided twitching. She has a prescription for prn Ativan.   Epilepsy Risk Factors: Right-sided intracranial hemorrhage in 2012 s/p craniotomy. Otherwise she had a normal birth and early development. There is no history of febrile convulsions, CNS infections such as meningitis/encephalitis, or family history of seizures.  PAST MEDICAL HISTORY: Past Medical History:  Diagnosis Date  . 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  s/p revision  2/2 ERI 02/28/2011 - [facility] CD 3257  . Chronic systolic heart failure ([place])       . COPD (chronic obstructive pulmonary disease) ([place])    a. pt reports "scarring" at the lungs due to double PNA in the past.  . DM2  (diabetes mellitus, type 2) ([place])   . HLD (hyperlipidemia)   . Hypertension   . Hypokalemia   . ICD (implantable cardioverter-defibrillator) malfunction 03/22/2016  . LBBB (left bundle branch block)   . NICM (nonischemic cardiomyopathy) ([place])    a. Initial EF of 35%. b. Normal in 07/2009. c. 35% in 2012, 30% in 12/2011. Reported h/o catheterization done presumably before her ICD implanted at [facility] demonstrated some years ago no obstructive coronary disease. Neg nuc 12/2011.  [name] PAF (paroxysmal atrial fibrillation) ([place])    a. Not on anticoag due to h/o SDH.  . Seizures ([place])   . Subdural hematoma ([place])    a. 12/12. Hospitalized at [facility], requiring craniotomy, complicated by seizures.  . Vitamin D deficiency     MEDICATIONS: Current Outpatient Medications on File Prior to Visit  Medication Sig Dispense Refill  . albuterol (PROVENTIL HFA;VENTOLIN HFA) 108 (90 Base) MCG/ACT inhaler Inhale 1 puff into the lungs every 6 (six) hours as needed for wheezing or shortness of breath.    [name] albuterol (PROVENTIL) (2.5 MG/3ML) 0.083% nebulizer solution Take 2.5 mg by nebulization every 6 (six) hours as needed for wheezing or shortness of breath.     [name] alendronate (FOSAMAX) 70 MG tablet     . atorvastatin (LIPITOR) 40 MG tablet Take 40 mg by mouth daily at 6 PM.     . carvedilol (COREG) 25 MG tablet Take 25 mg by mouth 2 (two) times daily with a meal.      . furosemide (LASIX) 20 MG tablet Take 20 mg by mouth daily.    [name] lamoTRIgine (LAMICTAL) 25 MG tablet TAKE 1 TABLET TWICE DAILY 180 tablet 1  . levETIRAcetam (KEPPRA) 1000 MG tablet TAKE 1 AND 1/2 TABLETS TWICE DAILY 270 tablet 1  . LORazepam (ATIVAN) 1 MG tablet Take 2 mg by mouth every 8 (eight) hours as needed for seizure. Patient states she takes 2 tablets when needed.    . Multiple Vitamins-Calcium (ONE-A-DAY WOMENS PO) Take 1 tablet by mouth daily.     . nitroGLYCERIN (NITROSTAT) 0.4 MG SL tablet Place 0.4 mg under the tongue every 5 (five)  minutes as needed for chest pain (MAX 3 TABLETS).     [name] omeprazole (PRILOSEC) 40 MG capsule Take 40 mg by mouth 2 (two) times daily.     . potassium chloride (K-DUR) 10 MEQ tablet Take 20 mEq by mouth 2 (two) times daily. Take 2 tablets by mouth twice daily    . sitaGLIPtan (JANUVIA) 100 MG tablet Take 100 mg by mouth daily.       No current facility-administered medications on file prior to visit.     ALLERGIES: Allergies  Allergen Reactions  . Codeine Other (See Comments)    "puts me on a trip"  . Gemfibrozil Diarrhea  . Lactose Intolerance (Gi) Diarrhea  . Morphine And Related Other (See Comments)    unknown  . Valium Hives and Rash    FAMILY HISTORY: Family History  Problem Relation Age of Onset  . Heart disease Mother   . Heart disease Father   . Heart disease Unknown     SOCIAL HISTORY: Social History   Socioeconomic History  . Marital status: Married  Spouse name: Not on file  . Number of children: 5  . Years of education: Not on file  . Highest education level: Not on file  Occupational History  . Occupation: retired  Scientific laboratory technician  . Financial resource strain: Not on file  . Food insecurity:    Worry: Not on file    Inability: Not on file  . Transportation needs:    Medical: Not on file    Non-medical: Not on file  Tobacco Use  . Smoking status: Former Smoker    Types: Cigarettes    Last attempt to quit: 01/17/1968    Years since quitting: 50.0  . Smokeless tobacco: Never Used  Substance and Sexual Activity  . Alcohol use: No    Alcohol/week: 0.0 standard drinks  . Drug use: No  . Sexual activity: Not on file  Lifestyle  . Physical activity:    Days per week: Not on file    Minutes per session: Not on file  . Stress: Not on file  Relationships  . Social connections:    Talks on phone: Not on file    Gets together: Not on file    Attends religious service: Not on file    Active member of club or organization: Not on file    Attends meetings  of clubs or organizations: Not on file    Relationship status: Not on file  . Intimate partner violence:    Fear of current or ex partner: Not on file    Emotionally abused: Not on file    Physically abused: Not on file    Forced sexual activity: Not on file  Other Topics Concern  . Not on file  Social History Narrative   Registration notes - ICD = St. Jude    REVIEW OF SYSTEMS: Constitutional: No fevers, chills, or sweats, + generalized fatigue, change in appetite Eyes: No visual changes, double vision, eye pain Ear, nose and throat: No hearing loss, ear pain, nasal congestion, sore throat Cardiovascular: No chest pain, palpitations Respiratory:  No shortness of breath at rest or with exertion, wheezes GastrointestinaI: No nausea, vomiting, diarrhea, abdominal pain, fecal incontinence Genitourinary:  No dysuria, urinary retention or frequency Musculoskeletal:  No neck pain,+ back pain Integumentary: No rash, pruritus, skin lesions Neurological: as above Psychiatric: No depression, insomnia, anxiety Endocrine: No palpitations, fatigue, diaphoresis, mood swings, change in appetite, change in weight, increased thirst Hematologic/Lymphatic:  No anemia, purpura, petechiae. Allergic/Immunologic: no itchy/runny eyes, nasal congestion, recent allergic reactions, rashes  PHYSICAL EXAM: Vitals:   01/10/18 0840  BP: 138/70  Pulse: 82  SpO2: 98%   General: No acute distress, on O2 via [place]:  Normocephalic/atraumatic Neck: supple, no paraspinal tenderness, full range of motion Heart:  Regular rate and rhythm Lungs:  Clear to auscultation bilaterally Back: No paraspinal tenderness Skin/Extremities: No rash, no edema Neurological Exam: alert and oriented to person, place, and time. No aphasia or dysarthria. Fund of knowledge is appropriate.  Recent and remote memory are intact.  Attention and concentration are normal.    Able to name objects and repeat phrases. Cranial nerves: Pupils  equal, round, reactive to light.  Extraocular movements intact with no nystagmus. Visual fields full. Facial sensation intact. No facial asymmetry. Tongue, uvula, palate midline.  Motor: Bulk and tone normal, muscle strength 5/5 throughout with no pronator drift.  Sensation to light touch intact.  No extinction to double simultaneous stimulation.Finger to nose testing intact.  Gait slow and cautious without walker, no ataxia.  IMPRESSION: This is a 77 yo RH woman with a history of hypertension, hyperlipidemia, diabetes, cardiomyopathy s/p ICD placement, paroxysmal atrial fibrillation off anticoagulation due to intracranial bleed in 2012 with subsequent seizures. She had been seizure-free for 3 years until she had a breakthrough simple partial seizure with left-sided twitching in May 2016. EEG normal, head CT no acute changes. No further episodes of left-sided twitching, however she was having simple partial sensory seizures with numbness that travels up her left side on Levetiracetam 1500mg  BID. These improved with addition of low dose Lamotrigine 25mg  BID, however she has had a few breakthrough seizures in the setting of respiratory illness. We discussed increasing lamotrigine to 50mg  BID, continue Levetiracetam 1500mg  BID. She has prn Ativan for rescue but misunderstood instructions and had been taking it daily the past few days, instructions were clarified to only take as needed 1-2 times a week if possible. Hopefully increasing lamotrigine dose with quiet down seizures, if not we can increase dose further. She is aware of [place] driving laws to stop driving after a seizure, until 6 months seizure-free. She will follow-up in 6 months and knows to call for any changes.   Thank you for allowing me to participate in her care.  Please do not hesitate to call for any questions or concerns.  The duration of this appointment visit was 30 minutes of face-to-face time with the patient.  Greater than 50% of this time  was spent in counseling, explanation of diagnosis, planning of further management, and coordination of care.   Ellouise Newer, M.D.   CC: Dr. Jannette Fogo

## 2018-02-26 ENCOUNTER — Telehealth: Payer: Self-pay | Admitting: Internal Medicine

## 2018-02-26 NOTE — Telephone Encounter (Signed)
New message   Melissa from [facility] is calling because The PT told her the cardiologist stopped the medication Entresto and she has low heart function and they want to start the medication back but would like cardiologist approval  Please call

## 2018-02-27 NOTE — Telephone Encounter (Signed)
Followed up with Melissa from Dr Jerrye Bushy office in [place]. She states pt's recent echo from 2/6 showed decreased EF of 25-30%. Her last creatinine 1.47, improved from December at 1.8. At that time, Dr Caryl Comes discontinued her Delene Loll.  Dr Milly Jakob would like Dr Olin Pia input on whether or not to restart Entresto. I advised Melissa that Dr Caryl Comes is currently out of the office, but I will discuss when he returns early next week.   Lenna Sciara is also going to fax pt's recent echo and BMP for review.

## 2018-03-05 NOTE — Telephone Encounter (Signed)
Per Dr Caryl Comes, Dr Milly Jakob may want to start with hydralazine/nitrates (Bidil) since pt's creatinine became acutely elevated after beginning Entresto. Melissa from Dr Jerrye Bushy office stated she would message Dr Milly Jakob regarding this and call me back with their decision.

## 2018-03-11 NOTE — Telephone Encounter (Signed)
noted 

## 2018-08-08 ENCOUNTER — Encounter: Payer: Self-pay | Admitting: Neurology

## 2018-08-08 ENCOUNTER — Telehealth (INDEPENDENT_AMBULATORY_CARE_PROVIDER_SITE_OTHER): Payer: Medicare HMO | Admitting: Neurology

## 2018-08-08 ENCOUNTER — Other Ambulatory Visit: Payer: Self-pay

## 2018-08-08 DIAGNOSIS — G40109 Localization-related (focal) (partial) symptomatic epilepsy and epileptic syndromes with simple partial seizures, not intractable, without status epilepticus: Secondary | ICD-10-CM

## 2018-08-08 MED ORDER — LAMOTRIGINE 25 MG PO TABS: ORAL_TABLET | ORAL | 3 refills | Status: DC

## 2018-08-08 MED ORDER — LORAZEPAM 1 MG PO TABS: ORAL_TABLET | ORAL | 5 refills | Status: DC

## 2018-08-08 MED ORDER — LEVETIRACETAM 1000 MG PO TABS: ORAL_TABLET | ORAL | 3 refills | Status: DC

## 2018-08-08 NOTE — Progress Notes (Signed)
Virtual Visit via Telephone Note The purpose of this virtual visit is to provide medical care while limiting exposure to the novel coronavirus.    Consent was obtained for phone visit:  Yes.   Answered questions that patient had about telehealth interaction:  Yes.   I discussed the limitations, risks, security and privacy concerns of performing an evaluation and management service by telephone. I also discussed with the patient that there may be a patient responsible charge related to this service. The patient expressed understanding and agreed to proceed.  Pt location: Home Physician Location: office Name of referring provider:  Irven Shelling, MD I connected with .[name] at patients initiation/request on 08/08/2018 at  8:30 AM EDT by telephone and verified that I am speaking with the correct person using two identifiers.  Pt MRN:  [id number] Pt DOB:  1940/12/26   History of Present Illness:  The patient had a phone visit on 08/08/2018. She was last seen in the neurology clinic 7 months ago for seizures. She has a history of craniotomy for right epidural hematoma. She continued to report simple partial seizures with left foot numbness traveling up the left side of her body while on Levetiracetam 1500mg  BID, this improved with addition of low dose Lamotrigine 25mg  BID in February 2018. Since her last visit, she reports one typical episode 2 nights ago where her left leg got numb and shook a little. She called family and took 2mg  Ativan which helped. She has only needed it this one time. She feels this was triggered by overworking herself, she may have done too much that day. No missed medications. She has headaches every now and then, no nausea/vomiting. She has rare dizziness and gets off balance sometimes. No falls. She denies any diplopia. No side effects on medications.   History on Initial Assessment 07/28/2014: This is a 78 yo RH woman with a history of cardiomyopathy s/p ICD  placement, hypertension, hyperlipidemia, diabetes, paroxysmal atrial fibrillation, who had a fall in December 2012, found to have multiple right extraaxial intracranial hemorrhages and INR >20 while on Coumadin at that time. She had a witnessed seizure in [place] then transferred to [facility] where she had a right craniotomy of evacuation of epidural hematoma. She had another seizure after the surgery. She was discharged home on Keppra. She had an admission in May 2013 for recurrent seizures, there is note that she was being weaned off seizure medication, then had a seizure and Keppra was restarted. She was reported to have twitching. I personally reviewed head CT done at that time which showed right frontal craniotomy, no residual fluid collection. Remote cortical infarcts/contusions in the right and left occipital regions. She denies any further seizures until 05/24/14 when she started having uncontrollable twitching on the left side of her face and left arm per ER notes. She tells me today that her whole body was shaking. She was awake and alert, able to communicate, denies confusion. She denied focal weakness after. She was given IV Ativan with resolution of twitching. She had a head CT (images unavailable for review) which showed post-surgical right frontal craniotomy changes, chronic right PCA infarct, no acute abnormalities. She had an EEG reported as normal awake and drowsy EEG. She saw her PCP and had an elevated Keppra level on Keppra 750mg  BID, dose was reduced to 500mg  BID. She denies any further episodes of left-sided twitching. She has a prescription for prn Ativan.   Epilepsy Risk Factors: Right-sided intracranial hemorrhage  in 2012 s/p craniotomy. Otherwise she had a normal birth and early development. There is no history of febrile convulsions, CNS infections such as meningitis/encephalitis, or family history of seizures.  Outpatient Encounter Medications as of 08/08/2018   Medication Sig  . albuterol (PROVENTIL HFA;VENTOLIN HFA) 108 (90 Base) MCG/ACT inhaler Inhale 1 puff into the lungs every 6 (six) hours as needed for wheezing or shortness of breath.  [name] albuterol (PROVENTIL) (2.5 MG/3ML) 0.083% nebulizer solution Take 2.5 mg by nebulization every 6 (six) hours as needed for wheezing or shortness of breath.   [name] alendronate (FOSAMAX) 70 MG tablet Take 70 mg by mouth once a week.   [name] atorvastatin (LIPITOR) 40 MG tablet Take 40 mg by mouth daily at 6 PM.   . carvedilol (COREG) 25 MG tablet Take 25 mg by mouth 2 (two) times daily with a meal.    . furosemide (LASIX) 20 MG tablet Take 20 mg by mouth daily. Taking 1.5 tablet daily  . lamoTRIgine (LAMICTAL) 25 MG tablet Take 2 tablets twice a day (Patient taking differently: Take 25 mg by mouth 2 (two) times daily. Take 1 tablet twice a day)  . levETIRAcetam (KEPPRA) 1000 MG tablet Take 1.5 tablets twice a day  . nitroGLYCERIN (NITROSTAT) 0.4 MG SL tablet Place 0.4 mg under the tongue every 5 (five) minutes as needed for chest pain (MAX 3 TABLETS).   [name] omeprazole (PRILOSEC) 40 MG capsule Take 40 mg by mouth 2 (two) times daily.   . OXYGEN Inhale 2 L into the lungs daily.  . potassium chloride (K-DUR) 10 MEQ tablet Take 20 mEq by mouth 2 (two) times daily. Take 2 tablets by mouth twice daily  . sacubitril-valsartan (ENTRESTO) 24-26 MG Take 1 tablet by mouth 2 (two) times daily.  . sitaGLIPtan (JANUVIA) 100 MG tablet Take 100 mg by mouth daily.    .    .     No facility-administered encounter medications on file as of 08/08/2018.     Observations/Objective:  Limited due to nature of phone visit. She is awake, alert, able to answer questions without dysarthria or confusion.   Assessment and Plan:   This is a 78 yo RH woman with a history of hypertension, hyperlipidemia, diabetes, cardiomyopathy s/p ICD placement, paroxysmal atrial fibrillation off anticoagulation due to intracranial bleed in 2012 with subsequent  seizures. She had been seizure-free for 3 years until she had a breakthrough simple partial seizure with left-sided twitching in May 2016. EEG normal, head CT no acute changes. She reports one episode of a focal seizure with left leg numbness and twitching 2 days ago. Increase Lamotrigine to 50mg  BID, continue Levetiracetam 1500mg  BID. She has prn lorazepam 1mg  1-2 tabs that she takes for seizure. She is aware of [place] driving laws to stop driving after a seizure, until 6 months seizure-free. She will follow-up in 6 months and knows to call for any changes.   Follow Up Instructions:   -I discussed the assessment and treatment plan with the patient. The patient was provided an opportunity to ask questions and all were answered. The patient agreed with the plan and demonstrated an understanding of the instructions.   The patient was advised to call back or seek an in-person evaluation if the symptoms worsen or if the condition fails to improve as anticipated.    Total Time spent in visit with the patient was 9 minutes, of which 100% of the time was spent in counseling and/or coordinating care on the above.  Pt understands and agrees with the plan of care outlined.     Cameron Sprang, MD

## 2019-01-12 NOTE — Progress Notes (Deleted)
Cardiology Office Note Date:  01/12/2019  Patient ID:  [name], [name] 1940-02-01, MRN [id number] PCP:  Bonnita Nasuti, MD  Electrophysiologist:  Dr. Caryl Comes   Chief Complaint: ***   History of Present Illness: [name] is a 78 y.o. 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    2017 Dr. Caryl Comes noted increased DOE and decreased exertional capacity, at that time, his note states that her RV pacing lead was turned off (though her LV lead is off) with narrower QRS, he increased the diuretic gently with plans to have her return in about 4 weeks to reassess, may need repeat echo. Dr. Caryl Comes notes: There has been intercurrent but transient normalization of left ventricular systolic function by an echo summer 2012 demonstrating an ejection fraction of 55%-60%. Repeat echo12/12 EF 35% AV optimization echo 12/13 done.  Chest x-ray demonstrates high anterior location Ef 30 % with global hypokinesis Myoview>>Normal perfusion;  Repeat Myoview 3/16 demonstrated normalization of LV function. Echo confirmed this. Catheterization done presumably before her ICD implanted at [facility] demonstrated some years ago no obstructive coronary disease  I saw her in follow up in 2017 a couple visit her SOB suspect to be mostly her COPD.  She was noted to have seconds of AFib episodes, given recurrent seizures and brief episodes, not re-started on a/c We had previously discussed her hx of AFib and she tells me she would not be inclined to consider anticoagulation. And that since her last visit she has had 2 seizures was pending her f/u with neurology.  Most recently she saw Dr. Caryl Comes dec 2019, he noted COD now requiring home O2, her functional status limmited by this.  No changes were made, planned for BMET given her  Entresto   *** symptoms *** volume status *** COPD *** labs, lipids *** meds, HF    Device history: SJM CRT-D, implanted 10/10/04 generator replacement in Feb 2013 with insertion of new ICD lead 2/2 externalization of the previous RIATA lead  Subsequent device EOS and abandoned given near normalization of her EF    Past Medical History:  Diagnosis Date  . 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  s/p revision 2/2 ERI 02/28/2011 - SJM CD 3257  . Chronic systolic heart failure ([place])       . COPD (chronic obstructive pulmonary disease) ([place])    a. pt reports "scarring" at the lungs due to double PNA in the past.  . DM2 (diabetes mellitus, type 2) ([place])   . HLD (hyperlipidemia)   . Hypertension   . Hypokalemia   . ICD (implantable cardioverter-defibrillator) malfunction 03/22/2016  . LBBB (left bundle branch block)   . NICM (nonischemic cardiomyopathy) ([place])    a. Initial EF of 35%. b. Normal in 07/2009. c. 35% in 2012, 30% in 12/2011. Reported h/o catheterization done presumably before her ICD implanted at [facility] demonstrated some years ago no obstructive coronary disease. Neg nuc 12/2011.  [name] PAF (paroxysmal atrial fibrillation) ([place])    a. Not on anticoag due to h/o SDH.  . Seizures ([place])   . Subdural hematoma ([place])    a. 12/12. Hospitalized at [facility], requiring craniotomy, complicated by seizures.  . Vitamin D deficiency     Past Surgical History:  Procedure Laterality Date  . BRAIN  HEMATOMA EVACUATION    . CARDIAC CATHETERIZATION     no significant CAD  . CARDIAC DEFIBRILLATOR PLACEMENT  2006  . CHOLECYSTECTOMY    . IMPLANTABLE CARDIOVERTER DEFIBRILLATOR (ICD) GENERATOR CHANGE N/A 03/01/2011   Procedure: ICD GENERATOR CHANGE;  Surgeon: Deboraha Sprang, MD;  Location[facility] C[facility] LAB;  Service: Cardiovascular;  Laterality: N/A;  . TOTAL ABDOMINAL HYSTERECTOMY  1983    Current Outpatient Medications  Medication Sig Dispense Refill  .  albuterol (PROVENTIL HFA;VENTOLIN HFA) 108 (90 Base) MCG/ACT inhaler Inhale 1 puff into the lungs every 6 (six) hours as needed for wheezing or shortness of breath.    [name] albuterol (PROVENTIL) (2.5 MG/3ML) 0.083% nebulizer solution Take 2.5 mg by nebulization every 6 (six) hours as needed for wheezing or shortness of breath.     [name] alendronate (FOSAMAX) 70 MG tablet Take 70 mg by mouth once a week.     [name] atorvastatin (LIPITOR) 40 MG tablet Take 40 mg by mouth daily at 6 PM.     . carvedilol (COREG) 25 MG tablet Take 25 mg by mouth 2 (two) times daily with a meal.      . furosemide (LASIX) 20 MG tablet Take 20 mg by mouth daily. Taking 1.5 tablet daily    . lamoTRIgine (LAMICTAL) 25 MG tablet Take 2 tablets twice a day 360 tablet 3  . levETIRAcetam (KEPPRA) 1000 MG tablet Take 1.5 tablets twice a day 270 tablet 3  . LORazepam (ATIVAN) 1 MG tablet Take 1-2 tablets as needed for seizure. 10 tablet 5  . nitroGLYCERIN (NITROSTAT) 0.4 MG SL tablet Place 0.4 mg under the tongue every 5 (five) minutes as needed for chest pain (MAX 3 TABLETS).     [name] omeprazole (PRILOSEC) 40 MG capsule Take 40 mg by mouth 2 (two) times daily.     . OXYGEN Inhale 2 L into the lungs daily.    . potassium chloride (K-DUR) 10 MEQ tablet Take 20 mEq by mouth 2 (two) times daily. Take 2 tablets by mouth twice daily    . sacubitril-valsartan (ENTRESTO) 24-26 MG Take 1 tablet by mouth 2 (two) times daily.    . sitaGLIPtan (JANUVIA) 100 MG tablet Take 100 mg by mouth daily.       No current facility-administered medications for this visit.    Allergies:   Codeine, Gemfibrozil, Lactose intolerance (gi), Morphine and related, and Valium   Social History:  The patient  reports that she quit smoking about 51 years ago. Her smoking use included cigarettes. She has never used smokeless tobacco. She reports that she does not drink alcohol or use drugs.   Family History:  The patient's family history includes Heart disease in her father,  mother, and another family member.  ROS:  Please see the history of present illness.    All other systems are reviewed and otherwise negative.   PHYSICAL EXAM:  VS:  There were no vitals taken for this visit. BMI: There is no height or weight on file to calculate BMI. Well nourished, well developed, in no acute distress  HEENT: normocephalic, atraumatic  Neck: no JVD, carotid bruits or masses Cardiac:  *** RRR; no significant murmurs, no rubs, or gallops Lungs:  *** CTA b/l, no wheezing, rhonchi or rales  Abd: soft, nontender MS: no deformity or *** atrophy Ext: *** no edema is appreciated  Skin: warm and dry, no rash Neuro:  No gross deficits appreciated Psych: euthymic mood, full affect  *** ICD site is  stable, no tethering or discomfort    EKG:  ***  ICD interrogation today and revuewed by myself ***   08/27/15: TTE Study Conclusions - Left ventricle: The cavity size was normal. Systolic function was   mildly reduced. The estimated ejection fraction was in the range   of 45% to 50%. Images were inadequate for LV wall motion   assessment. There was an increased relative contribution of   atrial contraction to ventricular filling. Doppler parameters are   consistent with abnormal left ventricular relaxation (grade 1   diastolic dysfunction). Doppler parameters are consistent with   high ventricular filling pressure. - Ventricular septum: Septal motion showed moderate paradox. These   changes are consistent with right ventricular pacing. - Mitral valve: Calcified annulus. - Line: A venous catheter was visualized in the superior vena cava,   with its tip in the right atrium. No abnormal features noted.  04/13/14: Echocardiogram Study Conclusions - Left ventricle: The cavity size was normal. Wall thickness was increased in a pattern of mild LVH. Systolic function was normal. The estimated ejection fraction was in the range of 55% to 60%. Wall motion was normal;  there were no regional wall motion abnormalities. Doppler parameters are consistent with abnormal left ventricular relaxation (grade 1 diastolic dysfunction). The E/e&' ratio is between 8-15, suggesting indeterminate LV filling pressure. - Mitral valve: Calcified annulus. - Left atrium: The atrium was normal in size. - Right ventricle: The cavity size was normal. Wall thickness was normal. Pacer wire or catheter noted in right ventricle. Systolic function was normal. - Right atrium: The atrium was normal in size. Pacer wire or catheter noted in right atrium. - Tricuspid valve: There was mild regurgitation. - Pulmonary arteries: PA peak pressure: 24 mm Hg (S). - Inferior vena cava: The vessel was normal in size. The respirophasic diameter changes were in the normal range (= 50%), consistent with normal central venous pressure. Impressions: - Compared to the prior echo in 2013, the EF has normalized. AICD wires are noted. RVSP has improved from 36 mmHg to 24 mmHg.  Recent Labs: No results found for requested labs within last 8760 hours.  No results found for requested labs within last 8760 hours.   CrCl cannot be calculated (Patient's most recent lab result is older than the maximum 21 days allowed.).   Wt Readings from Last 3 Encounters:  08/08/18 163 lb (73.9 kg)  01/10/18 166 lb (75.3 kg)  12/21/17 166 lb (75.3 kg)     Other studies reviewed: Additional studies/records reviewed today include:   ASSESSMENT AND PLAN:  1. NICM, *** last echo with normalized EF     ***    2. CRT-D, programmed RV only (not BiVe), V pacing <1%     Has reached EODS and abandoned    3. PAF     CHA2DS2Vasc is at least 6, off a/c after fall with SDH (2012), with subsequent and recurrent seizures      the patient has previously stated she would likely not be agreeable to a/c going forward      Device EOS, unknown burden, *** symptoms  4. HTN     ***  Stable  5.  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  The patient did not have any concerns regarding medicines.  Haywood Lasso, PA-C 01/12/2019 4:12 PM  [address] [place] [address] [phone] (office)  [phone] (fax)

## 2019-01-14 ENCOUNTER — Encounter: Payer: Medicare HMO | Admitting: Physician Assistant

## 2019-01-30 ENCOUNTER — Encounter: Payer: Medicare HMO | Admitting: Internal Medicine

## 2019-02-28 ENCOUNTER — Encounter: Payer: Self-pay | Admitting: Internal Medicine

## 2019-03-05 ENCOUNTER — Ambulatory Visit: Payer: Medicare HMO | Admitting: Internal Medicine

## 2019-03-05 ENCOUNTER — Other Ambulatory Visit: Payer: Self-pay

## 2019-03-05 ENCOUNTER — Encounter: Payer: Self-pay | Admitting: Internal Medicine

## 2019-03-05 VITALS — BP 118/62 | HR 75 | Ht 64.5 in | Wt 163.4 lb

## 2019-03-05 DIAGNOSIS — I5022 Chronic systolic (congestive) heart failure: Secondary | ICD-10-CM

## 2019-03-05 DIAGNOSIS — Z95810 Presence of automatic (implantable) cardiac defibrillator: Secondary | ICD-10-CM

## 2019-03-05 DIAGNOSIS — I428 Other cardiomyopathies: Secondary | ICD-10-CM

## 2019-03-05 NOTE — Progress Notes (Signed)
Patient Care Team: Hague, Rosalyn Charters, MD as PCP - General (Internal Medicine)   HPI  [name] is a 79 y.o. female seen in followup for CRT-D implanted at [facility] for nonischemic cardiomyopathy.She underwent CRT device generator replacement   She was hospitalized March 2018 for inappropriate shocks secondary to lead failure. She had a Riata lead with known externalization. High-voltage therapies were inactivated.    Her device has  reached ERI and has been abandoned   Chronic shortness of breath.  Oxygen dependent COPD.  No edema.  Denies chest pain.      DATE TEST    12/12 Echo    EF 35 %   8/17 Echo    EF 45-50 %   4/18 Echo  EF 40--45%    Date Cr K Hgb  7/18  0.94 4.5 12.1  12/19  1.8<<2.15 4.5           Past Medical History:  Diagnosis Date  . 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  s/p revision 2/2 ERI 02/28/2011 - [facility]JM CD 3257  . Chronic systolic heart failure ([place])       . COPD (chronic obstructive pulmonary disease) ([place])    a. pt reports "scarring" at the lungs due to double PNA in the past.  . DM2 (diabetes mellitus, type 2) ([place])   . HLD (hyperlipidemia)   . Hypertension   . Hypokalemia   . ICD (implantable cardioverter-defibrillator) malfunction 03/22/2016  . LBBB (left bundle branch block)   . NICM (nonischemic cardiomyopathy) ([place])    a. Initial EF of 35%. b. Normal in 07/2009. c. 35% in 2012, 30% in 12/2011. Reported h/o catheterization done presumably before her ICD implanted at [facility] demonstrated some years ago no obstructive coronary disease. Neg nuc 12/2011.  [name] PAF (paroxysmal atrial fibrillation) ([place])    a. Not on anticoag due to h/o SDH.  . Seizures ([place])   . Subdural hematoma ([place])    a. 12/12. Hospitalized at [facility], requiring craniotomy, complicated by seizures.  . Vitamin D deficiency     Past Surgical History:  Procedure Laterality Date  . BRAIN HEMATOMA EVACUATION    . CARDIAC  CATHETERIZATION     no significant CAD  . CARDIAC DEFIBRILLATOR PLACEMENT  2006  . CHOLECYSTECTOMY    . IMPLANTABLE CARDIOVERTER DEFIBRILLATOR (ICD) GENERATOR CHANGE N/A 03/01/2011   Procedure: ICD GENERATOR CHANGE;  Surgeon: Deboraha Sprang, MD;  Location: [facility] CATH LAB;  Service: Cardiovascular;  Laterality: N/A;  . TOTAL ABDOMINAL HYSTERECTOMY  1983    Current Outpatient Medications  Medication Sig Dispense Refill  . albuterol (PROVENTIL) (2.5 MG/3ML) 0.083% nebulizer solution Take 2.5 mg by nebulization every 6 (six) hours as needed for wheezing or shortness of breath.     [name] alendronate (FOSAMAX) 70 MG tablet Take 70 mg by mouth once a week.     [name] atorvastatin (LIPITOR) 40 MG tablet Take 40 mg by mouth daily at 6 PM.     . carvedilol (COREG) 25 MG tablet Take 25 mg by mouth 2 (two) times daily with a meal.      . furosemide (LASIX) 20 MG tablet Take 20 mg by mouth daily.     [name] lamoTRIgine (LAMICTAL) 25 MG tablet Take 2 tablets twice a day 360 tablet 3  . levETIRAcetam (KEPPRA) 1000 MG tablet Take 1.5 tablets twice a day 270 tablet 3  . LORazepam (ATIVAN) 1 MG tablet Take 1-2 tablets  as needed for seizure. 10 tablet 5  . nitroGLYCERIN (NITROSTAT) 0.4 MG SL tablet Place 0.4 mg under the tongue every 5 (five) minutes as needed for chest pain (MAX 3 TABLETS).     [name] omeprazole (PRILOSEC) 40 MG capsule Take 40 mg by mouth 2 (two) times daily.     . OXYGEN Inhale 2 L into the lungs daily.    . potassium chloride (K-DUR) 10 MEQ tablet Take 20 mEq by mouth 2 (two) times daily. Take 2 tablets by mouth twice daily    . sacubitril-valsartan (ENTRESTO) 24-26 MG Take 1 tablet by mouth 2 (two) times daily.    . sitaGLIPtan (JANUVIA) 100 MG tablet Take 100 mg by mouth daily.       No current facility-administered medications for this visit.    Allergies  Allergen Reactions  . Codeine Other (See Comments)    "puts me on a trip"  . Gemfibrozil Diarrhea  . Lactose Intolerance (Gi) Diarrhea  .  Morphine And Related Other (See Comments)    unknown  . Valium Hives and Rash    Review of Systems negative except from HPI and PMH  Physical Exam BP 118/62   Pulse 75   Ht 5' 4.5" (1.638 m)   Wt 163 lb 6.4 oz (74.1 kg)   SpO2 98%   BMI 27.61 kg/m  Well developed and nourished in no acute distress wearing oxygen HENT normal Neck supple with JVP-  Flat  Clear Regular rate and rhythm, no murmurs or gallops Abd-soft with active BS No Clubbing cyanosis edema Skin-warm and dry A & Oriented  Grossly normal sensory and motor function  ECG sinus rhythm at 75 Interval 15/13/38 Right axis deviation at 118 IVCD-left bundle branch block   Assessment and  Plan  Congestive heart failure- /chronic-systolic  Nonischemic cardiomyopathy interval near normalization  Oxygen dependent COPD  Renal insufficiency  Gr 3-4  CRT D.-St. Jude's at EOS not replaced   Euvolemic continue current meds  Concerned about her renal insufficiency given her ongoing Entresto.  Surveillance labs last are available to [place] from 12/19.  She is to see her primary care physician next week.  I have asked her to arrange to have labs faxed to [place] thereafter.  Her dyspnea may be associated with desaturation with ambulation.  Have asked her to have her PCP next week walker with her pulse oximeter on to see if there may be a role for increasing her oxygen placement while she is awake and walking.

## 2019-03-05 NOTE — Patient Instructions (Signed)
Medication Instructions:  Your physician recommends that you continue on your current medications as directed. Please refer to the Current Medication list given to you today.  Labwork: Please have your PCP fax [place] your labwork to [phone]  Testing/Procedures: None ordered.  Follow-Up: Your physician wants you to follow-up in: one year with Dr Gari Crown will receive a reminder letter in the mail two months in advance. If you don't receive a letter, please call our office to schedule the follow-up appointment.  Remote monitoring is used to monitor your Pacemaker of ICD from home. This monitoring reduces the number of office visits required to check your device to one time per year. It allows [place] to keep an eye on the functioning of your device to ensure it is working properly.   Any Other Special Instructions Will Be Listed Below (If Applicable).  If you need a refill on your cardiac medications before your next appointment, please call your pharmacy.

## 2019-03-07 ENCOUNTER — Encounter: Payer: Self-pay | Admitting: Neurology

## 2019-03-10 ENCOUNTER — Encounter: Payer: Self-pay | Admitting: Neurology

## 2019-03-10 ENCOUNTER — Other Ambulatory Visit: Payer: Self-pay

## 2019-03-10 ENCOUNTER — Telehealth (INDEPENDENT_AMBULATORY_CARE_PROVIDER_SITE_OTHER): Payer: Medicare HMO | Admitting: Neurology

## 2019-03-10 DIAGNOSIS — G40109 Localization-related (focal) (partial) symptomatic epilepsy and epileptic syndromes with simple partial seizures, not intractable, without status epilepticus: Secondary | ICD-10-CM

## 2019-03-10 MED ORDER — LAMOTRIGINE 25 MG PO TABS: ORAL_TABLET | ORAL | 3 refills | Status: DC

## 2019-03-10 MED ORDER — LEVETIRACETAM 1000 MG PO TABS: ORAL_TABLET | ORAL | 3 refills | Status: DC

## 2019-03-10 NOTE — Progress Notes (Signed)
Telephone (Audio) Visit The purpose of this telephone visit is to provide medical care while limiting exposure to the novel coronavirus.    Consent was obtained for telephone visit:  Yes.   Answered questions that patient had about telehealth interaction:  Yes.   I discussed the limitations, risks, security and privacy concerns of performing an evaluation and management service by telephone. I also discussed with the patient that there may be a patient responsible charge related to this service. The patient expressed understanding and agreed to proceed.  Pt location: Home Physician Location: office Name of referring provider:  Bonnita Nasuti, MD I connected with .[name] at patients initiation/request on 03/10/2019 at 80:00 AM EST by telephone and verified that I am speaking with the correct person using two identifiers.  Pt MRN:  [id number] Pt DOB:  March 20, 1940   History of Present Illness:  The patient had a telephone visit on 03/10/2019. She was last evaluated in the neurology clinic also by telephonic communication 7 months ago for seizures. On her last visit, she continued to report simple partial seizures with left foot numbness and slight shaking. Lamotrigine dose increased to 50mg  BID, she is also on Levetiracetam 1500mg  BID. No further episodes since 07/2018. She denies any staring/unresponsive episodes, gaps in time, no headaches, dizziness, no falls. No side effects on medications. Mood is good.   History on Initial Assessment 07/28/2014: This is a 79 yo RH woman with a history of cardiomyopathy s/p ICD placement, hypertension, hyperlipidemia, diabetes, paroxysmal atrial fibrillation, who had a fall in December 2012, found to have multiple right extraaxial intracranial hemorrhages and INR >20 while on Coumadin at that time. She had a witnessed seizure in [place] then transferred to [facility] where she had a right craniotomy of evacuation of epidural hematoma. She  had another seizure after the surgery. She was discharged home on Keppra. She had an admission in May 2013 for recurrent seizures, there is note that she was being weaned off seizure medication, then had a seizure and Keppra was restarted. She was reported to have twitching. I personally reviewed head CT done at that time which showed right frontal craniotomy, no residual fluid collection. Remote cortical infarcts/contusions in the right and left occipital regions. She denies any further seizures until 05/24/14 when she started having uncontrollable twitching on the left side of her face and left arm per ER notes. She tells me today that her whole body was shaking. She was awake and alert, able to communicate, denies confusion. She denied focal weakness after. She was given IV Ativan with resolution of twitching. She had a head CT (images unavailable for review) which showed post-surgical right frontal craniotomy changes, chronic right PCA infarct, no acute abnormalities. She had an EEG reported as normal awake and drowsy EEG. She saw her PCP and had an elevated Keppra level on Keppra 750mg  BID, dose was reduced to 500mg  BID. She denies any further episodes of left-sided twitching. She has a prescription for prn Ativan.   Epilepsy Risk Factors: Right-sided intracranial hemorrhage in 2012 s/p craniotomy. Otherwise she had a normal birth and early development. There is no history of febrile convulsions, CNS infections such as meningitis/encephalitis, or family history of seizures.  Outpatient Encounter Medications as of 08/08/2018  Medication Sig  . albuterol (PROVENTIL HFA;VENTOLIN HFA) 108 (90 Base) MCG/ACT inhaler Inhale 1 puff into the lungs every 6 (six) hours as needed for wheezing or shortness of breath.  [name] albuterol (PROVENTIL) (2.5 MG/3ML)  0.083% nebulizer solution Take 2.5 mg by nebulization every 6 (six) hours as needed for wheezing or shortness of breath.   [name] alendronate (FOSAMAX) 70 MG tablet Take  70 mg by mouth once a week.   [name] atorvastatin (LIPITOR) 40 MG tablet Take 40 mg by mouth daily at 6 PM.   . carvedilol (COREG) 25 MG tablet Take 25 mg by mouth 2 (two) times daily with a meal.    . furosemide (LASIX) 20 MG tablet Take 20 mg by mouth daily. Taking 1.5 tablet daily  . lamoTRIgine (LAMICTAL) 25 MG tablet Take 2 tablets twice a day   . levETIRAcetam (KEPPRA) 1000 MG tablet Take 1.5 tablets twice a day  . nitroGLYCERIN (NITROSTAT) 0.4 MG SL tablet Place 0.4 mg under the tongue every 5 (five) minutes as needed for chest pain (MAX 3 TABLETS).   [name] omeprazole (PRILOSEC) 40 MG capsule Take 40 mg by mouth 2 (two) times daily.   . OXYGEN Inhale 2 L into the lungs daily.  . potassium chloride (K-DUR) 10 MEQ tablet Take 20 mEq by mouth 2 (two) times daily. Take 2 tablets by mouth twice daily  . sacubitril-valsartan (ENTRESTO) 24-26 MG Take 1 tablet by mouth 2 (two) times daily.  . sitaGLIPtan (JANUVIA) 100 MG tablet Take 100 mg by mouth daily.    .    .     No facility-administered encounter medications on file as of 08/08/2018.      Observations/Objective:   Vitals:   03/07/19 1623  Weight: 163 lb (73.9 kg)  Height: 5' 4.5" (1.638 m)   Exam limited due to nature of phone visit. Patient is awake, alert,able to answer questions without dysarthria or confusion.   Assessment and Plan:   This is a 79 yo RH woman with a history of hypertension, hyperlipidemia, diabetes, cardiomyopathy s/p ICD placement, paroxysmal atrial fibrillation off anticoagulation due to intracranial bleed in 2012 with subsequent seizures. She had been seizure-free for 3 years until she had a breakthrough simple partial seizure with left-sided twitching in May 2016. EEG normal, head CT no acute changes. Last episode was in July 2020, Lamotrigine increased at that time to 50mg  BID, she is also on Levetiracetam 1500mg  BID. No further seizures since increase in Lamotrigine. no side effects. She has prn lorazepam 1mg  1-2  tabs that she takes for seizure. She is aware of [place] driving laws to stop driving after a seizure, until 6 months seizure-free. She will follow-up in 6 months and knows to call for any changes.    Follow Up Instructions:   -I discussed the assessment and treatment plan with the patient. The patient was provided an opportunity to ask questions and all were answered. The patient agreed with the plan and demonstrated an understanding of the instructions.   The patient was advised to call back or seek an in-person evaluation if the symptoms worsen or if the condition fails to improve as anticipated.    Total Time spent in visit with the patient was:  5 minutes, of which 100% of the time was spent in counseling and/or coordinating care on the above.   Pt understands and agrees with the plan of care outlined.     Cameron Sprang, MD

## 2019-04-29 ENCOUNTER — Other Ambulatory Visit: Payer: Self-pay | Admitting: Neurology

## 2019-10-10 ENCOUNTER — Other Ambulatory Visit: Payer: Self-pay

## 2019-10-10 ENCOUNTER — Ambulatory Visit: Payer: Medicare HMO | Admitting: Neurology

## 2019-10-10 ENCOUNTER — Encounter: Payer: Self-pay | Admitting: Neurology

## 2019-10-10 VITALS — BP 152/76 | HR 98 | Resp 18 | Ht 64.0 in | Wt <= 1120 oz

## 2019-10-10 DIAGNOSIS — G40109 Localization-related (focal) (partial) symptomatic epilepsy and epileptic syndromes with simple partial seizures, not intractable, without status epilepticus: Secondary | ICD-10-CM

## 2019-10-10 MED ORDER — LAMOTRIGINE 25 MG PO TABS: ORAL_TABLET | ORAL | 3 refills | Status: DC

## 2019-10-10 MED ORDER — LEVETIRACETAM 1000 MG PO TABS: ORAL_TABLET | ORAL | 3 refills | Status: DC

## 2019-10-10 NOTE — Patient Instructions (Signed)
1. For the Lamotrigine 25mg : take 2 tablets in the morning, 2 tablets in the evening  2. Continue Levetiracetam (Keppra) 1000mg : take 1 and 1/2 tablets in the morning, 1 and 1/2 tablets in the evening  3. If leg numbness does not improve over time, this is likely from your back and we may do Physical therapy if you wish  4. Follow-up in 6 months, call for any changes   Seizure Precautions: 1. If medication has been prescribed for you to prevent seizures, take it exactly as directed.  Do not stop taking the medicine without talking to your doctor first, even if you have not had a seizure in a long time.   2. Avoid activities in which a seizure would cause danger to yourself or to others.  Don't operate dangerous machinery, swim alone, or climb in high or dangerous places, such as on ladders, roofs, or girders.  Do not drive unless your doctor says you may.  3. If you have any warning that you may have a seizure, lay down in a safe place where you can't hurt yourself.    4.  No driving for 6 months from last seizure, as per [facility].   Please refer to the following link on the [place] website for more information: http://www.epilepsyfoundation.org/answerplace/Social/driving/drivingu.cfm   5.  Maintain good sleep hygiene. Avoid alcohol.  6.  Contact your doctor if you have any problems that may be related to the medicine you are taking.  7.  Call 911 and bring the patient back to the ED if:        A.  The seizure lasts longer than 5 minutes.       B.  The patient doesn't awaken shortly after the seizure  C.  The patient has new problems such as difficulty seeing, speaking or moving  D.  The patient was injured during the seizure  E.  The patient has a temperature over 102 F (39C)  F.  The patient vomited and now is having trouble breathing

## 2019-10-10 NOTE — Progress Notes (Signed)
NEUROLOGY FOLLOW UP OFFICE NOTE  [name] [id number] 1940/06/23  HISTORY OF PRESENT ILLNESS: I had the pleasure of seeing [name] in follow-up in the neurology clinic on 10/10/2019. The patient was last evaluated by telephone 7 months ago for seizures.  She is accompanied by her daughter-in-law who helps supplement the history today. Since her last visit, they report a seizure last 10/04/19. Her left leg started getting numb then started jerking. She almost fell but was able to get to sit on the table in time. She had to take 3 Ativan tablets. She fell asleep after with no further jerking, however her left leg stays numb all the time now (not completely numb, but different) making her walk funny. She has chronic neck and back pain.  She does not walk much anymore and stays in bed most of the time, either using her walker or wheelchair. Right leg and arms are unaffected. She reports brief sensory seizure with left leg numbness occurring every 2 months. No loss of consciousness/awareness with these episodes. Her prescription for Lamotrigine is to take 25mg  2 tabs BID, however they both understood this to take 1 tab twice a day. She is also on Levetiracetam 1500mg  BID (1000mg  1.5 tabs BID). She reports poor sleep due to her husband with dementia up at night. No missed medications. No headaches, dizziness, no other falls.    History on Initial Assessment 07/28/2014: This is a 79 yo RH woman with a history of cardiomyopathy s/p ICD placement, hypertension, hyperlipidemia, diabetes, paroxysmal atrial fibrillation, who had a fall in December 2012, found to have multiple right extraaxial intracranial hemorrhages and INR >20 while on Coumadin at that time. She had a witnessed seizure in [place] then transferred to [facility] where she had a right craniotomy of evacuation of epidural hematoma. She had another seizure after the surgery. She was discharged home on Keppra. She had an admission in  May 2013 for recurrent seizures, there is note that she was being weaned off seizure medication, then had a seizure and Keppra was restarted. She was reported to have twitching. I personally reviewed head CT done at that time which showed right frontal craniotomy, no residual fluid collection. Remote cortical infarcts/contusions in the right and left occipital regions. She denies any further seizures until 05/24/14 when she started having uncontrollable twitching on the left side of her face and left arm per ER notes. She tells me today that her whole body was shaking. She was awake and alert, able to communicate, denies confusion. She denied focal weakness after. She was given IV Ativan with resolution of twitching. She had a head CT (images unavailable for review) which showed post-surgical right frontal craniotomy changes, chronic right PCA infarct, no acute abnormalities. She had an EEG reported as normal awake and drowsy EEG. She saw her PCP and had an elevated Keppra level on Keppra 750mg  BID, dose was reduced to 500mg  BID. She denies any further episodes of left-sided twitching. She has a prescription for prn Ativan.   Epilepsy Risk Factors: Right-sided intracranial hemorrhage in 2012 s/p craniotomy. Otherwise she had a normal birth and early development. There is no history of febrile convulsions, CNS infections such as meningitis/encephalitis, or family history of seizures.  PAST MEDICAL HISTORY: Past Medical History:  Diagnosis Date  . 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  s/p revision 2/2 ERI 02/28/2011 - [facility] CD 3257  . Chronic systolic heart failure ([place])       .  COPD (chronic obstructive pulmonary disease) ([place])    a. pt reports "scarring" at the lungs due to double PNA in the past.  . DM2 (diabetes mellitus, type 2) ([place])   . HLD (hyperlipidemia)   . Hypertension   . Hypokalemia   . ICD (implantable cardioverter-defibrillator) malfunction 03/22/2016  .  LBBB (left bundle branch block)   . NICM (nonischemic cardiomyopathy) ([place])    a. Initial EF of 35%. b. Normal in 07/2009. c. 35% in 2012, 30% in 12/2011. Reported h/o catheterization done presumably before her ICD implanted at [facility] demonstrated some years ago no obstructive coronary disease. Neg nuc 12/2011.  [name] PAF (paroxysmal atrial fibrillation) ([place])    a. Not on anticoag due to h/o SDH.  . Seizures ([place])   . Subdural hematoma ([place])    a. 12/12. Hospitalized at [facility], requiring craniotomy, complicated by seizures.  . Vitamin D deficiency     MEDICATIONS: Current Outpatient Medications on File Prior to Visit  Medication Sig Dispense Refill  . albuterol (PROVENTIL) (2.5 MG/3ML) 0.083% nebulizer solution Take 2.5 mg by nebulization every 6 (six) hours as needed for wheezing or shortness of breath.     [name] alendronate (FOSAMAX) 70 MG tablet Take 70 mg by mouth once a week.     [name] atorvastatin (LIPITOR) 40 MG tablet Take 40 mg by mouth daily at 6 PM.     . carvedilol (COREG) 25 MG tablet Take 25 mg by mouth 2 (two) times daily with a meal.      . furosemide (LASIX) 20 MG tablet Take 20 mg by mouth daily.     [name] lamoTRIgine (LAMICTAL) 25 MG tablet Take 2 tablets twice a day 360 tablet 3  . levETIRAcetam (KEPPRA) 1000 MG tablet Take 1.5 tablets twice a day 270 tablet 3  . LORazepam (ATIVAN) 1 MG tablet TAKE 1 OR 2 TABLETS BY MOUTH AS NEEDED FOR SEIZURES 10 tablet 5  . nitroGLYCERIN (NITROSTAT) 0.4 MG SL tablet Place 0.4 mg under the tongue every 5 (five) minutes as needed for chest pain (MAX 3 TABLETS).     [name] omeprazole (PRILOSEC) 40 MG capsule Take 40 mg by mouth 2 (two) times daily.     . OXYGEN Inhale 2 L into the lungs daily.    . potassium chloride (K-DUR) 10 MEQ tablet Take 20 mEq by mouth 2 (two) times daily. Take 2 tablets by mouth twice daily    . sacubitril-valsartan (ENTRESTO) 24-26 MG Take 1 tablet by mouth 2 (two) times daily.    . sitaGLIPtan (JANUVIA) 100 MG tablet Take 100 mg  by mouth daily.       No current facility-administered medications on file prior to visit.    ALLERGIES: Allergies  Allergen Reactions  . Codeine Other (See Comments)    "puts me on a trip"  . Gemfibrozil Diarrhea  . Lactose Intolerance (Gi) Diarrhea  . Morphine And Related Other (See Comments)    unknown  . Valium Hives and Rash    FAMILY HISTORY: Family History  Problem Relation Age of Onset  . Heart disease Mother   . Heart disease Father   . Heart disease Other     SOCIAL HISTORY: Social History   Socioeconomic History  . Marital status: Married    Spouse name: Not on file  . Number of children: 5  . Years of education: Not on file  . Highest education level: Not on file  Occupational History  . Occupation: retired  Tobacco Use  .  Smoking status: Former Smoker    Types: Cigarettes    Quit date: 01/17/1968    Years since quitting: 51.7  . Smokeless tobacco: Never Used  Vaping Use  . Vaping Use: Never used  Substance and Sexual Activity  . Alcohol use: No    Alcohol/week: 0.0 standard drinks  . Drug use: No  . Sexual activity: Not Currently    Partners: Male  Other Topics Concern  . Not on file  Social History Narrative   Registration notes - ICD = [employer]      Lives with husband and oldest son and daughter in law      One story home      Right handed      Highest Level of EDU- 10th grade   Social Determinants of Health   Financial Resource Strain:   . Difficulty of Paying Living Expenses: Not on file  Food Insecurity:   . Worried About Charity fundraiser in the Last Year: Not on file  . Ran Out of Food in the Last Year: Not on file  Transportation Needs:   . Lack of Transportation (Medical): Not on file  . Lack of Transportation (Non-Medical): Not on file  Physical Activity:   . Days of Exercise per Week: Not on file  . Minutes of Exercise per Session: Not on file  Stress:   . Feeling of Stress : Not on file  Social Connections:   .  Frequency of Communication with Friends and Family: Not on file  . Frequency of Social Gatherings with Friends and Family: Not on file  . Attends Religious Services: Not on file  . Active Member of Clubs or Organizations: Not on file  . Attends Archivist Meetings: Not on file  . Marital Status: Not on file  Intimate Partner Violence:   . Fear of Current or Ex-Partner: Not on file  . Emotionally Abused: Not on file  . Physically Abused: Not on file  . Sexually Abused: Not on file     PHYSICAL EXAM: Vitals:   10/10/19 1052  BP: (!) 152/76  Pulse: 98  Resp: 18  SpO2: 100%   General: No acute distress, sitting on wheelchair with O2 via [place]:  Normocephalic/atraumatic Skin/Extremities: No rash, no edema Neurological Exam: alert and oriented to person, place, and time. No aphasia or dysarthria. Fund of knowledge is appropriate.  Recent and remote memory are intact.  Attention and concentration are normal.   Cranial nerves: Pupils equal, round. Extraocular movements intact with no nystagmus. Visual fields full.  No facial asymmetry.  Motor: Bulk and tone normal, muscle strength 5/5 throughout with no pronator drift.   Finger to nose testing intact.  Gait not tested   IMPRESSION: This is an 79yo RH woman with a history of hypertension, hyperlipidemia, diabetes, cardiomyopathy s/p ICD placement, paroxysmal atrial fibrillation off anticoagulation due to intracranial bleed in 2012 with subsequent seizures. She had been overall doing well with no focal motor seizures for over a year until last 10/04/19 with left leg numbness then twitching. She had been taking Lamotrigine 25mg  BID and was instructed to take the Lamotrigine 25mg  2 tabs in AM, 2 tabs in PM as previously instructed, continue Levetiracetam 1500mg  BID. Discussed post-ictal seizure symptoms, however if left leg numbness does not improve in a few more days, this is likely due to her lumbar issues rather than seizure-related.  She is mostly in her wheelchair and rarely uses walker. She declines physical therapy.  She does not drive. Follow-up in 6 months, they know to call for any changes.   Thank you for allowing me to participate in her care.  Please do not hesitate to call for any questions or concerns.   Ellouise Newer, M.D.   CC: Dr. London Pepper

## 2019-10-15 ENCOUNTER — Encounter: Payer: Self-pay | Admitting: Neurology

## 2020-03-23 ENCOUNTER — Other Ambulatory Visit: Payer: Self-pay

## 2020-03-23 ENCOUNTER — Ambulatory Visit: Payer: Medicare HMO | Admitting: Internal Medicine

## 2020-03-23 ENCOUNTER — Encounter: Payer: Self-pay | Admitting: Internal Medicine

## 2020-03-23 VITALS — BP 130/72 | HR 83 | Ht 64.0 in | Wt 154.2 lb

## 2020-03-23 DIAGNOSIS — Z95810 Presence of automatic (implantable) cardiac defibrillator: Secondary | ICD-10-CM

## 2020-03-23 DIAGNOSIS — I5022 Chronic systolic (congestive) heart failure: Secondary | ICD-10-CM | POA: Diagnosis not present

## 2020-03-23 DIAGNOSIS — I428 Other cardiomyopathies: Secondary | ICD-10-CM | POA: Diagnosis not present

## 2020-03-23 NOTE — Patient Instructions (Signed)
Medication Instructions:  Your physician has recommended you make the following change in your medication:   ** Decrease your Januvia '100mg'$  to 1/2 tablet by mouth daily until you see your PCP next week.  *If you need a refill on your cardiac medications before your next appointment, please call your pharmacy*   Lab Work: None ordered.  If you have labs (blood work) drawn today and your tests are completely normal, you will receive your results only by: [name] MyChart Message (if you have MyChart) OR . A paper copy in the mail If you have any lab test that is abnormal or we need to change your treatment, we will call you to review the results.   Testing/Procedures: None ordered.   Follow-Up: At [facility], you and your health needs are our priority.  As part of our continuing mission to provide you with exceptional heart care, we have created designated Provider Care Teams.  These Care Teams include your primary Cardiologist (physician) and Advanced Practice Providers (APPs -  Physician Assistants and Nurse Practitioners) who all work together to provide you with the care you need, when you need it.  We recommend signing up for the patient portal called "MyChart".  Sign up information is provided on this After Visit Summary.  MyChart is used to connect with patients for Virtual Visits (Telemedicine).  Patients are able to view lab/test results, encounter notes, upcoming appointments, etc.  Non-urgent messages can be sent to your provider as well.   To learn more about what you can do with MyChart, go to [email or web address].    Your next appointment:   You will see Dr Caryl Comes as needed

## 2020-03-23 NOTE — Progress Notes (Signed)
Patient Care Team: Bonnita Nasuti, MD as PCP - General (Internal Medicine) Cameron Sprang, MD as Consulting Physician (Neurology)   HPI  [name] is a 80 y.o. female seen in followup for CRT-D implanted at [facility] for nonischemic cardiomyopathy.She underwent CRT device generator replacement   She was hospitalized March 2018 for inappropriate shocks secondary to lead failure. She had a Riata lead with known externalization. High-voltage therapies were inactivated.    Her device has  reached ERI and has been abandoned     Oxygen dependent COPD.  Exertional dyspnea and chest discomfort.  No known coronary disease.  Gets worn out by sitting and/or standing.  Both required her to lie down after a while.  Recently blood sugars have been low.      DATE TEST EF   12/12 Echo   35 %   8/17 Echo   45-50 %   4/18 Echo   40--45%    Date Cr K Hgb  7/18  0.94 4.5 12.1  12/19  1.8<<2.15 4.5                Past Medical History:  Diagnosis Date  . 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  s/p revision 2/2 ERI 02/28/2011 - [facility] CD 3257  . Chronic systolic heart failure ([place])       . COPD (chronic obstructive pulmonary disease) ([place])    a. pt reports "scarring" at the lungs due to double PNA in the past.  . DM2 (diabetes mellitus, type 2) ([place])   . HLD (hyperlipidemia)   . Hypertension   . Hypokalemia   . ICD (implantable cardioverter-defibrillator) malfunction 03/22/2016  . LBBB (left bundle branch block)   . NICM (nonischemic cardiomyopathy) ([place])    a. Initial EF of 35%. b. Normal in 07/2009. c. 35% in 2012, 30% in 12/2011. Reported h/o catheterization done presumably before her ICD implanted at [facility] demonstrated some years ago no obstructive coronary disease. Neg nuc 12/2011.  [name] PAF (paroxysmal atrial fibrillation) ([place])    a. Not on anticoag due to h/o SDH.  . Seizures ([place])   . Subdural hematoma ([place])    a. 12/12. Hospitalized at  [facility], requiring craniotomy, complicated by seizures.  . Vitamin D deficiency     Past Surgical History:  Procedure Laterality Date  . BRAIN HEMATOMA EVACUATION    . CARDIAC CATHETERIZATION     no significant CAD  . CARDIAC DEFIBRILLATOR PLACEMENT  2006  . CHOLECYSTECTOMY    . IMPLANTABLE CARDIOVERTER DEFIBRILLATOR (ICD) GENERATOR CHANGE N/A 03/01/2011   Procedure: ICD GENERATOR CHANGE;  Surgeon: Deboraha Sprang, MD;  Location: [facility] CATH LAB;  Service: Cardiovascular;  Laterality: N/A;  . TOTAL ABDOMINAL HYSTERECTOMY  1983    Current Outpatient Medications  Medication Sig Dispense Refill  . albuterol (PROVENTIL) (2.5 MG/3ML) 0.083% nebulizer solution Take 2.5 mg by nebulization every 6 (six) hours as needed for wheezing or shortness of breath.     [name] alendronate (FOSAMAX) 70 MG tablet Take 70 mg by mouth once a week.     [name] atorvastatin (LIPITOR) 40 MG tablet Take 40 mg by mouth daily at 6 PM.     . cyanocobalamin (,VITAMIN B-12,) 1000 MCG/ML injection every 30 (thirty) days.    . ergocalciferol (VITAMIN D2) 1.25 MG (50000 UT) capsule Take 1 capsule by mouth once a week.    . furosemide (LASIX) 20 MG tablet Take 20 mg by mouth  daily.     . lamoTRIgine (LAMICTAL) 25 MG tablet Take 2 tablets in AM, 2 tablets in PM 360 tablet 3  . levETIRAcetam (KEPPRA) 1000 MG tablet Take 1.5 tablets in AM, 1.5 tablets in PM 270 tablet 3  . LORazepam (ATIVAN) 1 MG tablet TAKE 1 OR 2 TABLETS BY MOUTH AS NEEDED FOR SEIZURES 10 tablet 5  . nitroGLYCERIN (NITROSTAT) 0.4 MG SL tablet Place 0.4 mg under the tongue every 5 (five) minutes as needed for chest pain (MAX 3 TABLETS).     [name] omeprazole (PRILOSEC) 40 MG capsule Take 1 capsule by mouth 2 (two) times daily.    . OXYGEN Inhale 2 L into the lungs daily.    . potassium chloride (K-DUR) 10 MEQ tablet Take 20 mEq by mouth 2 (two) times daily. Take 2 tablets by mouth twice daily    . sacubitril-valsartan (ENTRESTO) 24-26 MG Take 1 tablet by mouth 2 (two) times  daily.    . sitaGLIPtan (JANUVIA) 100 MG tablet Take 100 mg by mouth daily.    . Tiotropium Bromide-Olodaterol (STIOLTO RESPIMAT) 2.5-2.5 MCG/ACT AERS daily.     No current facility-administered medications for this visit.    Allergies  Allergen Reactions  . Codeine Other (See Comments)    "puts me on a trip"  . Gemfibrozil Diarrhea  . Lactose Intolerance (Gi) Diarrhea  . Lactose Other (See Comments)  . Morphine And Related Other (See Comments)    unknown  . Valium Hives and Rash    Review of Systems negative except from HPI and PMH  Physical Exam BP 130/72   Pulse 83   Ht '5\' 4"'$  (1.626 m)   Wt 154 lb 3.2 oz (69.9 kg)   SpO2 99% Comment: with oxygen  BMI 26.47 kg/m  Well developed and nourished in no acute distress HENT normal Neck supple   Clear Regular rate and rhythm, no murmurs or gallops Abd-soft with active BS No Clubbing cyanosis no edema Skin-warm and dry A & Oriented  Grossly normal sensory and motor function  ECG *sinus at 83 Interval 16/14/42 Nonspecific ST-T changes Unchanged from 2019  Assessment and  Plan  Congestive heart failure- /chronic-diastolic  Nonischemic cardiomyopathy interval near normalization  Oxygen dependent COPD  Renal insufficiency  Gr 3-4  CRT D.-St. Jude's at EOS not replaced  Relative hypoglycemia     The patient is euvolemic. No cardiac changes. Suspect chest pain is related to COPD as opposed to coronary disease that she has been known to have nonischemic heart disease with a negative Myoview in 2016  With her relatively low blood sugars in the morning, I have asked her to decrease her Januvia 100--50 until she sees her PCP next week.  Given how difficult it is for her to travel up here, we will see her as needed.

## 2020-03-31 LAB — CUP PACEART INCLINIC DEVICE CHECK
Date Time Interrogation Session: 20220308141500
Implantable Lead Implant Date: 20060925
Implantable Lead Implant Date: 20060925
Implantable Lead Implant Date: 20060925
Implantable Lead Location: 753858
Implantable Lead Location: 753859
Implantable Lead Location: 753860
Implantable Lead Model: 1580
Implantable Pulse Generator Implant Date: 20130213
Pulse Gen Serial Number: 7025184

## 2020-05-25 ENCOUNTER — Ambulatory Visit: Payer: Medicare HMO | Admitting: Neurology

## 2020-05-26 ENCOUNTER — Ambulatory Visit: Payer: Medicare HMO | Admitting: Neurology

## 2020-06-01 ENCOUNTER — Encounter: Payer: Self-pay | Admitting: Neurology

## 2020-06-01 ENCOUNTER — Other Ambulatory Visit: Payer: Self-pay

## 2020-06-01 ENCOUNTER — Ambulatory Visit: Payer: Medicare HMO | Admitting: Neurology

## 2020-06-01 VITALS — HR 71 | Resp 20 | Ht 64.0 in | Wt 145.0 lb

## 2020-06-01 DIAGNOSIS — G40109 Localization-related (focal) (partial) symptomatic epilepsy and epileptic syndromes with simple partial seizures, not intractable, without status epilepticus: Secondary | ICD-10-CM | POA: Diagnosis not present

## 2020-06-01 DIAGNOSIS — G4486 Cervicogenic headache: Secondary | ICD-10-CM

## 2020-06-01 MED ORDER — LORAZEPAM 1 MG PO TABS: ORAL_TABLET | ORAL | 5 refills | Status: DC

## 2020-06-01 MED ORDER — LEVETIRACETAM 1000 MG PO TABS: ORAL_TABLET | ORAL | 3 refills | Status: DC

## 2020-06-01 MED ORDER — LAMOTRIGINE 25 MG PO TABS: ORAL_TABLET | ORAL | 3 refills | Status: DC

## 2020-06-01 NOTE — Progress Notes (Signed)
NEUROLOGY FOLLOW UP OFFICE NOTE  [name] HN:[id number] 1940-03-17  HISTORY OF PRESENT ILLNESS: I had the pleasure of seeing [name] in follow-up in the neurology clinic on 06/01/2020.  The patient was last seen 8 months ago for seizures. She is again accompanied by her daughter-in-law [name] who helps supplement the history today. On her last visit, she was instructed to take Lamotrigine '25mg'$  2 tablets twice a day as written on the prescription. She is also on Levetiracetam '1000mg'$  1.5 tabs BID ('1500mg'$  BID). Since then, they report 2 seizures. She had one in the Fall while walking the dog, again affecting her left leg. She was able to get on the porch table and called out to [place], when [place] arrived there was no loss of awareness/consciousness noted. She had another on the bed that was not bad, she took her prn Ativan. She has occasional headaches, last week the back of her head hurt real bad, Tylenol and sleep helped. She woke up with a headache this morning that has resolved. She has neck, shoulder, and back pain. She feels dizzy when standing up quickly. No falls. She uses a cane at home, then uses her wheelchair on trips outside. Her legs get weak quickly, no bowel/bladder dysfunction.    History on Initial Assessment 07/28/2014: This is a 80 yo RH woman with a history of cardiomyopathy s/p ICD placement, hypertension, hyperlipidemia, diabetes, paroxysmal atrial fibrillation, who had a fall in December 2012, found to have multiple right extraaxial intracranial hemorrhages and INR >20 while on Coumadin at that time. She had a witnessed seizure in [place] then transferred to [facility] where she had a right craniotomy of evacuation of epidural hematoma. She had another seizure after the surgery. She was discharged home on Keppra. She had an admission in May 2013 for recurrent seizures, there is note that she was being weaned off seizure medication, then had a seizure and Keppra was  restarted. She was reported to have twitching. I personally reviewed head CT done at that time which showed right frontal craniotomy, no residual fluid collection. Remote cortical infarcts/contusions in the right and left occipital regions. She denies any further seizures until 05/24/14 when she started having uncontrollable twitching on the left side of her face and left arm per ER notes. She tells me today that her whole body was shaking. She was awake and alert, able to communicate, denies confusion. She denied focal weakness after. She was given IV Ativan with resolution of twitching. She had a head CT (images unavailable for review) which showed post-surgical right frontal craniotomy changes, chronic right PCA infarct, no acute abnormalities. She had an EEG reported as normal awake and drowsy EEG. She saw her PCP and had an elevated Keppra level on Keppra '750mg'$  BID, dose was reduced to '500mg'$  BID. She denies any further episodes of left-sided twitching. She has a prescription for prn Ativan.   Epilepsy Risk Factors: Right-sided intracranial hemorrhage in 2012 s/p craniotomy. Otherwise she had a normal birth and early development. There is no history of febrile convulsions, CNS infections such as meningitis/encephalitis, or family history of seizures. PAST MEDICAL HISTORY: Past Medical History:  Diagnosis Date  . 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  s/p revision 2/2 ERI 02/28/2011 - [facility] CD 3257  . Chronic systolic heart failure ([place])       . COPD (chronic obstructive pulmonary disease) ([place])    a. pt reports "scarring" at the lungs due to  double PNA in the past.  . DM2 (diabetes mellitus, type 2) ([place])   . HLD (hyperlipidemia)   . Hypertension   . Hypokalemia   . ICD (implantable cardioverter-defibrillator) malfunction 03/22/2016  . LBBB (left bundle branch block)   . NICM (nonischemic cardiomyopathy) ([place])    a. Initial EF of 35%. b. Normal in 07/2009. c. 35%  in 2012, 30% in 12/2011. Reported h/o catheterization done presumably before her ICD implanted at [facility] demonstrated some years ago no obstructive coronary disease. Neg nuc 12/2011.  [name] PAF (paroxysmal atrial fibrillation) ([place])    a. Not on anticoag due to h/o SDH.  . Seizures ([place])   . Subdural hematoma ([place])    a. 12/12. Hospitalized at [facility], requiring craniotomy, complicated by seizures.  . Vitamin D deficiency     MEDICATIONS: Current Outpatient Medications on File Prior to Visit  Medication Sig Dispense Refill  . albuterol (PROVENTIL) (2.5 MG/3ML) 0.083% nebulizer solution Take 2.5 mg by nebulization every 6 (six) hours as needed for wheezing or shortness of breath.     [name] alendronate (FOSAMAX) 70 MG tablet Take 70 mg by mouth once a week.     [name] atorvastatin (LIPITOR) 40 MG tablet Take 40 mg by mouth daily at 6 PM.     . cyanocobalamin (,VITAMIN B-12,) 1000 MCG/ML injection every 30 (thirty) days.    . ergocalciferol (VITAMIN D2) 1.25 MG (50000 UT) capsule Take 1 capsule by mouth once a week.    . furosemide (LASIX) 20 MG tablet Take 20 mg by mouth daily.     [name] lamoTRIgine (LAMICTAL) 25 MG tablet Take 2 tablets in AM, 2 tablets in PM 360 tablet 3  . levETIRAcetam (KEPPRA) 1000 MG tablet Take 1.5 tablets in AM, 1.5 tablets in PM 270 tablet 3  . LORazepam (ATIVAN) 1 MG tablet TAKE 1 OR 2 TABLETS BY MOUTH AS NEEDED FOR SEIZURES 10 tablet 5  . nitroGLYCERIN (NITROSTAT) 0.4 MG SL tablet Place 0.4 mg under the tongue every 5 (five) minutes as needed for chest pain (MAX 3 TABLETS).     [name] omeprazole (PRILOSEC) 40 MG capsule Take 1 capsule by mouth 2 (two) times daily.    . OXYGEN Inhale 2 L into the lungs daily.    . potassium chloride (K-DUR) 10 MEQ tablet Take 20 mEq by mouth 2 (two) times daily. Take 2 tablets by mouth twice daily    . sacubitril-valsartan (ENTRESTO) 24-26 MG Take 1 tablet by mouth 2 (two) times daily.    . sitaGLIPtan (JANUVIA) 100 MG tablet Take 100 mg by mouth daily.   Take 1/2 tablet by mouth daily until you see your PCP next week.    . Tiotropium Bromide-Olodaterol (STIOLTO RESPIMAT) 2.5-2.5 MCG/ACT AERS daily.     No current facility-administered medications on file prior to visit.    ALLERGIES: Allergies  Allergen Reactions  . Codeine Other (See Comments)    "puts me on a trip"  . Gemfibrozil Diarrhea  . Lactose Intolerance (Gi) Diarrhea  . Lactose Other (See Comments)  . Morphine And Related Other (See Comments)    unknown  . Valium Hives and Rash    FAMILY HISTORY: Family History  Problem Relation Age of Onset  . Heart disease Mother   . Heart disease Father   . Heart disease Other     SOCIAL HISTORY: Social History   Socioeconomic History  . Marital status: Married    Spouse name: Not on file  . Number of children:  5  . Years of education: Not on file  . Highest education level: Not on file  Occupational History  . Occupation: retired  Tobacco Use  . Smoking status: Former Smoker    Types: Cigarettes    Quit date: 01/17/1968    Years since quitting: 52.4  . Smokeless tobacco: Never Used  Vaping Use  . Vaping Use: Never used  Substance and Sexual Activity  . Alcohol use: No    Alcohol/week: 0.0 standard drinks  . Drug use: No  . Sexual activity: Not Currently    Partners: Male  Other Topics Concern  . Not on file  Social History Narrative   Registration notes - ICD = St. Jude      Lives with husband and oldest son and daughter in law      One story home      Right handed      Highest Level of EDU- 10th grade   Social Determinants of Health   Financial Resource Strain: Not on file  Food Insecurity: Not on file  Transportation Needs: Not on file  Physical Activity: Not on file  Stress: Not on file  Social Connections: Not on file  Intimate Partner Violence: Not on file     PHYSICAL EXAM: Vitals:   06/01/20 0921  [name]: 71  Resp: 20  SpO2: 99%   General: No acute distress, sitting on  wheelchair Head:  Normocephalic/atraumatic Skin/Extremities: No rash, no edema Neurological Exam: alert and awake. No aphasia or dysarthria. Fund of knowledge is appropriate. Attention and concentration are normal.   Cranial nerves: Pupils equal, round. Extraocular movements intact with no nystagmus. Visual fields full.  No facial asymmetry.  Motor: Bulk and tone normal, muscle strength 5/5 throughout reporting pain on shoulder abduction, no pronator drift.   Finger to nose testing intact.  Gait not tested, sitting on wheelchair.   IMPRESSION: This is an 80 yo RH woman with a history of hypertension, hyperlipidemia, diabetes, cardiomyopathy s/p ICD placement, paroxysmal atrial fibrillation off anticoagulation due to intracranial bleed in 2012 with subsequent seizures. She reports 2 brief focal motor seizures in the past 8 months affecting her left leg, no injuries. We agreed to continue on current doses of Levetiracetam '1500mg'$  BID and Lamotrigine '50mg'$  BID ('25mg'$  2 tabs BID) for now, they know to call for any changes, low threshold to increase Lamotrigine if needed. She has prn Ativan for rescue. She does not drive. She reports occasional headaches suggestive of cervicogenic headaches, consider physical therapy for neck pain if headaches worsen. Follow-up in 6-8 months, call for any changes.   Thank you for allowing me to participate in her care.  Please do not hesitate to call for any questions or concerns.   Ellouise Newer, M.D.   CC: Dr. London Pepper

## 2020-06-01 NOTE — Patient Instructions (Signed)
Good to see you!  1. Continue Lamotrigine '25mg'$ : take 2 tablets in AM, 2 tablets in PM  2. Continue Levetiracetam '1000mg'$ : take 1 and 1/2 tablets in AM, 1 and 1/2 tablets in PM  3. Continue to monitor seizures, if they increase in frequency, call us and we can increase the Lamotrigine  4. If headaches increase, would recommend physical therapy to help with neck pain, which can cause headaches in the back of your head  5. Follow-up in 6-8 months, call for any changes   Seizure Precautions: 1. If medication has been prescribed for you to prevent seizures, take it exactly as directed.  Do not stop taking the medicine without talking to your doctor first, even if you have not had a seizure in a long time.   2. Avoid activities in which a seizure would cause danger to yourself or to others.  Don't operate dangerous machinery, swim alone, or climb in high or dangerous places, such as on ladders, roofs, or girders.  Do not drive unless your doctor says you may.  3. If you have any warning that you may have a seizure, lay down in a safe place where you can't hurt yourself.    4.  No driving for 6 months from last seizure, as per [facility].   Please refer to the following link on the [place] website for more information: http://[email or web address]/[email or web address]/drivingu.cfm   5.  Maintain good sleep hygiene.  6.  Contact your doctor if you have any problems that may be related to the medicine you are taking.  7.  Call 911 and bring the patient back to the ED if:        A.  The seizure lasts longer than 5 minutes.       B.  The patient doesn't awaken shortly after the seizure  C.  The patient has new problems such as difficulty seeing, speaking or moving  D.  The patient was injured during the seizure  E.  The patient has a temperature over 102 F (39C)  F.  The patient vomited and now is having trouble breathing

## 2020-08-09 ENCOUNTER — Other Ambulatory Visit: Payer: Self-pay | Admitting: Nephrology

## 2020-08-09 DIAGNOSIS — I129 Hypertensive chronic kidney disease with stage 1 through stage 4 chronic kidney disease, or unspecified chronic kidney disease: Secondary | ICD-10-CM

## 2020-08-09 DIAGNOSIS — N184 Chronic kidney disease, stage 4 (severe): Secondary | ICD-10-CM

## 2020-08-19 ENCOUNTER — Ambulatory Visit
Admission: RE | Admit: 2020-08-19 | Discharge: 2020-08-19 | Disposition: A | Discharge status: Home / Self Care | Payer: Medicare HMO | Source: Ambulatory Visit | Attending: Nephrology | Admitting: Nephrology

## 2020-08-19 ENCOUNTER — Other Ambulatory Visit: Payer: Self-pay

## 2020-08-19 DIAGNOSIS — N184 Chronic kidney disease, stage 4 (severe): Secondary | ICD-10-CM

## 2020-08-19 DIAGNOSIS — I129 Hypertensive chronic kidney disease with stage 1 through stage 4 chronic kidney disease, or unspecified chronic kidney disease: Secondary | ICD-10-CM

## 2020-11-29 ENCOUNTER — Emergency Department (HOSPITAL_COMMUNITY)
Admission: EM | Admit: 2020-11-29 | Discharge: 2020-11-29 | Disposition: A | Discharge status: Home / Self Care | Payer: Medicare HMO | Attending: Emergency Medicine | Admitting: Emergency Medicine

## 2020-11-29 ENCOUNTER — Other Ambulatory Visit: Payer: Self-pay

## 2020-11-29 ENCOUNTER — Emergency Department (HOSPITAL_COMMUNITY): Payer: Medicare HMO

## 2020-11-29 DIAGNOSIS — Z20822 Contact with and (suspected) exposure to covid-19: Secondary | ICD-10-CM | POA: Diagnosis not present

## 2020-11-29 DIAGNOSIS — R1110 Vomiting, unspecified: Secondary | ICD-10-CM | POA: Insufficient documentation

## 2020-11-29 DIAGNOSIS — R002 Palpitations: Secondary | ICD-10-CM | POA: Diagnosis not present

## 2020-11-29 DIAGNOSIS — R0602 Shortness of breath: Secondary | ICD-10-CM | POA: Diagnosis not present

## 2020-11-29 DIAGNOSIS — I110 Hypertensive heart disease with heart failure: Secondary | ICD-10-CM | POA: Insufficient documentation

## 2020-11-29 DIAGNOSIS — Z87891 Personal history of nicotine dependence: Secondary | ICD-10-CM | POA: Insufficient documentation

## 2020-11-29 DIAGNOSIS — E119 Type 2 diabetes mellitus without complications: Secondary | ICD-10-CM | POA: Insufficient documentation

## 2020-11-29 DIAGNOSIS — Z79899 Other long term (current) drug therapy: Secondary | ICD-10-CM | POA: Diagnosis not present

## 2020-11-29 DIAGNOSIS — I2510 Atherosclerotic heart disease of native coronary artery without angina pectoris: Secondary | ICD-10-CM | POA: Insufficient documentation

## 2020-11-29 DIAGNOSIS — I5022 Chronic systolic (congestive) heart failure: Secondary | ICD-10-CM | POA: Insufficient documentation

## 2020-11-29 DIAGNOSIS — J449 Chronic obstructive pulmonary disease, unspecified: Secondary | ICD-10-CM | POA: Diagnosis not present

## 2020-11-29 DIAGNOSIS — R0789 Other chest pain: Secondary | ICD-10-CM | POA: Insufficient documentation

## 2020-11-29 DIAGNOSIS — R42 Dizziness and giddiness: Secondary | ICD-10-CM | POA: Insufficient documentation

## 2020-11-29 DIAGNOSIS — R079 Chest pain, unspecified: Secondary | ICD-10-CM

## 2020-11-29 LAB — CBC
HCT: 39.2 % (ref 36.0–46.0)
Hemoglobin: 12.9 g/dL (ref 12.0–15.0)
MCH: 28.2 pg (ref 26.0–34.0)
MCHC: 32.9 g/dL (ref 30.0–36.0)
MCV: 85.8 fL (ref 80.0–100.0)
Platelets: 155 10*3/uL (ref 150–400)
RBC: 4.57 MIL/uL (ref 3.87–5.11)
RDW: 13 % (ref 11.5–15.5)
WBC: 5.6 10*3/uL (ref 4.0–10.5)
nRBC: 0 % (ref 0.0–0.2)

## 2020-11-29 LAB — BASIC METABOLIC PANEL
Anion gap: 9 (ref 5–15)
BUN: 17 mg/dL (ref 8–23)
CO2: 23 mmol/L (ref 22–32)
Calcium: 8.7 mg/dL — ABNORMAL LOW (ref 8.9–10.3)
Chloride: 107 mmol/L (ref 98–111)
Creatinine, Ser: 1.56 mg/dL — ABNORMAL HIGH (ref 0.44–1.00)
GFR, Estimated: 33 mL/min — ABNORMAL LOW (ref >60)
Glucose, Bld: 114 mg/dL — ABNORMAL HIGH (ref 70–99)
Potassium: 4.2 mmol/L (ref 3.5–5.1)
Sodium: 139 mmol/L (ref 135–145)

## 2020-11-29 LAB — BRAIN NATRIURETIC PEPTIDE: B Natriuretic Peptide: 98.8 pg/mL (ref 0.0–100.0)

## 2020-11-29 LAB — RESP PANEL BY RT-PCR (FLU A&B, COVID) ARPGX2
Influenza A by PCR: NEGATIVE
Influenza B by PCR: NEGATIVE
SARS Coronavirus 2 by RT PCR: NEGATIVE

## 2020-11-29 LAB — TROPONIN I (HIGH SENSITIVITY)
Troponin I (High Sensitivity): 12 ng/L (ref <18)
Troponin I (High Sensitivity): 11 ng/L (ref <18)

## 2020-11-29 NOTE — ED Provider Notes (Signed)
Emergency Medicine Provider Triage Evaluation Note  Marra Fraga , a 80 y.o. female  was evaluated in triage.  Pt complains of chest pain and increased shortness of breath since Friday. History of CHF, chronically on 2L nasal cannula, no increased in oxygen requirement. Took nitro at home with relief.   Review of Systems  Positive: Chest pain, SOB, tachycardia Negative: Abdominal pain, nausea, vomiting, leg pain or swelling  Physical Exam  There were no vitals taken for this visit. Gen:   Awake, no distress   Resp:  Normal effort  MSK:   Moves extremities without difficulty  Other:  Lungs clear to auscultation   Medical Decision Making  Medically screening exam initiated at 3:44 PM.  Appropriate orders placed.  Tkeyah Stall was informed that the remainder of the evaluation will be completed by another provider, this initial triage assessment does not replace that evaluation, and the importance of remaining in the ED until their evaluation is complete.     Estill Cotta 11/29/20 1545    Lucrezia Starch, MD 11/30/20 [phone]

## 2020-11-29 NOTE — ED Provider Notes (Signed)
[facility] EMERGENCY DEPARTMENT Provider Note   CSN: [id number] Arrival date & time: 11/29/20  1431     History Chief Complaint  Patient presents with   Chest Pain    [name] is a 80 y.o. female.  She has a history of CHF COPD is on home oxygen.  Has a history of an AICD and a pacer which apparently has been turned off by her cardiologist.  Complaining of palpitations and fast heart rate since 4 days ago intermittent responsive to nitroglycerin.  She is also felt short of breath and sometimes is dizzy.  She has had 1 episode of vomiting and one episode of diarrhea.  No sick contacts or recent travel.  Follows with Dr. Caryl Comes cardiology.  The history is provided by the patient.  Palpitations Palpitations quality:  Fast Onset quality:  Gradual Duration:  4 days Timing:  Intermittent Progression:  Unchanged Chronicity:  Recurrent Relieved by: Nitroglycerin. 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  s/p revision 2/2 ERI 02/28/2011 - [facility] CD 2683   Chronic systolic heart failure (HCC)        COPD (chronic obstructive pulmonary disease) ([place])    a. pt reports "scarring" at the lungs due to double PNA in the past.   DM2 (diabetes mellitus, type 2) (HCC)    HLD (hyperlipidemia)    Hypertension    Hypokalemia    ICD (implantable cardioverter-defibrillator) malfunction 03/22/2016   LBBB (left bundle branch block)    NICM (nonischemic cardiomyopathy) ([place])    a. Initial EF of 35%. b. Normal in 07/2009. c. 35% in 2012, 30% in 12/2011. Reported h/o catheterization done presumably before her ICD implanted at [facility]  demonstrated some years ago no obstructive coronary disease. Neg nuc 12/2011.   PAF (paroxysmal atrial fibrillation) ([place])    a. Not on anticoag due to h/o SDH.   Seizures ([place])    Subdural hematoma ([place])    a. 12/12. Hospitalized at [facility], requiring craniotomy, complicated by seizures.   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, not intractable, without status epilepticus ([place]) 07/28/2014   History of subdural hematoma 07/28/2014   LBBB (left bundle branch block)    Seizures (HCC)    Biventricular implantable cardioverter-defibrillator in situ    Hypertension    Fatigue 41/96/2229   Chronic systolic heart failure ([place])    ICD-St.Jude-biventricular    NICM (nonischemic cardiomyopathy) ([place])    Hypokalemia 01/03/2011   Anemia 01/03/2011   Diarrhea 01/03/2011   Gastritis 01/03/2011   Hypomagnesemia 01/03/2011   Atrial fibrillation ([place]) 12/23/2010   Essential hypertension 12/23/2010   Chronic obstructive pulmonary disease ([place]) 12/23/2010   Subdural hematoma ([place]) 12/23/2010   Chronic coronary artery disease 12/23/2010   Hypercholesterolemia 12/23/2010    Past Surgical History:  Procedure Laterality Date   BRAIN HEMATOMA EVACUATION     CARDIAC CATHETERIZATION     no significant CAD   CARDIAC DEFIBRILLATOR PLACEMENT  2006  CHOLECYSTECTOMY     IMPLANTABLE CARDIOVERTER DEFIBRILLATOR (ICD) GENERATOR CHANGE N/A 03/01/2011   Procedure: ICD GENERATOR CHANGE;  Surgeon: Deboraha Sprang, MD;  Location: [facility] CATH LAB;  Service: Cardiovascular;  Laterality: N/A;   TOTAL ABDOMINAL HYSTERECTOMY  1983     OB History   No obstetric history on file.     Family History  Problem Relation Age of Onset   Heart disease Mother    Heart disease Father    Heart disease  Other     Social History   Tobacco Use   Smoking status: Former    Types: Cigarettes    Quit date: 01/17/1968    Years since quitting: 52.9   Smokeless tobacco: Never  Vaping Use   Vaping Use: Never used  Substance Use Topics   Alcohol use: No    Alcohol/week: 0.0 standard drinks   Drug use: No    Home Medications Prior to Admission medications   Medication Sig Start Date End Date Taking? Authorizing Provider  albuterol (PROVENTIL) (2.5 MG/3ML) 0.083% nebulizer solution Take 2.5 mg by nebulization every 6 (six) hours as needed for wheezing or shortness of breath.  07/15/15   [provider]  alendronate (FOSAMAX) 70 MG tablet Take 70 mg by mouth once a week.  11/13/16   [provider]  atorvastatin (LIPITOR) 40 MG tablet Take 40 mg by mouth daily at 6 PM.  05/18/14   [provider]  cyanocobalamin (,VITAMIN B-12,) 1000 MCG/ML injection every 30 (thirty) days.    [provider]  ergocalciferol (VITAMIN D2) 1.25 MG (50000 UT) capsule Take 1 capsule by mouth once a week. 01/05/11   [provider]  furosemide (LASIX) 20 MG tablet Take 20 mg by mouth daily.     Hague, Rosalyn Charters, MD  lamoTRIgine (LAMICTAL) 25 MG tablet Take 2 tablets in AM, 2 tablets in PM 06/01/20   Cameron Sprang, MD  levETIRAcetam (KEPPRA) 1000 MG tablet Take 1.5 tablets in AM, 1.5 tablets in PM 06/01/20   Cameron Sprang, MD  LORazepam (ATIVAN) 1 MG tablet Take 1 tablet as needed for seizure. Do not take more than 3 a week. 06/01/20   Cameron Sprang, MD  metoprolol succinate (TOPROL XL) 50 MG 24 hr tablet Take 50 mg by mouth daily. Take with or immediately following a meal.    [provider]  nitroGLYCERIN (NITROSTAT) 0.4 MG SL tablet Place 0.4 mg under the tongue every 5 (five) minutes as needed for chest pain (MAX 3 TABLETS).  Patient not taking: Reported on 06/01/2020    [provider]  omeprazole (PRILOSEC) 40 MG capsule Take 1 capsule by mouth 2 (two)  times daily.    [provider]  OXYGEN Inhale 2 L into the lungs daily.    [provider]  potassium chloride (K-DUR) 10 MEQ tablet Take 20 mEq by mouth 2 (two) times daily. Take 2 tablets by mouth twice daily    [provider]  rivastigmine (EXELON) 4.6 mg/24hr 1 patch to skin 03/29/20   [provider]  sacubitril-valsartan (ENTRESTO) 24-26 MG Take 1 tablet by mouth 2 (two) times daily.    [provider]  sitaGLIPtan (JANUVIA) 100 MG tablet Take 100 mg by mouth daily.  Take 1/2 tablet by mouth daily until you see your PCP next week.    [provider]  spironolactone (ALDACTONE) 25 MG tablet 1 tablet 04/01/20   [provider]  Tiotropium Bromide-Olodaterol (STIOLTO RESPIMAT)  2.5-2.5 MCG/ACT AERS daily.    [provider]    Allergies    Codeine, Gemfibrozil, Lactose intolerance (gi), Lactose, Morphine and related, and Valium  Review of Systems   Review of Systems  Constitutional:  Negative for diaphoresis.  HENT:  Negative for sore throat.   Eyes:  Negative for visual disturbance.  Respiratory:  Positive for shortness of breath. Negative for cough.   Cardiovascular:  Positive for chest pain and palpitations. Negative for syncope.  Gastrointestinal:  Positive for diarrhea and vomiting. Negative for abdominal pain.  Genitourinary:  Negative for dysuria.  Musculoskeletal:  Negative for neck pain.  Skin:  Negative for rash.  Neurological:  Positive for dizziness.   Physical Exam Updated Vital Signs BP 129/69   Pulse 75   Temp 98.3 F (36.8 C) (Oral)   Resp 17   SpO2 100%   Physical Exam Vitals and nursing note reviewed.  Constitutional:      General: She is not in acute distress.    Appearance: She is well-developed.  HENT:     Head: Normocephalic and atraumatic.  Eyes:     Conjunctiva/sclera: Conjunctivae normal.  Cardiovascular:     Rate and Rhythm: Normal rate and regular rhythm.     Heart  sounds: No murmur heard. Pulmonary:     Effort: Pulmonary effort is normal. No respiratory distress.     Breath sounds: Normal breath sounds.  Abdominal:     Palpations: Abdomen is soft.     Tenderness: There is no abdominal tenderness. There is no guarding or rebound.  Musculoskeletal:        General: Normal range of motion.     Cervical back: Neck supple.     Right lower leg: No tenderness. No edema.     Left lower leg: No tenderness. No edema.  Skin:    General: Skin is warm and dry.  Neurological:     General: No focal deficit present.     Mental Status: She is alert.    ED Results / Procedures / Treatments   Labs (all labs ordered are listed, but only abnormal results are displayed) Labs Reviewed  BASIC METABOLIC PANEL - Abnormal; Notable for the following components:      Result Value   Glucose, Bld 114 (*)    Creatinine, Ser 1.56 (*)    Calcium 8.7 (*)    GFR, Estimated 33 (*)    All other components within normal limits  RESP PANEL BY RT-PCR (FLU A&B, COVID) ARPGX2  CBC  BRAIN NATRIURETIC PEPTIDE  TROPONIN I (HIGH SENSITIVITY)  TROPONIN I (HIGH SENSITIVITY)    EKG EKG Interpretation  Date/Time:  Monday November 29 2020 15:53:09 EST Ventricular Rate:  72 PR Interval:  146 QRS Duration: 136 QT Interval:  426 QTC Calculation: 466 R Axis:   137 Text Interpretation: Normal sinus rhythm Right axis deviation Left ventricular hypertrophy with QRS widening and repolarization abnormality ( Cornell product ) Abnormal ECG new ST. T wave changes from prior 12/13 Confirmed by Aletta Edouard [phone]) on 11/29/2020 5:20:52 PM  Radiology DG Chest 2 View  Result Date: 11/29/2020 CLINICAL DATA:  Chest pain, tachycardia, shortness of breath EXAM: CHEST - 2 VIEW COMPARISON:  CT chest from 01/02/2018 FINDINGS: Chronic biapical pleuroparenchymal scarring. There is evidence of underlying COPD. AICD noted. Old bilateral rib fractures including of the right posterolateral eighth  and ninth ribs. Reverse lordotic projection due to thoracic kyphosis. Mild cardiomegaly. No blunting of the costophrenic angles. Bilateral degenerative glenohumeral  spurring. IMPRESSION: 1. Stable mild enlargement of the cardiopericardial silhouette, without edema. 2. Emphysema noted. 3. Old healed rib fractures. 4. Biapical pleuroparenchymal scarring. 5. Thoracic kyphosis. 6. Bilateral degenerative glenohumeral spurring. Electronically Signed   By: Van Clines M.D.   On: 11/29/2020 16:23    Procedures Procedures   Medications Ordered in ED Medications - No data to display  ED Course  I have reviewed the triage vital signs and the nursing notes.  Pertinent labs & imaging results that were available during my care of the patient were reviewed by me and considered in my medical decision making (see chart for details).  Clinical Course as of 11/29/20 2246  Mon Nov 29, 2020  2016 Patient's cardiac work-up has been unremarkable.  She has been satting well on her normal home oxygen.  She currently denies any complaints.  Recommended close follow-up with her primary care doctor and her cardiologist.  Return instructions discussed [MB]    Clinical Course User Index [MB] Hayden Rasmussen, MD   MDM Rules/Calculators/A&P                          This patient complains of rapid heart rate, shortness of breath, chest discomfort; this involves an extensive number of treatment Options and is a complaint that carries with it a high risk of complications and Morbidity. The differential includes palpitations, arrhythmia, ACS, pneumonia, pneumothorax, PE, CHF  I ordered, reviewed and interpreted labs, which included CBC with normal white count normal hemoglobin, chemistries normal other than elevated creatinine stable from priors, troponins flat, COVID and flu negative, BNP not elevated  I ordered imaging studies which included chest x-ray and I independently    visualized and interpreted imaging  which showed COPD no acute infiltrates Additional history obtained from patient's family member Previous records obtained and reviewed in epic including prior cardiology notes  After the interventions stated above, I reevaluated the patient and found patient to be asymptomatic satting 100% on her regular home oxygen.  Reviewed work-up with her and patient's comfortable plan for discharge and follow-up with her outpatient providers.  Return instructions discussed   Final Clinical Impression(s) / ED Diagnoses Final diagnoses:  Nonspecific chest pain  Palpitations    Rx / DC Orders ED Discharge Orders     None        Hayden Rasmussen, MD 11/29/20 2250

## 2020-11-29 NOTE — ED Triage Notes (Signed)
Pt here POV with c/o of tachycardia, chest pain and increased SOB. Pt taking nitro at home with relief. Alert and oriented X4. Requires 2 liters [place] at baseline.

## 2020-11-29 NOTE — ED Notes (Signed)
RN aware of pt BP

## 2020-11-29 NOTE — Discharge Instructions (Signed)
You were seen in the emergency department for palpitations chest discomfort shortness of breath.  You had lab work EKG and a chest x-ray along with COVID and flu testing that did not show any significant abnormalities.  Please follow-up with your primary care doctor and your cardiologist.  Return to the emergency department if any worsening or concerning symptoms.

## 2020-11-29 NOTE — ED Notes (Signed)
PT ambulated to restroom with steady gait.

## 2020-11-30 ENCOUNTER — Ambulatory Visit: Payer: Medicare HMO | Admitting: Neurology

## 2021-03-31 NOTE — Telephone Encounter (Signed)
Error

## 2021-04-08 ENCOUNTER — Other Ambulatory Visit: Payer: Self-pay

## 2021-04-08 ENCOUNTER — Encounter: Payer: Self-pay | Admitting: Neurology

## 2021-04-08 ENCOUNTER — Ambulatory Visit: Payer: Medicare HMO | Admitting: Neurology

## 2021-04-08 VITALS — BP 111/60 | HR 70 | Ht 64.0 in | Wt 141.0 lb

## 2021-04-08 DIAGNOSIS — G40109 Localization-related (focal) (partial) symptomatic epilepsy and epileptic syndromes with simple partial seizures, not intractable, without status epilepticus: Secondary | ICD-10-CM

## 2021-04-08 DIAGNOSIS — R413 Other amnesia: Secondary | ICD-10-CM

## 2021-04-08 MED ORDER — LAMOTRIGINE 25 MG PO TABS: ORAL_TABLET | ORAL | 3 refills | Status: DC

## 2021-04-08 MED ORDER — LEVETIRACETAM 1000 MG PO TABS: ORAL_TABLET | ORAL | 3 refills | Status: DC

## 2021-04-08 NOTE — Progress Notes (Signed)
? ?NEUROLOGY FOLLOW UP OFFICE NOTE ? ?[name] ?[id number] ?Dec 31, 1940 ? ?HISTORY OF PRESENT ILLNESS: ?I had the pleasure of seeing [name] in follow-up in the neurology clinic on 04/08/2021.  The patient was last seen 10 months ago for seizures. She is again accompanied by her daughter-in-law Juliann Pulse who helps supplement the history today. Since her last visit, they deny any seizures in the past 10 months. She is on Levetiracetam 1000mg  1.5 tabs BID (1500mg  BID) and Lamotrigine 25mg  2 tabs BID (50mg  BID) without side effects. They deny any staring/unresponsive episodes, gaps in time, olfactory/gustatory hallucinations, focal numbness/tingling/weakness, left-sided twitching. She has not needed the prn lorazepam. She reports that her nephrologist took her off the Rivastigmine patch prescribed by her PCP because "she did not like it," and it makes her feel funny. She says it was not working anyway. She manages her own finances without any issues. Juliann Pulse fills her pillbox and sets her medications, she takes them without forgetting doses. She does not drive. She is able to bathe herself, Juliann Pulse is on standby and helps her get dressed because she is usually out of breath when she gets out of the tub. No falls. She is in a wheelchair today but ambulates at home with a cane. She cares for her husband with dementia and states she does not sleep well, last night she did not sleep at all. Juliann Pulse helps during the day and she can sleep then.  ? ? ?History on Initial Assessment 07/28/2014: This is a 81 yo RH woman with a history of cardiomyopathy s/p ICD placement, hypertension, hyperlipidemia, diabetes, paroxysmal atrial fibrillation, who had a fall in December 2012, found to have multiple right extraaxial intracranial hemorrhages and INR >20 while on Coumadin at that time. She had a witnessed seizure in [place] then transferred to [facility] where she had a right craniotomy of evacuation of epidural hematoma.  She had another seizure after the surgery. She was discharged home on Keppra. She had an admission in May 2013 for recurrent seizures, there is note that she was being weaned off seizure medication, then had a seizure and Keppra was restarted. She was reported to have twitching. I personally reviewed head CT done at that time which showed right frontal craniotomy, no residual fluid collection. Remote cortical infarcts/contusions in the right and left occipital regions. She denies any further seizures until 05/24/14 when she started having uncontrollable twitching on the left side of her face and left arm per ER notes. She tells me today that her whole body was shaking. She was awake and alert, able to communicate, denies confusion. She denied focal weakness after. She was given IV Ativan with resolution of twitching. She had a head CT (images unavailable for review) which showed post-surgical right frontal craniotomy changes, chronic right PCA infarct, no acute abnormalities. She had an EEG reported as normal awake and drowsy EEG. She saw her PCP and had an elevated Keppra level on Keppra 750mg  BID, dose was reduced to 500mg  BID. She denies any further episodes of left-sided twitching. She has a prescription for prn Ativan.  ? ?Epilepsy Risk Factors:  Right-sided intracranial hemorrhage in 2012 s/p craniotomy. Otherwise she  had a normal birth and early development.  There is no history of febrile convulsions, CNS infections such as meningitis/encephalitis, or family history of seizures. ? ?PAST MEDICAL HISTORY: ?Past Medical History:  ?Diagnosis Date  ? 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 a.  s/p revision 2/2 ERI  02/28/2011 - [facility] CD 1478  ? Chronic systolic heart failure ([place])   ?    ? COPD (chronic obstructive pulmonary disease) ([place])   ? a. pt reports "scarring" at the lungs due to double PNA in the past.  ? DM2 (diabetes mellitus, type 2) ([place])   ? HLD (hyperlipidemia)   ?  Hypertension   ? Hypokalemia   ? ICD (implantable cardioverter-defibrillator) malfunction 03/22/2016  ? LBBB (left bundle branch block)   ? NICM (nonischemic cardiomyopathy) ([place])   ? a. Initial EF of 35%. b. Normal in 07/2009. c. 35% in 2012, 30% in 12/2011. Reported h/o catheterization done presumably before her ICD implanted at [facility] demonstrated some years ago no obstructive coronary disease. Neg nuc 12/2011.  ? PAF (paroxysmal atrial fibrillation) ([place])   ? a. Not on anticoag due to h/o SDH.  ? Seizures ([place])   ? Subdural hematoma   ? a. 12/12. Hospitalized at [facility], requiring craniotomy, complicated by seizures.  ? Vitamin D deficiency   ? ? ?MEDICATIONS: ?Current Outpatient Medications on File Prior to Visit  ?Medication Sig Dispense Refill  ? alendronate (FOSAMAX) 70 MG tablet Take 70 mg by mouth once a week. Take on  Saturday    ? atorvastatin (LIPITOR) 40 MG tablet Take 40 mg by mouth daily at 6 PM.     ? ergocalciferol (VITAMIN D2) 1.25 MG (50000 UT) capsule Take 1 capsule by mouth once a week. Take on Saturday    ? furosemide (LASIX) 40 MG tablet Take 40 mg by mouth daily.    ? lamoTRIgine (LAMICTAL) 25 MG tablet Take 2 tablets in AM, 2 tablets in PM (Patient taking differently: Take 50 mg by mouth 2 (two) times daily.) 360 tablet 3  ? levETIRAcetam (KEPPRA) 1000 MG tablet Take 1.5 tablets in AM, 1.5 tablets in PM (Patient taking differently: Take 1,500 mg by mouth 2 (two) times daily.) 270 tablet 3  ? levothyroxine (SYNTHROID) 25 MCG tablet Take 25 mcg by mouth daily.    ? LORazepam (ATIVAN) 1 MG tablet Take 1 tablet as needed for seizure. Do not take more than 3 a week. 10 tablet 5  ? metoprolol succinate (TOPROL-XL) 25 MG 24 hr tablet Take 25 mg by mouth daily.    ? nitroGLYCERIN (NITROSTAT) 0.4 MG SL tablet Place 0.4 mg under the tongue every 5 (five) minutes as needed for chest pain (MAX 3 TABLETS).    ? omeprazole (PRILOSEC) 40 MG capsule Take 1 capsule by mouth 2 (two) times daily.    ? OXYGEN  Inhale 2 L into the lungs daily.    ? potassium chloride (K-DUR) 10 MEQ tablet Take 10 mEq by mouth daily.    ? rivastigmine (EXELON) 4.6 mg/24hr Place 4.6 mg onto the skin daily.    ? sacubitril-valsartan (ENTRESTO) 24-26 MG Take 1 tablet by mouth 2 (two) times daily.    ? sitaGLIPtan (JANUVIA) 100 MG tablet Take 100 mg by mouth daily.    ? spironolactone (ALDACTONE) 25 MG tablet Take 25 mg by mouth daily.    ? VASCEPA 1 g capsule Take 2 g by mouth 2 (two) times daily.    ? vitamin B-12 (CYANOCOBALAMIN) 100 MCG tablet Take 50 mcg by mouth daily.    ? ?No current facility-administered medications on file prior to visit.  ? ? ?ALLERGIES: ?Allergies  ?Allergen Reactions  ? Codeine Other (See Comments)  ?  "puts me on a trip"  ? Gemfibrozil Diarrhea  ? Lactose Intolerance (Gi)  Diarrhea  ? Lactose Other (See Comments)  ? Morphine And Related Other (See Comments)  ?  unknown  ? Valium Hives and Rash  ? ? ?FAMILY HISTORY: ?Family History  ?Problem Relation Age of Onset  ? Heart disease Mother   ? Heart disease Father   ? Heart disease Other   ? ? ?SOCIAL HISTORY: ?Social History  ? ?Socioeconomic History  ? Marital status: Married  ?  Spouse name: Not on file  ? Number of children: 5  ? Years of education: Not on file  ? Highest education level: Not on file  ?Occupational History  ? Occupation: retired  ?Tobacco Use  ? Smoking status: Former  ?  Types: Cigarettes  ?  Quit date: 01/17/1968  ?  Years since quitting: 53.2  ? Smokeless tobacco: Never  ?Vaping Use  ? Vaping Use: Never used  ?Substance and Sexual Activity  ? Alcohol use: No  ?  Alcohol/week: 0.0 standard drinks  ? Drug use: No  ? Sexual activity: Not Currently  ?  Partners: Male  ?Other Topics Concern  ? Not on file  ?Social History Narrative  ? Registration notes - ICD = St. Jude  ?   ? Lives with husband and oldest son and daughter in law  ?   ? One story home  ?   ? Right handed  ?   ? Highest Level of [employer]- 10th grade  ? ?Social Determinants of Health   ? ?Financial Resource Strain: Not on file  ?Food Insecurity: Not on file  ?Transportation Needs: Not on file  ?Physical Activity: Not on file  ?Stress: Not on file  ?Social Connections: Not on file  ?Intimate Partner Vi

## 2021-04-08 NOTE — Patient Instructions (Signed)
Good to see you. ? ?Continue Levetiracetam 1000mg : Take 1 and 1/2 tablets twice a day ? ?2. Continue Lamotrigine 25mg : take 2 tablets twice a day ? ?3. There is no indication for dementia medication at this time ? ?4. Follow-up in 1 year, call for any changes ? ? ?Seizure Precautions: ?1. If medication has been prescribed for you to prevent seizures, take it exactly as directed.  Do not stop taking the medicine without talking to your doctor first, even if you have not had a seizure in a long time.  ? ?2. Avoid activities in which a seizure would cause danger to yourself or to others.  Don't operate dangerous machinery, swim alone, or climb in high or dangerous places, such as on ladders, roofs, or girders.  Do not drive unless your doctor says you may. ? ?3. If you have any warning that you may have a seizure, lay down in a safe place where you can't hurt yourself.   ? ?4.  No driving for 6 months from last seizure, as per [facility].   Please refer to the following link on the [place] website for more information: http://[email or web address]/[email or web address]/drivingu.cfm  ? ?5.  Maintain good sleep hygiene. Avoid alcohol. ? ?6.  Contact your doctor if you have any problems that may be related to the medicine you are taking. ? ?7.  Call 911 and bring the patient back to the ED if: ?      ? A.  The seizure lasts longer than 5 minutes.      ? B.  The patient doesn't awaken shortly after the seizure ? C.  The patient has new problems such as difficulty seeing, speaking or moving ? D.  The patient was injured during the seizure ? E.  The patient has a temperature over 102 F (39C) ? F.  The patient vomited and now is having trouble breathing ?      ? ? ?RECOMMENDATIONS FOR ALL PATIENTS WITH MEMORY PROBLEMS: ?1. Continue to exercise (Recommend 30 minutes of walking everyday, or 3 hours every week) ?2. Increase social interactions - continue going to [place] and enjoy  social gatherings with friends and family ?3. Eat healthy, avoid fried foods and eat more fruits and vegetables ?4. Maintain adequate blood pressure, blood sugar, and blood cholesterol level. Reducing the risk of stroke and cardiovascular disease also helps promoting better memory. ?5. Avoid stressful situations. Live a simple life and avoid aggravations. Organize your time and prepare for the next day in anticipation. ?6. Sleep well, avoid any interruptions of sleep and avoid any distractions in the bedroom that may interfere with adequate sleep quality ?7. Avoid sugar, avoid sweets as there is a strong link between excessive sugar intake, diabetes, and cognitive impairment ?We discussed the Mediterranean diet, which has been shown to help patients reduce the risk of progressive memory disorders and reduces cardiovascular risk. This includes eating fish, eat fruits and green leafy vegetables, nuts like almonds and hazelnuts, walnuts, and also use olive oil. Avoid fast foods and fried foods as much as possible. Avoid sweets and sugar as sugar use has been linked to worsening of memory function. ? ?

## 2021-12-02 ENCOUNTER — Other Ambulatory Visit: Payer: Self-pay

## 2021-12-02 ENCOUNTER — Telehealth: Payer: Self-pay | Admitting: Neurology

## 2021-12-02 DIAGNOSIS — G40109 Localization-related (focal) (partial) symptomatic epilepsy and epileptic syndromes with simple partial seizures, not intractable, without status epilepticus: Secondary | ICD-10-CM

## 2021-12-02 NOTE — Telephone Encounter (Signed)
She didn't any medicine this morning, but she took it last night at 8pm. She hasn't had any seizures. The HA started a couple days ago. She gets weak and dizzy at times.

## 2021-12-02 NOTE — Telephone Encounter (Signed)
Pls ask patient what time she took her morning medications that day and if she is having any seizures. What other symptoms does she have, when did headache start, thanks

## 2021-12-02 NOTE — Telephone Encounter (Signed)
Recommend rechecking Keppra level at either 3pm or 4pm (right before lab closes). Would not change dose until we recheck level. Weakness and dizziness can be anything, her bloodwork showed dehydration which can also cause weakness and dizziness.

## 2021-12-02 NOTE — Telephone Encounter (Signed)
Pls call the patient, her level can be high because she had just taken her medication. What time does she usually take her morning medications? If she takes it before 8am (bloodwork was collected at 8am), then the level would be high because she just took it and not because she is on too much medication.

## 2021-12-02 NOTE — Telephone Encounter (Signed)
Pt called in and left a message with the access nurse. She stated her seizure medication prescription needs to be decreased. Her blood work was off so she needs to know what amount she was taking. Her PCP has also faxed the office about this as well. She has also felt weak and has had a headache. Full access nurse report is in Dr. Amparo Bristol box

## 2021-12-02 NOTE — Telephone Encounter (Signed)
I advised to patient and ordered keppra level. Patient thanked me for calling.

## 2021-12-06 ENCOUNTER — Other Ambulatory Visit (INDEPENDENT_AMBULATORY_CARE_PROVIDER_SITE_OTHER): Payer: Medicare HMO

## 2021-12-06 DIAGNOSIS — G40109 Localization-related (focal) (partial) symptomatic epilepsy and epileptic syndromes with simple partial seizures, not intractable, without status epilepticus: Secondary | ICD-10-CM

## 2021-12-14 LAB — LEVETIRACETAM LEVEL: Keppra (Levetiracetam): 174 ug/mL — ABNORMAL HIGH

## 2021-12-16 ENCOUNTER — Telehealth: Payer: Self-pay

## 2021-12-16 NOTE — Telephone Encounter (Signed)
Pt called informed that The repeat Keppra level is still elevated but lower than the last one. If she is not feeling well, we can reduce the dose a little to 1000mg : Take 1 tablet in AM, 1.5 tablets in PM. Monitor for any seizures as we reduce dose. Pt verbalized understanding,

## 2021-12-16 NOTE — Telephone Encounter (Signed)
-----   Message from Cameron Sprang, MD sent at 12/15/2021  4:03 PM EST ----- Pls see how she is doing. The repeat Keppra level is still elevated but lower than the last one. If she is not feeling well, we can reduce the dose a little to 1000mg : Take 1 tablet in AM, 1.5 tablets in PM. Monitor for any seizures as we reduce dose. Thanks

## 2022-01-07 DIAGNOSIS — D518 Other vitamin B12 deficiency anemias: Secondary | ICD-10-CM | POA: Diagnosis not present

## 2022-01-07 DIAGNOSIS — E782 Mixed hyperlipidemia: Secondary | ICD-10-CM | POA: Diagnosis not present

## 2022-01-07 DIAGNOSIS — E559 Vitamin D deficiency, unspecified: Secondary | ICD-10-CM | POA: Diagnosis not present

## 2022-01-07 DIAGNOSIS — M810 Age-related osteoporosis without current pathological fracture: Secondary | ICD-10-CM | POA: Diagnosis not present

## 2022-01-07 DIAGNOSIS — J449 Chronic obstructive pulmonary disease, unspecified: Secondary | ICD-10-CM | POA: Diagnosis not present

## 2022-01-07 DIAGNOSIS — E038 Other specified hypothyroidism: Secondary | ICD-10-CM | POA: Diagnosis not present

## 2022-01-07 DIAGNOSIS — I5023 Acute on chronic systolic (congestive) heart failure: Secondary | ICD-10-CM | POA: Diagnosis not present

## 2022-01-07 DIAGNOSIS — I10 Essential (primary) hypertension: Secondary | ICD-10-CM | POA: Diagnosis not present

## 2022-01-07 DIAGNOSIS — E1165 Type 2 diabetes mellitus with hyperglycemia: Secondary | ICD-10-CM | POA: Diagnosis not present

## 2022-01-10 DIAGNOSIS — F419 Anxiety disorder, unspecified: Secondary | ICD-10-CM | POA: Diagnosis not present

## 2022-01-13 DIAGNOSIS — I10 Essential (primary) hypertension: Secondary | ICD-10-CM | POA: Diagnosis not present

## 2022-01-31 DIAGNOSIS — F419 Anxiety disorder, unspecified: Secondary | ICD-10-CM | POA: Diagnosis not present

## 2022-02-03 DIAGNOSIS — J449 Chronic obstructive pulmonary disease, unspecified: Secondary | ICD-10-CM | POA: Diagnosis not present

## 2022-02-03 DIAGNOSIS — M810 Age-related osteoporosis without current pathological fracture: Secondary | ICD-10-CM | POA: Diagnosis not present

## 2022-02-03 DIAGNOSIS — E559 Vitamin D deficiency, unspecified: Secondary | ICD-10-CM | POA: Diagnosis not present

## 2022-02-03 DIAGNOSIS — I480 Paroxysmal atrial fibrillation: Secondary | ICD-10-CM | POA: Diagnosis not present

## 2022-02-03 DIAGNOSIS — E782 Mixed hyperlipidemia: Secondary | ICD-10-CM | POA: Diagnosis not present

## 2022-02-03 DIAGNOSIS — D518 Other vitamin B12 deficiency anemias: Secondary | ICD-10-CM | POA: Diagnosis not present

## 2022-02-03 DIAGNOSIS — I10 Essential (primary) hypertension: Secondary | ICD-10-CM | POA: Diagnosis not present

## 2022-02-03 DIAGNOSIS — E038 Other specified hypothyroidism: Secondary | ICD-10-CM | POA: Diagnosis not present

## 2022-02-03 DIAGNOSIS — E1165 Type 2 diabetes mellitus with hyperglycemia: Secondary | ICD-10-CM | POA: Diagnosis not present

## 2022-02-08 DIAGNOSIS — E1122 Type 2 diabetes mellitus with diabetic chronic kidney disease: Secondary | ICD-10-CM | POA: Diagnosis not present

## 2022-02-08 DIAGNOSIS — I130 Hypertensive heart and chronic kidney disease with heart failure and stage 1 through stage 4 chronic kidney disease, or unspecified chronic kidney disease: Secondary | ICD-10-CM | POA: Diagnosis not present

## 2022-02-08 DIAGNOSIS — I509 Heart failure, unspecified: Secondary | ICD-10-CM | POA: Diagnosis not present

## 2022-02-08 DIAGNOSIS — E785 Hyperlipidemia, unspecified: Secondary | ICD-10-CM | POA: Diagnosis not present

## 2022-02-08 DIAGNOSIS — N184 Chronic kidney disease, stage 4 (severe): Secondary | ICD-10-CM | POA: Diagnosis not present

## 2022-02-13 DIAGNOSIS — I10 Essential (primary) hypertension: Secondary | ICD-10-CM | POA: Diagnosis not present

## 2022-02-27 ENCOUNTER — Other Ambulatory Visit: Payer: Self-pay

## 2022-02-27 DIAGNOSIS — G451 Carotid artery syndrome (hemispheric): Secondary | ICD-10-CM | POA: Insufficient documentation

## 2022-02-27 DIAGNOSIS — G40909 Epilepsy, unspecified, not intractable, without status epilepticus: Secondary | ICD-10-CM

## 2022-02-27 DIAGNOSIS — D518 Other vitamin B12 deficiency anemias: Secondary | ICD-10-CM

## 2022-02-27 DIAGNOSIS — I6523 Occlusion and stenosis of bilateral carotid arteries: Secondary | ICD-10-CM | POA: Insufficient documentation

## 2022-02-27 DIAGNOSIS — F419 Anxiety disorder, unspecified: Secondary | ICD-10-CM

## 2022-02-27 DIAGNOSIS — N189 Chronic kidney disease, unspecified: Secondary | ICD-10-CM

## 2022-02-27 DIAGNOSIS — I739 Peripheral vascular disease, unspecified: Secondary | ICD-10-CM | POA: Insufficient documentation

## 2022-02-27 DIAGNOSIS — I5023 Acute on chronic systolic (congestive) heart failure: Secondary | ICD-10-CM | POA: Insufficient documentation

## 2022-02-27 DIAGNOSIS — R0602 Shortness of breath: Secondary | ICD-10-CM | POA: Insufficient documentation

## 2022-02-27 DIAGNOSIS — I77811 Abdominal aortic ectasia: Secondary | ICD-10-CM

## 2022-02-27 DIAGNOSIS — I480 Paroxysmal atrial fibrillation: Secondary | ICD-10-CM | POA: Insufficient documentation

## 2022-02-27 DIAGNOSIS — F0390 Unspecified dementia without behavioral disturbance: Secondary | ICD-10-CM

## 2022-02-27 DIAGNOSIS — I714 Abdominal aortic aneurysm, without rupture, unspecified: Secondary | ICD-10-CM

## 2022-02-27 DIAGNOSIS — R031 Nonspecific low blood-pressure reading: Secondary | ICD-10-CM | POA: Insufficient documentation

## 2022-02-27 DIAGNOSIS — K219 Gastro-esophageal reflux disease without esophagitis: Secondary | ICD-10-CM

## 2022-02-27 DIAGNOSIS — K2100 Gastro-esophageal reflux disease with esophagitis, without bleeding: Secondary | ICD-10-CM

## 2022-02-27 DIAGNOSIS — E042 Nontoxic multinodular goiter: Secondary | ICD-10-CM | POA: Insufficient documentation

## 2022-02-27 DIAGNOSIS — Z8673 Personal history of transient ischemic attack (TIA), and cerebral infarction without residual deficits: Secondary | ICD-10-CM

## 2022-02-27 DIAGNOSIS — Z5181 Encounter for therapeutic drug level monitoring: Secondary | ICD-10-CM

## 2022-02-27 DIAGNOSIS — I700 Atherosclerosis of aorta: Secondary | ICD-10-CM

## 2022-02-27 DIAGNOSIS — M545 Low back pain, unspecified: Secondary | ICD-10-CM | POA: Insufficient documentation

## 2022-02-27 DIAGNOSIS — E785 Hyperlipidemia, unspecified: Secondary | ICD-10-CM | POA: Insufficient documentation

## 2022-02-27 DIAGNOSIS — E119 Type 2 diabetes mellitus without complications: Secondary | ICD-10-CM

## 2022-02-27 DIAGNOSIS — E559 Vitamin D deficiency, unspecified: Secondary | ICD-10-CM | POA: Insufficient documentation

## 2022-02-27 DIAGNOSIS — E1165 Type 2 diabetes mellitus with hyperglycemia: Secondary | ICD-10-CM | POA: Insufficient documentation

## 2022-02-27 DIAGNOSIS — M6281 Muscle weakness (generalized): Secondary | ICD-10-CM

## 2022-02-27 DIAGNOSIS — N1832 Chronic kidney disease, stage 3b: Secondary | ICD-10-CM | POA: Insufficient documentation

## 2022-02-27 DIAGNOSIS — I70219 Atherosclerosis of native arteries of extremities with intermittent claudication, unspecified extremity: Secondary | ICD-10-CM | POA: Insufficient documentation

## 2022-02-27 DIAGNOSIS — M810 Age-related osteoporosis without current pathological fracture: Secondary | ICD-10-CM

## 2022-02-27 DIAGNOSIS — G2581 Restless legs syndrome: Secondary | ICD-10-CM | POA: Insufficient documentation

## 2022-02-27 DIAGNOSIS — G603 Idiopathic progressive neuropathy: Secondary | ICD-10-CM

## 2022-02-27 DIAGNOSIS — E781 Pure hyperglyceridemia: Secondary | ICD-10-CM | POA: Insufficient documentation

## 2022-02-27 DIAGNOSIS — G4700 Insomnia, unspecified: Secondary | ICD-10-CM | POA: Insufficient documentation

## 2022-02-27 DIAGNOSIS — I209 Angina pectoris, unspecified: Secondary | ICD-10-CM

## 2022-02-27 DIAGNOSIS — I70229 Atherosclerosis of native arteries of extremities with rest pain, unspecified extremity: Secondary | ICD-10-CM

## 2022-02-27 DIAGNOSIS — E782 Mixed hyperlipidemia: Secondary | ICD-10-CM | POA: Insufficient documentation

## 2022-02-27 DIAGNOSIS — E039 Hypothyroidism, unspecified: Secondary | ICD-10-CM

## 2022-02-27 DIAGNOSIS — Z79899 Other long term (current) drug therapy: Secondary | ICD-10-CM | POA: Insufficient documentation

## 2022-02-27 HISTORY — DX: Epilepsy, unspecified, not intractable, without status epilepticus: G40.909

## 2022-02-27 HISTORY — DX: Age-related osteoporosis without current pathological fracture: M81.0

## 2022-02-27 HISTORY — DX: Mixed hyperlipidemia: E78.2

## 2022-02-27 HISTORY — DX: Anxiety disorder, unspecified: F41.9

## 2022-02-27 HISTORY — DX: Hypothyroidism, unspecified: E03.9

## 2022-02-27 HISTORY — DX: Personal history of transient ischemic attack (TIA), and cerebral infarction without residual deficits: Z86.73

## 2022-02-27 HISTORY — DX: Other vitamin B12 deficiency anemias: D51.8

## 2022-02-27 HISTORY — DX: Carotid artery syndrome (hemispheric): G45.1

## 2022-02-27 HISTORY — DX: Abdominal aortic aneurysm, without rupture, unspecified: I71.40

## 2022-02-27 HISTORY — DX: Type 2 diabetes mellitus with hyperglycemia: E11.65

## 2022-02-27 HISTORY — DX: Abdominal aortic ectasia: I77.811

## 2022-02-27 HISTORY — DX: Low back pain, unspecified: M54.50

## 2022-02-27 HISTORY — DX: Gastro-esophageal reflux disease without esophagitis: K21.9

## 2022-02-27 HISTORY — DX: Chronic kidney disease, stage 3b: N18.32

## 2022-02-27 HISTORY — DX: Nontoxic multinodular goiter: E04.2

## 2022-02-27 HISTORY — DX: Encounter for therapeutic drug level monitoring: Z51.81

## 2022-02-27 HISTORY — DX: Atherosclerosis of native arteries of extremities with rest pain, unspecified extremity: I70.229

## 2022-02-27 HISTORY — DX: Restless legs syndrome: G25.81

## 2022-02-27 HISTORY — DX: Occlusion and stenosis of bilateral carotid arteries: I65.23

## 2022-02-27 HISTORY — DX: Atherosclerosis of aorta: I70.0

## 2022-02-27 HISTORY — DX: Gastro-esophageal reflux disease with esophagitis, without bleeding: K21.00

## 2022-02-27 HISTORY — DX: Shortness of breath: R06.02

## 2022-02-27 HISTORY — DX: Pure hyperglyceridemia: E78.1

## 2022-02-27 HISTORY — DX: Muscle weakness (generalized): M62.81

## 2022-02-27 HISTORY — DX: Chronic kidney disease, unspecified: N18.9

## 2022-02-27 HISTORY — DX: Insomnia, unspecified: G47.00

## 2022-02-27 HISTORY — DX: Other long term (current) drug therapy: Z79.899

## 2022-02-27 HISTORY — DX: Vitamin D deficiency, unspecified: E55.9

## 2022-02-27 HISTORY — DX: Unspecified dementia, unspecified severity, without behavioral disturbance, psychotic disturbance, mood disturbance, and anxiety: F03.90

## 2022-02-27 HISTORY — DX: Type 2 diabetes mellitus without complications: E11.9

## 2022-02-27 HISTORY — DX: Idiopathic progressive neuropathy: G60.3

## 2022-02-27 HISTORY — DX: Peripheral vascular disease, unspecified: I73.9

## 2022-02-27 HISTORY — DX: Angina pectoris, unspecified: I20.9

## 2022-02-27 HISTORY — DX: Atherosclerosis of native arteries of extremities with intermittent claudication, unspecified extremity: I70.219

## 2022-03-01 DIAGNOSIS — Z79899 Other long term (current) drug therapy: Secondary | ICD-10-CM | POA: Diagnosis not present

## 2022-03-01 DIAGNOSIS — E1165 Type 2 diabetes mellitus with hyperglycemia: Secondary | ICD-10-CM | POA: Diagnosis not present

## 2022-03-01 DIAGNOSIS — D518 Other vitamin B12 deficiency anemias: Secondary | ICD-10-CM | POA: Diagnosis not present

## 2022-03-01 DIAGNOSIS — E038 Other specified hypothyroidism: Secondary | ICD-10-CM | POA: Diagnosis not present

## 2022-03-01 DIAGNOSIS — E782 Mixed hyperlipidemia: Secondary | ICD-10-CM | POA: Diagnosis not present

## 2022-03-01 DIAGNOSIS — I10 Essential (primary) hypertension: Secondary | ICD-10-CM | POA: Diagnosis not present

## 2022-03-08 DIAGNOSIS — M545 Low back pain, unspecified: Secondary | ICD-10-CM | POA: Diagnosis not present

## 2022-03-08 DIAGNOSIS — J449 Chronic obstructive pulmonary disease, unspecified: Secondary | ICD-10-CM | POA: Diagnosis not present

## 2022-03-08 DIAGNOSIS — N183 Chronic kidney disease, stage 3 unspecified: Secondary | ICD-10-CM | POA: Diagnosis not present

## 2022-03-08 DIAGNOSIS — I10 Essential (primary) hypertension: Secondary | ICD-10-CM | POA: Diagnosis not present

## 2022-03-08 DIAGNOSIS — E1165 Type 2 diabetes mellitus with hyperglycemia: Secondary | ICD-10-CM | POA: Diagnosis not present

## 2022-03-09 DIAGNOSIS — D518 Other vitamin B12 deficiency anemias: Secondary | ICD-10-CM | POA: Diagnosis not present

## 2022-03-09 DIAGNOSIS — E782 Mixed hyperlipidemia: Secondary | ICD-10-CM | POA: Diagnosis not present

## 2022-03-09 DIAGNOSIS — E559 Vitamin D deficiency, unspecified: Secondary | ICD-10-CM | POA: Diagnosis not present

## 2022-03-09 DIAGNOSIS — E1165 Type 2 diabetes mellitus with hyperglycemia: Secondary | ICD-10-CM | POA: Diagnosis not present

## 2022-03-09 DIAGNOSIS — I10 Essential (primary) hypertension: Secondary | ICD-10-CM | POA: Diagnosis not present

## 2022-03-09 DIAGNOSIS — I480 Paroxysmal atrial fibrillation: Secondary | ICD-10-CM | POA: Diagnosis not present

## 2022-03-09 DIAGNOSIS — J449 Chronic obstructive pulmonary disease, unspecified: Secondary | ICD-10-CM | POA: Diagnosis not present

## 2022-03-09 DIAGNOSIS — M810 Age-related osteoporosis without current pathological fracture: Secondary | ICD-10-CM | POA: Diagnosis not present

## 2022-03-09 DIAGNOSIS — I5023 Acute on chronic systolic (congestive) heart failure: Secondary | ICD-10-CM | POA: Diagnosis not present

## 2022-03-16 DIAGNOSIS — I10 Essential (primary) hypertension: Secondary | ICD-10-CM | POA: Diagnosis not present

## 2022-03-17 ENCOUNTER — Other Ambulatory Visit: Payer: Self-pay

## 2022-03-22 ENCOUNTER — Ambulatory Visit: Payer: Medicare HMO | Attending: Cardiology | Admitting: Cardiology

## 2022-03-22 ENCOUNTER — Encounter: Payer: Self-pay | Admitting: Cardiology

## 2022-03-22 VITALS — BP 144/80 | HR 82 | Ht 64.0 in | Wt 153.2 lb

## 2022-03-22 DIAGNOSIS — E1100 Type 2 diabetes mellitus with hyperosmolarity without nonketotic hyperglycemic-hyperosmolar coma (NKHHC): Secondary | ICD-10-CM | POA: Diagnosis not present

## 2022-03-22 DIAGNOSIS — I5023 Acute on chronic systolic (congestive) heart failure: Secondary | ICD-10-CM | POA: Diagnosis not present

## 2022-03-22 DIAGNOSIS — E782 Mixed hyperlipidemia: Secondary | ICD-10-CM

## 2022-03-22 DIAGNOSIS — I10 Essential (primary) hypertension: Secondary | ICD-10-CM

## 2022-03-22 DIAGNOSIS — T82118A Breakdown (mechanical) of other cardiac electronic device, initial encounter: Secondary | ICD-10-CM

## 2022-03-22 DIAGNOSIS — I2510 Atherosclerotic heart disease of native coronary artery without angina pectoris: Secondary | ICD-10-CM | POA: Diagnosis not present

## 2022-03-22 DIAGNOSIS — I714 Abdominal aortic aneurysm, without rupture, unspecified: Secondary | ICD-10-CM

## 2022-03-22 DIAGNOSIS — F419 Anxiety disorder, unspecified: Secondary | ICD-10-CM | POA: Diagnosis not present

## 2022-03-22 DIAGNOSIS — I428 Other cardiomyopathies: Secondary | ICD-10-CM | POA: Diagnosis not present

## 2022-03-22 DIAGNOSIS — Z95810 Presence of automatic (implantable) cardiac defibrillator: Secondary | ICD-10-CM

## 2022-03-22 NOTE — Patient Instructions (Signed)
Medication Instructions:  Your physician recommends that you continue on your current medications as directed. Please refer to the Current Medication list given to you today.  *If you need a refill on your cardiac medications before your next appointment, please call your pharmacy*   Lab Work: None ordered If you have labs (blood work) drawn today and your tests are completely normal, you will receive your results only by: [place] (if you have MyChart) OR A paper copy in the mail If you have any lab test that is abnormal or we need to change your treatment, we will call you to review the results.   Testing/Procedures: Your physician has requested that you have an echocardiogram. Echocardiography is a painless test that uses sound waves to create images of your heart. It provides your doctor with information about the size and shape of your heart and how well your heart's chambers and valves are working. This procedure takes approximately one hour. There are no restrictions for this procedure. Please do NOT wear cologne, perfume, aftershave, or lotions (deodorant is allowed). Please arrive 15 minutes prior to your appointment time.  Your physician has requested that you have an abdominal aorta duplex. During this test, an ultrasound is used to evaluate the aorta. Allow 30 minutes for this exam. Do not eat after midnight the day before and avoid carbonated beverages   Follow-Up: At [facility], you and your health needs are our priority.  As part of our continuing mission to provide you with exceptional heart care, we have created designated Provider Care Teams.  These Care Teams include your primary Cardiologist (physician) and Advanced Practice Providers (APPs -  Physician Assistants and Nurse Practitioners) who all work together to provide you with the care you need, when you need it.  We recommend signing up for the patient portal called "MyChart".  Sign up information is  provided on this After Visit Summary.  MyChart is used to connect with patients for Virtual Visits (Telemedicine).  Patients are able to view lab/test results, encounter notes, upcoming appointments, etc.  Non-urgent messages can be sent to your provider as well.   To learn more about what you can do with MyChart, go to [email or web address].    Your next appointment:   9 month(s)  The format for your next appointment:   In Person  Provider:   Jyl Heinz, MD   Other Instructions Echocardiogram An echocardiogram is a test that uses sound waves (ultrasound) to produce images of the heart. Images from an echocardiogram can provide important information about: Heart size and shape. The size and thickness and movement of your heart's walls. Heart muscle function and strength. Heart valve function or if you have stenosis. Stenosis is when the heart valves are too narrow. If blood is flowing backward through the heart valves (regurgitation). A tumor or infectious growth around the heart valves. Areas of heart muscle that are not working well because of poor blood flow or injury from a heart attack. Aneurysm detection. An aneurysm is a weak or damaged part of an artery wall. The wall bulges out from the normal force of blood pumping through the body. Tell a health care provider about: Any allergies you have. All medicines you are taking, including vitamins, herbs, eye drops, creams, and over-the-counter medicines. Any blood disorders you have. Any surgeries you have had. Any medical conditions you have. Whether you are pregnant or may be pregnant. What are the risks? Generally, this is a safe test. However, problems  may occur, including an allergic reaction to dye (contrast) that may be used during the test. What happens before the test? No specific preparation is needed. You may eat and drink normally. What happens during the test? You will take off your clothes from the waist up  and put on a hospital gown. Electrodes or electrocardiogram (ECG)patches may be placed on your chest. The electrodes or patches are then connected to a device that monitors your heart rate and rhythm. You will lie down on a table for an ultrasound exam. A gel will be applied to your chest to help sound waves pass through your skin. A handheld device, called a transducer, will be pressed against your chest and moved over your heart. The transducer produces sound waves that travel to your heart and bounce back (or "echo" back) to the transducer. These sound waves will be captured in real-time and changed into images of your heart that can be viewed on a video monitor. The images will be recorded on a computer and reviewed by your health care provider. You may be asked to change positions or hold your breath for a short time. This makes it easier to get different views or better views of your heart. In some cases, you may receive contrast through an IV in one of your veins. This can improve the quality of the pictures from your heart. The procedure may vary among health care providers and hospitals.   What can I expect after the test? You may return to your normal, everyday life, including diet, activities, and medicines, unless your health care provider tells you not to do that. Follow these instructions at home: It is up to you to get the results of your test. Ask your health care provider, or the department that is doing the test, when your results will be ready. Keep all follow-up visits. This is important. Summary An echocardiogram is a test that uses sound waves (ultrasound) to produce images of the heart. Images from an echocardiogram can provide important information about the size and shape of your heart, heart muscle function, heart valve function, and other possible heart problems. You do not need to do anything to prepare before this test. You may eat and drink normally. After the  echocardiogram is completed, you may return to your normal, everyday life, unless your health care provider tells you not to do that. This information is not intended to replace advice given to you by your health care provider. Make sure you discuss any questions you have with your health care provider. Document Revised: 08/26/2019 Document Reviewed: 08/26/2019 Elsevier Patient Education  [place]

## 2022-03-22 NOTE — Progress Notes (Signed)
Cardiology Office Note:    Date:  03/22/2022   ID:  [name], DOB 09/09/1940, MRN [id number]  PCP:  Bonnita Nasuti, MD  Cardiologist:  Jenean Lindau, MD   Referring MD: Laverle Hobby, NP    ASSESSMENT:    1. Acute on chronic systolic (congestive) heart failure ([place])   2. Chronic coronary artery disease   3. Essential hypertension   4. NICM (nonischemic cardiomyopathy) ([place])   5. Type 2 diabetes mellitus with hyperosmolarity without coma, without long-term current use of insulin ([place])   6. ICD (implantable cardioverter-defibrillator) malfunction, initial encounter   7. Mixed hyperlipidemia   8. Biventricular implantable cardioverter-defibrillator in situ    PLAN:    In order of problems listed above:  Coronary artery disease: Secondary prevention stressed with the patient.  Importance of compliance with diet medication stressed and she vocalized understanding. Cardiomyopathy: History of congestive heart failure: Post intracardiac defibrillator: I discussed this with the patient at length.  CHF education was given.  She is very meticulous about her blood pressure and weight checks and has brought me a record.  Salt intake issues were discussed. Essential hypertension: Blood pressure stable and diet was emphasized.  I reviewed home blood pressures and they are excellent. Mixed dyslipidemia: On lipid-lowering medications followed by primary care. Intracardiac defibrillator: Managed by electrophysiology colleagues. COPD on oxygen: Managed by primary care and pulmonary. Mention of intra-abdominal aneurysm.  I did not find any details and will do ultrasound to assess this. Patient will be seen in follow-up appointment in 6 months or earlier if the patient has any concerns    Medication Adjustments/Labs and Tests Ordered: Current medicines are reviewed at length with the patient today.  Concerns regarding medicines are outlined above.  No orders of the defined types were placed  in this encounter.  No orders of the defined types were placed in this encounter.    No chief complaint on file.    History of Present Illness:    [name] is a 82 y.o. female.  Patient has past medical history of cardiomyopathy, essential hypertension, mixed dyslipidemia COPD on continuous supplemental oxygen.  She has seen her partners in [place] she is relocating her care to [place] here in [place].  She is accompanied by her family member.  She denies any chest pain orthopnea or PND.  She is stable at low level and has multiple medical issues.  She has a defibrillator in place followed by electrophysiology colleagues.  At the time of my evaluation, the patient is alert awake oriented and in no distress.  She has renal insufficiency which is significant.  Past Medical History:  Diagnosis Date   Abdominal aortic aneurysm without rupture ([place]) 02/27/2022   Abdominal aortic ectasia (HCC) 02/27/2022   Acute on chronic systolic (congestive) heart failure (HCC)    Age-related osteoporosis without current pathological fracture 02/27/2022   Anemia 01/03/2011   Angina pectoris ([place]) 02/27/2022   Anxiety disorder 02/27/2022   Atherosclerosis of native arteries of extremities with intermittent claudication, unspecified extremity ([place]) 02/27/2022   Atherosclerosis of native arteries of extremities with rest pain, unspecified extremity ([place]) 02/27/2022   Atrial fibrillation ([place]) 12/23/2010   Biventricular ICD (implantable cardiac defibrillator) in place    a.  s/p revision 2/2 ERI 02/28/2011 - [facility] CD 3257   Biventricular implantable cardioverter-defibrillator in situ    a.  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/27/2022   Chronic coronary artery  disease 12/23/2010   Chronic kidney disease 02/27/2022   Chronic kidney disease, stage 3b ([place]) 02/27/2022   Chronic obstructive pulmonary disease ([place]) 0000000   Chronic systolic heart failure  ([place])        Dementia ([place]) 02/27/2022   Diarrhea 01/03/2011   DM2 (diabetes mellitus, type 2) ([place])    Encounter for therapeutic drug level monitoring 02/27/2022   Epilepsy ([place]) 02/27/2022   Essential hypertension 12/23/2010   Fatigue 06/11/2012   Gastritis 01/03/2011   Gastro-esophageal reflux disease with esophagitis 02/27/2022   Gastro-esophageal reflux disease without esophagitis 02/27/2022   Hardening of the aorta (main artery of the heart) ([place]) 02/27/2022   History of subdural hematoma 07/28/2014   HLD (hyperlipidemia)    Hypercholesterolemia 12/23/2010   Hyperglycemia due to type 2 diabetes mellitus ([place]) 02/27/2022   Hypertension    Hypokalemia    Hypomagnesemia 01/03/2011   Hypothyroidism 02/27/2022   ICD (implantable cardioverter-defibrillator) malfunction, initial encounter 03/22/2016   Idiopathic progressive polyneuropathy 02/27/2022   Insomnia 02/27/2022   LBBB (left bundle branch block)    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 Initial EF of 35%. b. Normal in 07/2009. c. 35% in 2012, 30% in 12/2011. Reported h/o catheterization done presumably before her ICD implanted at [facility] demonstrated some years ago no obstructive coronary disease. Neg nuc 12/2011.   Non-toxic multinodular goiter 02/27/2022   Nonspecific low blood pressure reading    Occlusion and stenosis of bilateral carotid arteries 02/27/2022   Other long term (current) drug therapy 02/27/2022   Other vitamin B12 deficiency anemias 02/27/2022   PAF (paroxysmal atrial fibrillation) ([place])    a. Not on anticoag due to h/o SDH.   Peripheral vascular disease ([place]) 02/27/2022   Personal history of transient ischemic attack (TIA), and cerebral infarction  without residual deficits 02/27/2022   Pure hypertriglyceridemia 02/27/2022   Restless legs syndrome 02/27/2022   Seizures ([place])    Shortness of breath 02/27/2022   Subdural hematoma ([place])    a. 12/12. Hospitalized at [facility], requiring craniotomy, complicated by seizures.   Type 2 diabetes mellitus without complications ([place]) [id number]   Vitamin D deficiency    Vitamin D deficiency 02/27/2022    Past Surgical History:  Procedure Laterality Date   BRAIN HEMATOMA EVACUATION     CARDIAC CATHETERIZATION     no significant CAD   CARDIAC DEFIBRILLATOR PLACEMENT  2006   CHOLECYSTECTOMY     IMPLANTABLE CARDIOVERTER DEFIBRILLATOR (ICD) GENERATOR CHANGE N/A 03/01/2011   Procedure: ICD GENERATOR CHANGE;  Surgeon: Deboraha Sprang, MD;  Location: [facility] CATH LAB;  Service: Cardiovascular;  Laterality: N/A;   TOTAL ABDOMINAL HYSTERECTOMY  1983    Current Medications: Current Meds  Medication Sig   albuterol (VENTOLIN HFA) 108 (90 Base) MCG/ACT inhaler Inhale 1-2 puffs into the lungs as needed for wheezing or shortness of breath.   alendronate (FOSAMAX) 70 MG tablet Take 70 mg by mouth once a week. Take on  Saturday   atorvastatin (LIPITOR) 40 MG tablet Take 40 mg by mouth daily at 6 PM.    BREZTRI AEROSPHERE 160-9-4.8 MCG/ACT AERO Inhale 2 puffs into the lungs 2 (two) times daily.   ergocalciferol (VITAMIN D2) 1.25 MG (50000 UT) capsule Take 1 capsule by mouth once a week. Take on Saturday   furosemide (  LASIX) 40 MG tablet Take 20 mg by mouth daily.   lamoTRIgine (LAMICTAL) 25 MG tablet Take 2 tablets in AM, 2 tablets in PM   levETIRAcetam (KEPPRA) 1000 MG tablet Take 1.5 tablets in AM, 1.5 tablets in PM   LORazepam (ATIVAN) 1 MG tablet Take 1 tablet as needed for seizure. Do not take more than 3 a week.   midodrine (PROAMATINE) 5 MG tablet Take 5 mg by mouth 2 (two) times daily.   nitroGLYCERIN (NITROSTAT) 0.4 MG SL tablet Place 0.4 mg under the tongue every 5 (five) minutes as needed for chest  pain (MAX 3 TABLETS).   omeprazole (PRILOSEC) 40 MG capsule Take 1 capsule by mouth 2 (two) times daily.   OXYGEN Inhale 2 L into the lungs daily.   potassium chloride (KLOR-CON M) 10 MEQ tablet Take 10 mEq by mouth 2 (two) times daily.   sacubitril-valsartan (ENTRESTO) 24-26 MG Take 1 tablet by mouth 2 (two) times daily.   sitaGLIPtan (JANUVIA) 100 MG tablet Take 100 mg by mouth daily.   VASCEPA 1 g capsule Take 2 g by mouth 2 (two) times daily.   vitamin B-12 (CYANOCOBALAMIN) 100 MCG tablet Take 50 mcg by mouth daily.     Allergies:   Codeine, Gemfibrozil, Lactose intolerance (gi), Lactose, Morphine and related, and Valium   Social History   Socioeconomic History   Marital status: Married    Spouse name: Not on file   Number of children: 5   Years of education: Not on file   Highest education level: Not on file  Occupational History   Occupation: retired  Tobacco Use   Smoking status: Former    Types: Cigarettes    Quit date: 01/17/1968    Years since quitting: 54.2   Smokeless tobacco: Never  Vaping Use   Vaping Use: Never used  Substance and Sexual Activity   Alcohol use: No    Alcohol/week: 0.0 standard drinks of alcohol   Drug use: No   Sexual activity: Not Currently    Partners: Male  Other Topics Concern   Not on file  Social History Narrative   Registration notes - ICD = [employer]      Lives with husband and oldest son and daughter in law      One story home      Right handed      Highest Level of EDU- 10th grade   Social Determinants of Health   Financial Resource Strain: Not on file  Food Insecurity: Not on file  Transportation Needs: Not on file  Physical Activity: Not on file  Stress: Not on file  Social Connections: Not on file     Family History: The patient's family history includes Heart disease in her father, mother, and another family member.  ROS:   Please see the history of present illness.    All other systems reviewed and are  negative.  EKGs/Labs/Other Studies Reviewed:    The following studies were reviewed today: EKG reveals sinus rhythm with intraventricular conduction delay.   Recent Labs: No results found for requested labs within last 365 days.  Recent Lipid Panel No results found for: "CHOL", "TRIG", "HDL", "CHOLHDL", "VLDL", "LDLCALC", "LDLDIRECT"  Physical Exam:    VS:  BP (!) 144/80   Pulse 82   Ht '5\' 4"'$  (1.626 m)   Wt 153 lb 3.2 oz (69.5 kg)   SpO2 98%   BMI 26.30 kg/m     Wt Readings from Last 3 Encounters:  03/22/22 153 lb 3.2 oz (69.5 kg)  04/08/21 141 lb (64 kg)  06/01/20 145 lb (65.8 kg)     GEN: Patient is in no acute distress HEENT: Normal NECK: No JVD; No carotid bruits LYMPHATICS: No lymphadenopathy CARDIAC: Hear sounds regular, 2/6 systolic murmur at the apex. RESPIRATORY:  Clear to auscultation without rales, wheezing or rhonchi.  Air sounds are distant. ABDOMEN: Soft, non-tender, non-distended MUSCULOSKELETAL:  No edema; No deformity  SKIN: Warm and dry NEUROLOGIC:  Alert and oriented x 3 PSYCHIATRIC:  Normal affect   Signed, Jenean Lindau, MD  03/22/2022 2:01 PM    [place]

## 2022-03-28 ENCOUNTER — Ambulatory Visit: Payer: Medicare HMO | Admitting: Neurology

## 2022-03-28 ENCOUNTER — Encounter: Payer: Self-pay | Admitting: Neurology

## 2022-03-28 VITALS — BP 123/70 | HR 77 | Ht 64.0 in | Wt 151.0 lb

## 2022-03-28 DIAGNOSIS — G40109 Localization-related (focal) (partial) symptomatic epilepsy and epileptic syndromes with simple partial seizures, not intractable, without status epilepticus: Secondary | ICD-10-CM

## 2022-03-28 MED ORDER — LAMOTRIGINE 25 MG PO TABS: ORAL_TABLET | ORAL | 3 refills | Status: AC

## 2022-03-28 MED ORDER — LEVETIRACETAM 1000 MG PO TABS: ORAL_TABLET | ORAL | 3 refills | Status: AC

## 2022-03-28 NOTE — Patient Instructions (Signed)
Good to see you. Continue Levetiracetam '1000mg'$ : take 1 tablet in AM, 1 and 1/2 tablets in PM, continue Lamotrigine '25mg'$ : take 2 tablets twice a day.  Follow-up in 1 year, call for any changes   Seizure Precautions: 1. If medication has been prescribed for you to prevent seizures, take it exactly as directed.  Do not stop taking the medicine without talking to your doctor first, even if you have not had a seizure in a long time.   2. Avoid activities in which a seizure would cause danger to yourself or to others.  Don't operate dangerous machinery, swim alone, or climb in high or dangerous places, such as on ladders, roofs, or girders.  Do not drive unless your doctor says you may.  3. If you have any warning that you may have a seizure, lay down in a safe place where you can't hurt yourself.    4.  No driving for 6 months from last seizure, as per [facility].   Please refer to the following link on the [facility] website for more information: http://[email or web address]/[email or web address]/drivingu.cfm   5.  Maintain good sleep hygiene.  6.  Contact your doctor if you have any problems that may be related to the medicine you are taking.  7.  Call 911 and bring the patient back to the ED if:        A.  The seizure lasts longer than 5 minutes.       B.  The patient doesn't awaken shortly after the seizure  C.  The patient has new problems such as difficulty seeing, speaking or moving  D.  The patient was injured during the seizure  E.  The patient has a temperature over 102 F (39C)  F.  The patient vomited and now is having trouble breathing

## 2022-03-28 NOTE — Progress Notes (Signed)
NEUROLOGY FOLLOW UP OFFICE NOTE  [name] BG:[id number] October 29, 1940  HISTORY OF PRESENT ILLNESS: I had the pleasure of seeing [name] in follow-up in the neurology clinic on 03/28/2022.  The patient was last seen a year ago for seizures. She is accompanied by her daughter-in-law [name] who helps supplement the history today.  Records and images were personally reviewed where available. Since her last visit, she had called our office in 11/2021 that [place] level checked by PCP was elevated. Bloodwork was done because she reported feeling weak with a headache. On 11/25/21, Keppra level was 206. Hgb was 10.5, Hct 31.4, BUN 24,  creatinine 1.97. Repeat trough level done 12/06/21 was 174. Since she had been seizure-free, Levetiracetam dose reduced to '1000mg'$  in AM, '1500mg'$  in PM. She is also on Lamotrigine '50mg'$  BID. They continue to deny any seizures, no staring/unresponsive episodes, left-sided twitching, focal weakness. Once in a while the right side of her head and face seems to throb, there is no pain, it "just feels funny." It does not affect speech or comprehension, this is the first time [name] has heard of it and has not witnessed any facial twitching. It usually happens when she goes to bed at night, last episode was this week. She monitors her blood pressure closely, she is on midodrine for hypotension and notes BP has been better recently. She denies any further weakness. She denies any falls, but has caught herself a few times. She ambulates with a cane at home. Her husband passed away in Jun 04, 2022, she lives with her son and daughter-in-law.   History on Initial Assessment 07/28/2014: This is a 82 yo RH woman with a history of cardiomyopathy s/p ICD placement, hypertension, hyperlipidemia, diabetes, paroxysmal atrial fibrillation, who had a fall in December 2012, found to have multiple right extraaxial intracranial hemorrhages and INR >20 while on Coumadin at that time. She had a witnessed seizure in  [place] then transferred to [facility] where she had a right craniotomy of evacuation of epidural hematoma. She had another seizure after the surgery. She was discharged home on Keppra. She had an admission in 04-Jun-2011 for recurrent seizures, there is note that she was being weaned off seizure medication, then had a seizure and Keppra was restarted. She was reported to have twitching. I personally reviewed head CT done at that time which showed right frontal craniotomy, no residual fluid collection. Remote cortical infarcts/contusions in the right and left occipital regions. She denies any further seizures until 05/24/14 when she started having uncontrollable twitching on the left side of her face and left arm per ER notes. She tells me today that her whole body was shaking. She was awake and alert, able to communicate, denies confusion. She denied focal weakness after. She was given IV Ativan with resolution of twitching. She had a head CT (images unavailable for review) which showed post-surgical right frontal craniotomy changes, chronic right PCA infarct, no acute abnormalities. She had an EEG reported as normal awake and drowsy EEG. She saw her PCP and had an elevated Keppra level on Keppra '750mg'$  BID, dose was reduced to '500mg'$  BID. She denies any further episodes of left-sided twitching. She has a prescription for prn Ativan.   Epilepsy Risk Factors:  Right-sided intracranial hemorrhage in 2012 s/p craniotomy. Otherwise she  had a normal birth and early development.  There is no history of febrile convulsions, CNS infections such as meningitis/encephalitis, or family history of seizures.  PAST MEDICAL HISTORY: Past Medical History:  Diagnosis Date   Abdominal aortic aneurysm without rupture ([place]) 02/27/2022   Abdominal aortic ectasia (HCC) 02/27/2022   Acute on chronic systolic (congestive) heart failure (HCC)    Age-related osteoporosis without current pathological fracture 02/27/2022    Anemia 01/03/2011   Angina pectoris ([place]) 02/27/2022   Anxiety disorder 02/27/2022   Atherosclerosis of native arteries of extremities with intermittent claudication, unspecified extremity ([place]) 02/27/2022   Atherosclerosis of native arteries of extremities with rest pain, unspecified extremity ([place]) 02/27/2022   Atrial fibrillation ([place]) 12/23/2010   Biventricular ICD (implantable cardiac defibrillator) in place    a.  s/p revision 2/2 ERI 02/28/2011 - [facility] CD 3257   Biventricular implantable cardioverter-defibrillator in situ    a.  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/27/2022   Chronic coronary artery disease 12/23/2010   Chronic kidney disease 02/27/2022   Chronic kidney disease, stage 3b ([place]) 02/27/2022   Chronic obstructive pulmonary disease ([place]) 0000000   Chronic systolic heart failure ([place])        Dementia ([place]) 02/27/2022   Diarrhea 01/03/2011   DM2 (diabetes mellitus, type 2) ([place])    Encounter for therapeutic drug level monitoring 02/27/2022   Epilepsy ([place]) 02/27/2022   Essential hypertension 12/23/2010   Fatigue 06/11/2012   Gastritis 01/03/2011   Gastro-esophageal reflux disease with esophagitis 02/27/2022   Gastro-esophageal reflux disease without esophagitis 02/27/2022   Hardening of the aorta (main artery of the heart) ([place]) 02/27/2022   History of subdural hematoma 07/28/2014   HLD (hyperlipidemia)    Hypercholesterolemia 12/23/2010   Hyperglycemia due to type 2 diabetes mellitus ([place]) 02/27/2022   Hypertension    Hypokalemia    Hypomagnesemia 01/03/2011   Hypothyroidism 02/27/2022   ICD (implantable cardioverter-defibrillator) malfunction, initial encounter 03/22/2016   Idiopathic progressive polyneuropathy 02/27/2022   Insomnia 02/27/2022   LBBB (left bundle branch block)    Localization-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 Initial EF of 35%. b. Normal in 07/2009. c. 35% in 2012, 30% in 12/2011. Reported h/o catheterization done presumably before her ICD implanted at [facility] demonstrated some years ago no obstructive coronary disease. Neg nuc 12/2011.   Non-toxic multinodular goiter 02/27/2022   Nonspecific low blood pressure reading    Occlusion and stenosis of bilateral carotid arteries 02/27/2022   Other long term (current) drug therapy 02/27/2022   Other vitamin B12 deficiency anemias 02/27/2022   PAF (paroxysmal atrial fibrillation) ([place])    a. Not on anticoag due to h/o SDH.   Peripheral vascular disease ([place]) 02/27/2022   Personal history of transient ischemic attack (TIA), and cerebral infarction without residual deficits 02/27/2022   Pure hypertriglyceridemia 02/27/2022   Restless legs syndrome 02/27/2022   Seizures ([place])    Shortness of breath 02/27/2022   Subdural hematoma ([place])    a. 12/12. Hospitalized at [facility], requiring craniotomy, complicated by seizures.   Type 2 diabetes mellitus without complications ([place]) [id number]   Vitamin D deficiency    Vitamin D deficiency 02/27/2022    MEDICATIONS: Current Outpatient Medications on File Prior to Visit  Medication Sig Dispense Refill   albuterol (VENTOLIN HFA) 108 (90 Base) MCG/ACT inhaler Inhale 1-2 puffs into the lungs as needed for wheezing or shortness of breath.     alendronate (  FOSAMAX) 70 MG tablet Take 70 mg by mouth once a week. Take on  Saturday     atorvastatin (LIPITOR) 40 MG tablet Take 40 mg by mouth daily at 6 PM.      BREZTRI AEROSPHERE 160-9-4.8 MCG/ACT AERO Inhale 2 puffs into the lungs 2 (two) times daily.     ergocalciferol (VITAMIN D2) 1.25 MG (50000 UT) capsule Take 1 capsule by mouth once a week. Take on Saturday     furosemide (LASIX) 40 MG tablet Take  20 mg by mouth daily.     lamoTRIgine (LAMICTAL) 25 MG tablet Take 2 tablets in AM, 2 tablets in PM 360 tablet 3   levETIRAcetam (KEPPRA) 1000 MG tablet Take 1.5 tablets in AM, 1.5 tablets in PM (Patient taking differently: Take 1 tablets in AM, 1.5 tablets in PM) 270 tablet 3   levothyroxine (SYNTHROID) 25 MCG tablet Take 25 mcg by mouth daily.     LORazepam (ATIVAN) 1 MG tablet Take 1 tablet as needed for seizure. Do not take more than 3 a week. 10 tablet 5   midodrine (PROAMATINE) 5 MG tablet Take 5 mg by mouth 2 (two) times daily.     nitroGLYCERIN (NITROSTAT) 0.4 MG SL tablet Place 0.4 mg under the tongue every 5 (five) minutes as needed for chest pain (MAX 3 TABLETS).     omeprazole (PRILOSEC) 40 MG capsule Take 1 capsule by mouth 2 (two) times daily.     OXYGEN Inhale 2 L into the lungs daily.     potassium chloride (KLOR-CON M) 10 MEQ tablet Take 10 mEq by mouth 2 (two) times daily.     sacubitril-valsartan (ENTRESTO) 24-26 MG Take 1 tablet by mouth 2 (two) times daily.     sitaGLIPtan (JANUVIA) 100 MG tablet Take 100 mg by mouth daily.     VASCEPA 1 g capsule Take 2 g by mouth 2 (two) times daily.     vitamin B-12 (CYANOCOBALAMIN) 100 MCG tablet Take 50 mcg by mouth daily.     No current facility-administered medications on file prior to visit.    ALLERGIES: Allergies  Allergen Reactions   Codeine Other (See Comments)    "puts me on a trip"   Gemfibrozil Diarrhea   Lactose Intolerance (Gi) Diarrhea   Lactose Other (See Comments)   Morphine And Related Other (See Comments)    unknown   Valium Hives and Rash    FAMILY HISTORY: Family History  Problem Relation Age of Onset   Heart disease Mother    Heart disease Father    Heart disease Other     SOCIAL HISTORY: Social History   Socioeconomic History   Marital status: Married    Spouse name: Not on file   Number of children: 5   Years of education: Not on file   Highest education level: Not on file  Occupational  History   Occupation: retired  Tobacco Use   Smoking status: Former    Types: Cigarettes    Quit date: 01/17/1968    Years since quitting: 54.2   Smokeless tobacco: Never  Vaping Use   Vaping Use: Never used  Substance and Sexual Activity   Alcohol use: No    Alcohol/week: 0.0 standard drinks of alcohol   Drug use: No   Sexual activity: Not Currently    Partners: Male  Other Topics Concern   Not on file  Social History Narrative   Registration notes - ICD = [employer]  Lives with husband and oldest son and daughter in law      One story home      Right handed      Highest Level of EDU- 10th grade   Social Determinants of Health   Financial Resource Strain: Not on file  Food Insecurity: Not on file  Transportation Needs: Not on file  Physical Activity: Not on file  Stress: Not on file  Social Connections: Not on file  Intimate Partner Violence: Not on file     PHYSICAL EXAM: Vitals:   03/28/22 0946  BP: 123/70  [name]: 77  SpO2: 99%   General: No acute distress, sitting on wheelchair with O2 via nasal cannula Head:  Normocephalic/atraumatic Skin/Extremities: No rash, no edema Neurological Exam: alert and awake. No aphasia or dysarthria. Fund of knowledge is appropriate.  Attention and concentration are normal.   Cranial nerves: Pupils equal, round. Extraocular movements intact with no nystagmus. Visual fields full.  No facial asymmetry.  Motor: Bulk and tone normal, muscle strength 5/5 throughout with no pronator drift.   Finger to nose testing intact.  Gait not tested.   IMPRESSION: This is an 83 yo RH woman with a history of hypertension, hyperlipidemia, diabetes, cardiomyopathy s/p ICD placement, paroxysmal atrial fibrillation off anticoagulation due to intracranial bleed in 2012 with subsequent seizures with left-sided twitching. She denies any seizures in 2 years, continue Levetiracetam '1000mg'$  in AM, '1500mg'$  in PM and Lamotrigine '50mg'$  BID. Continue BP  follow-up with PCP and Cardiology, she closely monitors BP/HR at home. She does not drive. Follow-up in 1 year, call for any changes.    Thank you for allowing me to participate in her care.  Please do not hesitate to call for any questions or concerns.    [name], M.D.   CC: Dr. London Pepper

## 2022-04-04 ENCOUNTER — Ambulatory Visit: Payer: Medicare HMO | Attending: Cardiology

## 2022-04-04 ENCOUNTER — Ambulatory Visit (INDEPENDENT_AMBULATORY_CARE_PROVIDER_SITE_OTHER): Payer: Medicare HMO

## 2022-04-04 DIAGNOSIS — I5023 Acute on chronic systolic (congestive) heart failure: Secondary | ICD-10-CM | POA: Diagnosis not present

## 2022-04-04 DIAGNOSIS — I714 Abdominal aortic aneurysm, without rupture, unspecified: Secondary | ICD-10-CM | POA: Diagnosis not present

## 2022-04-04 LAB — ECHOCARDIOGRAM COMPLETE
Calc EF: 25.8 %
S' Lateral: 2.9 cm
Single Plane A2C EF: 26.1 %
Single Plane A4C EF: 22.4 %

## 2022-04-04 MED ORDER — PERFLUTREN LIPID MICROSPHERE
1.0000 mL | INTRAVENOUS | Status: AC | PRN
  Administered 2022-04-04: 6 mL via INTRAVENOUS

## 2022-04-05 DIAGNOSIS — G40909 Epilepsy, unspecified, not intractable, without status epilepticus: Secondary | ICD-10-CM | POA: Diagnosis not present

## 2022-04-05 DIAGNOSIS — J449 Chronic obstructive pulmonary disease, unspecified: Secondary | ICD-10-CM | POA: Diagnosis not present

## 2022-04-05 DIAGNOSIS — Z0000 Encounter for general adult medical examination without abnormal findings: Secondary | ICD-10-CM | POA: Diagnosis not present

## 2022-04-05 DIAGNOSIS — I10 Essential (primary) hypertension: Secondary | ICD-10-CM | POA: Diagnosis not present

## 2022-04-05 DIAGNOSIS — N183 Chronic kidney disease, stage 3 unspecified: Secondary | ICD-10-CM | POA: Diagnosis not present

## 2022-04-05 DIAGNOSIS — E559 Vitamin D deficiency, unspecified: Secondary | ICD-10-CM | POA: Diagnosis not present

## 2022-04-05 DIAGNOSIS — E1165 Type 2 diabetes mellitus with hyperglycemia: Secondary | ICD-10-CM | POA: Diagnosis not present

## 2022-04-05 DIAGNOSIS — M810 Age-related osteoporosis without current pathological fracture: Secondary | ICD-10-CM | POA: Diagnosis not present

## 2022-04-05 DIAGNOSIS — E782 Mixed hyperlipidemia: Secondary | ICD-10-CM | POA: Diagnosis not present

## 2022-04-05 DIAGNOSIS — I5023 Acute on chronic systolic (congestive) heart failure: Secondary | ICD-10-CM | POA: Diagnosis not present

## 2022-04-05 DIAGNOSIS — I209 Angina pectoris, unspecified: Secondary | ICD-10-CM | POA: Diagnosis not present

## 2022-04-05 DIAGNOSIS — R569 Unspecified convulsions: Secondary | ICD-10-CM | POA: Diagnosis not present

## 2022-04-05 DIAGNOSIS — F0390 Unspecified dementia without behavioral disturbance: Secondary | ICD-10-CM | POA: Diagnosis not present

## 2022-04-05 DIAGNOSIS — D518 Other vitamin B12 deficiency anemias: Secondary | ICD-10-CM | POA: Diagnosis not present

## 2022-04-14 DIAGNOSIS — I10 Essential (primary) hypertension: Secondary | ICD-10-CM | POA: Diagnosis not present

## 2022-04-25 ENCOUNTER — Ambulatory Visit: Payer: Medicare HMO | Admitting: Cardiology

## 2022-04-25 ENCOUNTER — Encounter: Payer: Self-pay | Admitting: Cardiology

## 2022-04-25 ENCOUNTER — Ambulatory Visit: Payer: Medicare HMO | Attending: Cardiology | Admitting: Cardiology

## 2022-04-25 VITALS — BP 146/96 | HR 90 | Ht 64.0 in | Wt 157.4 lb

## 2022-04-25 DIAGNOSIS — I10 Essential (primary) hypertension: Secondary | ICD-10-CM | POA: Diagnosis not present

## 2022-04-25 DIAGNOSIS — T82118A Breakdown (mechanical) of other cardiac electronic device, initial encounter: Secondary | ICD-10-CM

## 2022-04-25 DIAGNOSIS — I447 Left bundle-branch block, unspecified: Secondary | ICD-10-CM | POA: Diagnosis not present

## 2022-04-25 DIAGNOSIS — I428 Other cardiomyopathies: Secondary | ICD-10-CM | POA: Diagnosis not present

## 2022-04-25 DIAGNOSIS — T82118D Breakdown (mechanical) of other cardiac electronic device, subsequent encounter: Secondary | ICD-10-CM | POA: Diagnosis not present

## 2022-04-25 NOTE — Patient Instructions (Signed)
Medication Instructions:  Your physician recommends that you continue on your current medications as directed. Please refer to the Current Medication list given to you today.  *If you need a refill on your cardiac medications before your next appointment, please call your pharmacy*   Lab Work: None ordered If you have labs (blood work) drawn today and your tests are completely normal, you will receive your results only by: MyChart Message (if you have MyChart) OR A paper copy in the mail If you have any lab test that is abnormal or we need to change your treatment, we will call you to review the results.   Testing/Procedures: None ordered   Follow-Up: At [facility], you and your health needs are our priority.  As part of our continuing mission to provide you with exceptional heart care, we have created designated Provider Care Teams.  These Care Teams include your primary Cardiologist (physician) and Advanced Practice Providers (APPs -  Physician Assistants and Nurse Practitioners) who all work together to provide you with the care you need, when you need it.  We recommend signing up for the patient portal called "MyChart".  Sign up information is provided on this After Visit Summary.  MyChart is used to connect with patients for Virtual Visits (Telemedicine).  Patients are able to view lab/test results, encounter notes, upcoming appointments, etc.  Non-urgent messages can be sent to your provider as well.   To learn more about what you can do with MyChart, go to [email or web address].    Your next appointment:   9 month(s)  The format for your next appointment:   Virtual Visit   Provider:   Belva Crome, MD    Other Instructions none  Important Information About Sugar

## 2022-04-25 NOTE — Progress Notes (Signed)
Cardiology Office Note:    Date:  04/25/2022   ID:  [name], DOB 13-Apr-1940, MRN [id number]  PCP:  Galvin ProfferHague, Imran P, MD  Cardiologist:  Garwin Brothersajan R Pearline Yerby, MD   Referring MD: Galvin ProfferHague, Imran P, MD    ASSESSMENT:    1. Primary hypertension   2. LBBB (left bundle branch block)   3. NICM (nonischemic cardiomyopathy)   4. ICD (implantable cardioverter-defibrillator) malfunction, initial encounter    PLAN:    In order of problems listed above:  Primary prevention stressed with the patient.  Importance of compliance with diet medication stressed and she vocalized understanding. Advanced cardiomyopathy: Stable at this time.  She understands salt intake issues and daily weights and she checks this on a regular basis.  She is very meticulous about it.  I reviewed her records that she brought from home. Intracardiac defibrillator: Followed by our electrophysiology colleagues. Paroxysmal atrial fibrillation: Not on anticoagulation because of history of subdural hematoma and gait being unsteady. Essential hypertension: Blood pressure stable and diet was emphasized. Mixed dyslipidemia: On lipid-lowering medications followed by primary care Renal insufficiency: She mentions to me that she sees a nephrologist on a regular basis. Patient will be seen in follow-up appointment in 6 months or earlier if the patient has any concerns.    Medication Adjustments/Labs and Tests Ordered: Current medicines are reviewed at length with the patient today.  Concerns regarding medicines are outlined above.  No orders of the defined types were placed in this encounter.  No orders of the defined types were placed in this encounter.    No chief complaint on file.    History of Present Illness:    [name] is a 82 y.o. female with this patient has been under my care in my previous practice.  He is here now to transfer his care and be established with my current practice.  Patient has past medical history  of advanced cardiomyopathy, essential hypertension, mixed dyslipidemia, COPD on supplemental oxygen and history of paroxysmal atrial fibrillation not on anticoagulation because of history of subdural hematoma.  Her gait appears to be unsteady.  She denies any problems at this time and takes care of activities of daily living.  No chest pain orthopnea or PND.  At the time of my evaluation, the patient is alert awake oriented and in no distress.  Past Medical History:  Diagnosis Date   Abdominal aortic aneurysm without rupture 02/27/2022   Abdominal aortic ectasia 02/27/2022   Acute on chronic systolic (congestive) heart failure    Age-related osteoporosis without current pathological fracture 02/27/2022   Anemia 01/03/2011   Angina pectoris 02/27/2022   Anxiety disorder 02/27/2022   Atherosclerosis of native arteries of extremities with intermittent claudication, unspecified extremity 02/27/2022   Atherosclerosis of native arteries of extremities with rest pain, unspecified extremity 02/27/2022   Atrial fibrillation 12/23/2010   Biventricular ICD (implantable cardiac defibrillator) in place    a.  s/p revision 2/2 ERI 02/28/2011 - [facility] CD 3257   Biventricular implantable cardioverter-defibrillator in situ    a.  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/27/2022   Chronic coronary artery disease 12/23/2010   Chronic kidney disease 02/27/2022   Chronic kidney disease, stage 3b 02/27/2022   Chronic obstructive pulmonary disease 12/23/2010   Chronic systolic heart failure        Dementia 02/27/2022   Diarrhea 01/03/2011   DM2 (diabetes mellitus, type 2)    Encounter for therapeutic  drug level monitoring 02/27/2022   Epilepsy 02/27/2022   Essential hypertension 12/23/2010   Fatigue 06/11/2012   Gastritis 01/03/2011   Gastro-esophageal reflux disease with esophagitis 02/27/2022   Gastro-esophageal reflux disease without esophagitis 02/27/2022    Hardening of the aorta (main artery of the heart) 02/27/2022   History of subdural hematoma 07/28/2014   HLD (hyperlipidemia)    Hypercholesterolemia 12/23/2010   Hyperglycemia due to type 2 diabetes mellitus 02/27/2022   Hypertension    Hypokalemia    Hypomagnesemia 01/03/2011   Hypothyroidism 02/27/2022   ICD (implantable cardioverter-defibrillator) malfunction, initial encounter 03/22/2016   Idiopathic progressive polyneuropathy 02/27/2022   Insomnia 02/27/2022   LBBB (left bundle branch block)    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 Initial EF of 35%. b. Normal in 07/2009. c. 35% in 2012, 30% in 12/2011. Reported h/o catheterization done presumably before her ICD implanted at [facility] demonstrated some years ago no obstructive coronary disease. Neg nuc 12/2011.   Non-toxic multinodular goiter 02/27/2022   Nonspecific low blood pressure reading    Occlusion and stenosis of bilateral carotid arteries 02/27/2022   Other long term (current) drug therapy 02/27/2022   Other vitamin B12 deficiency anemias 02/27/2022   PAF (paroxysmal atrial fibrillation)    a. Not on anticoag due to h/o SDH.   Peripheral vascular disease 02/27/2022   Personal history of transient ischemic attack (TIA), and cerebral infarction without residual deficits 02/27/2022   Pure hypertriglyceridemia 02/27/2022   Restless legs syndrome 02/27/2022   Seizures    Shortness of breath 02/27/2022   Subdural hematoma    a. 12/12. Hospitalized at [facility], requiring craniotomy, complicated by seizures.   Type 2 diabetes mellitus without complications 02/27/2022   Vitamin D deficiency 02/27/2022    Past Surgical History:  Procedure Laterality Date   BRAIN HEMATOMA EVACUATION      CARDIAC CATHETERIZATION     no significant CAD   CARDIAC DEFIBRILLATOR PLACEMENT  2006   CHOLECYSTECTOMY     IMPLANTABLE CARDIOVERTER DEFIBRILLATOR (ICD) GENERATOR CHANGE N/A 03/01/2011   Procedure: ICD GENERATOR CHANGE;  Surgeon: Duke Salvia, MD;  Location: [facility] CATH LAB;  Service: Cardiovascular;  Laterality: N/A;   TOTAL ABDOMINAL HYSTERECTOMY  1983    Current Medications: Current Meds  Medication Sig   albuterol (VENTOLIN HFA) 108 (90 Base) MCG/ACT inhaler Inhale 1-2 puffs into the lungs as needed for wheezing or shortness of breath.   alendronate (FOSAMAX) 70 MG tablet Take 70 mg by mouth once a week. Take on  Saturday   atorvastatin (LIPITOR) 40 MG tablet Take 40 mg by mouth daily at 6 PM.    BREZTRI AEROSPHERE 160-9-4.8 MCG/ACT AERO Inhale 2 puffs into the lungs 2 (two) times daily.   ergocalciferol (VITAMIN D2) 1.25 MG (50000 UT) capsule Take 1 capsule by mouth once a week. Take on Saturday   furosemide (LASIX) 40 MG tablet Take 20 mg by mouth daily.   lamoTRIgine (LAMICTAL) 25 MG tablet Take 2 tablets in AM, 2 tablets in PM   levETIRAcetam (KEPPRA) 1000 MG tablet Take 1 tablet in AM, 1 and 1/2 tablets in PM   levothyroxine (SYNTHROID) 25 MCG tablet Take 25 mcg by mouth daily.   LORazepam (ATIVAN) 1 MG tablet Take 1 tablet as needed for seizure. Do not take more than  3 a week.   midodrine (PROAMATINE) 5 MG tablet Take 5 mg by mouth 2 (two) times daily.   nitroGLYCERIN (NITROSTAT) 0.4 MG SL tablet Place 0.4 mg under the tongue every 5 (five) minutes as needed for chest pain (MAX 3 TABLETS).   omeprazole (PRILOSEC) 40 MG capsule Take 1 capsule by mouth 2 (two) times daily.   OXYGEN Inhale 2 L into the lungs daily.   potassium chloride (KLOR-CON M) 10 MEQ tablet Take 10 mEq by mouth 2 (two) times daily.   sacubitril-valsartan (ENTRESTO) 24-26 MG Take 1 tablet by mouth 2 (two) times daily.   sitaGLIPtan (JANUVIA) 100 MG tablet Take 100 mg by mouth daily.   VASCEPA 1 g capsule Take  2 g by mouth 2 (two) times daily.   vitamin B-12 (CYANOCOBALAMIN) 100 MCG tablet Take 50 mcg by mouth daily.     Allergies:   Codeine, Gemfibrozil, Lactose intolerance (gi), Lactose, Morphine and related, and Valium   Social History   Socioeconomic History   Marital status: Married    Spouse name: Not on file   Number of children: 5   Years of education: Not on file   Highest education level: Not on file  Occupational History   Occupation: retired  Tobacco Use   Smoking status: Former    Types: Cigarettes    Quit date: 01/17/1968    Years since quitting: 54.3   Smokeless tobacco: Never  Vaping Use   Vaping Use: Never used  Substance and Sexual Activity   Alcohol use: No    Alcohol/week: 0.0 standard drinks of alcohol   Drug use: No   Sexual activity: Not Currently    Partners: Male  Other Topics Concern   Not on file  Social History Narrative   Registration notes - ICD = St. Jude      Lives with husband and oldest son and daughter in law      One story home      Right handed      Highest Level of EDU- 10th grade   Social Determinants of Health   Financial Resource Strain: Not on file  Food Insecurity: Not on file  Transportation Needs: Not on file  Physical Activity: Not on file  Stress: Not on file  Social Connections: Not on file     Family History: The patient's family history includes Heart disease in her father, mother, and another family member.  ROS:   Please see the history of present illness.    All other systems reviewed and are negative.  EKGs/Labs/Other Studies Reviewed:    The following studies were reviewed today: I discussed my findings with the patient at length and echo report was discussed.   Recent Labs: No results found for requested labs within last 365 days.  Recent Lipid Panel No results found for: "CHOL", "TRIG", "HDL", "CHOLHDL", "VLDL", "LDLCALC", "LDLDIRECT"  Physical Exam:    VS:  BP (!) 146/96   Pulse 90   Ht   (1.626 m)   Wt 157 lb 6.4 oz (71.4 kg)   SpO2 99%   BMI 27.02 kg/m     Wt Readings from Last 3 Encounters:  04/25/22 157 lb 6.4 oz (71.4 kg)  03/28/22 151 lb (68.5 kg)  03/22/22 153 lb 3.2 oz (69.5 kg)     GEN: Patient is in no acute distress HEENT: Normal NECK: No JVD; No carotid bruits LYMPHATICS: No lymphadenopathy CARDIAC: Hear sounds regular, 2/6 systolic murmur at the apex. RESPIRATORY:  Clear to auscultation without rales, wheezing or rhonchi  ABDOMEN: Soft, non-tender, non-distended MUSCULOSKELETAL:  No edema; No deformity  SKIN: Warm and dry NEUROLOGIC:  Alert and oriented x 3 PSYCHIATRIC:  Normal affect   Signed, Garwin Brothers, MD  04/25/2022 12:56 PM    [facility]

## 2022-04-27 DIAGNOSIS — I739 Peripheral vascular disease, unspecified: Secondary | ICD-10-CM | POA: Diagnosis not present

## 2022-04-27 DIAGNOSIS — E038 Other specified hypothyroidism: Secondary | ICD-10-CM | POA: Diagnosis not present

## 2022-04-27 DIAGNOSIS — I10 Essential (primary) hypertension: Secondary | ICD-10-CM | POA: Diagnosis not present

## 2022-04-27 DIAGNOSIS — F418 Other specified anxiety disorders: Secondary | ICD-10-CM | POA: Diagnosis not present

## 2022-04-27 DIAGNOSIS — I5023 Acute on chronic systolic (congestive) heart failure: Secondary | ICD-10-CM | POA: Diagnosis not present

## 2022-04-27 DIAGNOSIS — E1165 Type 2 diabetes mellitus with hyperglycemia: Secondary | ICD-10-CM | POA: Diagnosis not present

## 2022-04-27 DIAGNOSIS — M810 Age-related osteoporosis without current pathological fracture: Secondary | ICD-10-CM | POA: Diagnosis not present

## 2022-04-27 DIAGNOSIS — J449 Chronic obstructive pulmonary disease, unspecified: Secondary | ICD-10-CM | POA: Diagnosis not present

## 2022-04-27 DIAGNOSIS — E782 Mixed hyperlipidemia: Secondary | ICD-10-CM | POA: Diagnosis not present

## 2022-05-08 DIAGNOSIS — F419 Anxiety disorder, unspecified: Secondary | ICD-10-CM | POA: Diagnosis not present

## 2022-05-15 DIAGNOSIS — I10 Essential (primary) hypertension: Secondary | ICD-10-CM | POA: Diagnosis not present

## 2022-06-06 DIAGNOSIS — R809 Proteinuria, unspecified: Secondary | ICD-10-CM | POA: Diagnosis not present

## 2022-06-06 DIAGNOSIS — Z79899 Other long term (current) drug therapy: Secondary | ICD-10-CM | POA: Diagnosis not present

## 2022-06-06 DIAGNOSIS — E1165 Type 2 diabetes mellitus with hyperglycemia: Secondary | ICD-10-CM | POA: Diagnosis not present

## 2022-06-06 DIAGNOSIS — E038 Other specified hypothyroidism: Secondary | ICD-10-CM | POA: Diagnosis not present

## 2022-06-06 DIAGNOSIS — E782 Mixed hyperlipidemia: Secondary | ICD-10-CM | POA: Diagnosis not present

## 2022-06-06 DIAGNOSIS — D518 Other vitamin B12 deficiency anemias: Secondary | ICD-10-CM | POA: Diagnosis not present

## 2022-06-07 DIAGNOSIS — E1165 Type 2 diabetes mellitus with hyperglycemia: Secondary | ICD-10-CM | POA: Diagnosis not present

## 2022-06-07 DIAGNOSIS — G40909 Epilepsy, unspecified, not intractable, without status epilepticus: Secondary | ICD-10-CM | POA: Diagnosis not present

## 2022-06-07 DIAGNOSIS — K219 Gastro-esophageal reflux disease without esophagitis: Secondary | ICD-10-CM | POA: Diagnosis not present

## 2022-06-07 DIAGNOSIS — I5022 Chronic systolic (congestive) heart failure: Secondary | ICD-10-CM | POA: Diagnosis not present

## 2022-06-07 DIAGNOSIS — E559 Vitamin D deficiency, unspecified: Secondary | ICD-10-CM | POA: Diagnosis not present

## 2022-06-07 DIAGNOSIS — J449 Chronic obstructive pulmonary disease, unspecified: Secondary | ICD-10-CM | POA: Diagnosis not present

## 2022-06-07 DIAGNOSIS — E782 Mixed hyperlipidemia: Secondary | ICD-10-CM | POA: Diagnosis not present

## 2022-06-07 DIAGNOSIS — E039 Hypothyroidism, unspecified: Secondary | ICD-10-CM | POA: Diagnosis not present

## 2022-06-07 DIAGNOSIS — F419 Anxiety disorder, unspecified: Secondary | ICD-10-CM | POA: Diagnosis not present

## 2022-06-08 DIAGNOSIS — I4891 Unspecified atrial fibrillation: Secondary | ICD-10-CM | POA: Diagnosis not present

## 2022-06-08 DIAGNOSIS — J449 Chronic obstructive pulmonary disease, unspecified: Secondary | ICD-10-CM | POA: Diagnosis not present

## 2022-06-08 DIAGNOSIS — D518 Other vitamin B12 deficiency anemias: Secondary | ICD-10-CM | POA: Diagnosis not present

## 2022-06-08 DIAGNOSIS — E1165 Type 2 diabetes mellitus with hyperglycemia: Secondary | ICD-10-CM | POA: Diagnosis not present

## 2022-06-08 DIAGNOSIS — I5023 Acute on chronic systolic (congestive) heart failure: Secondary | ICD-10-CM | POA: Diagnosis not present

## 2022-06-08 DIAGNOSIS — I10 Essential (primary) hypertension: Secondary | ICD-10-CM | POA: Diagnosis not present

## 2022-06-08 DIAGNOSIS — E782 Mixed hyperlipidemia: Secondary | ICD-10-CM | POA: Diagnosis not present

## 2022-06-08 DIAGNOSIS — E559 Vitamin D deficiency, unspecified: Secondary | ICD-10-CM | POA: Diagnosis not present

## 2022-06-08 DIAGNOSIS — F418 Other specified anxiety disorders: Secondary | ICD-10-CM | POA: Diagnosis not present

## 2022-06-14 DIAGNOSIS — N2581 Secondary hyperparathyroidism of renal origin: Secondary | ICD-10-CM | POA: Diagnosis not present

## 2022-06-14 DIAGNOSIS — E785 Hyperlipidemia, unspecified: Secondary | ICD-10-CM | POA: Diagnosis not present

## 2022-06-14 DIAGNOSIS — E559 Vitamin D deficiency, unspecified: Secondary | ICD-10-CM | POA: Diagnosis not present

## 2022-06-14 DIAGNOSIS — I130 Hypertensive heart and chronic kidney disease with heart failure and stage 1 through stage 4 chronic kidney disease, or unspecified chronic kidney disease: Secondary | ICD-10-CM | POA: Diagnosis not present

## 2022-06-14 DIAGNOSIS — E1122 Type 2 diabetes mellitus with diabetic chronic kidney disease: Secondary | ICD-10-CM | POA: Diagnosis not present

## 2022-06-14 DIAGNOSIS — I509 Heart failure, unspecified: Secondary | ICD-10-CM | POA: Diagnosis not present

## 2022-06-14 DIAGNOSIS — I10 Essential (primary) hypertension: Secondary | ICD-10-CM | POA: Diagnosis not present

## 2022-06-14 DIAGNOSIS — N184 Chronic kidney disease, stage 4 (severe): Secondary | ICD-10-CM | POA: Diagnosis not present

## 2023-01-03 LAB — LAB REPORT - SCANNED
A1c: 6.7
EGFR: 25
TSH: 2.75 (ref 0.41–5.90)

## 2023-01-29 ENCOUNTER — Ambulatory Visit: Payer: Medicare HMO | Attending: Cardiology | Admitting: Cardiology

## 2023-01-29 VITALS — BP 136/78 | HR 89 | Ht 64.0 in | Wt 156.4 lb

## 2023-01-29 DIAGNOSIS — E119 Type 2 diabetes mellitus without complications: Secondary | ICD-10-CM | POA: Diagnosis not present

## 2023-01-29 DIAGNOSIS — I428 Other cardiomyopathies: Secondary | ICD-10-CM

## 2023-01-29 DIAGNOSIS — E782 Mixed hyperlipidemia: Secondary | ICD-10-CM

## 2023-01-29 DIAGNOSIS — Z95810 Presence of automatic (implantable) cardiac defibrillator: Secondary | ICD-10-CM

## 2023-01-29 DIAGNOSIS — I480 Paroxysmal atrial fibrillation: Secondary | ICD-10-CM | POA: Diagnosis not present

## 2023-01-29 DIAGNOSIS — I10 Essential (primary) hypertension: Secondary | ICD-10-CM | POA: Diagnosis not present

## 2023-01-29 NOTE — Patient Instructions (Signed)
 Medication Instructions:  Your physician recommends that you continue on your current medications as directed. Please refer to the Current Medication list given to you today.  *If you need a refill on your cardiac medications before your next appointment, please call your pharmacy*   Lab Work: None ordered If you have labs (blood work) drawn today and your tests are completely normal, you will receive your results only by: MyChart Message (if you have MyChart) OR A paper copy in the mail If you have any lab test that is abnormal or we need to change your treatment, we will call you to review the results.   Testing/Procedures: None ordered   Follow-Up: At [facility], you and your health needs are our priority.  As part of our continuing mission to provide you with exceptional heart care, we have created designated Provider Care Teams.  These Care Teams include your primary Cardiologist (physician) and Advanced Practice Providers (APPs -  Physician Assistants and Nurse Practitioners) who all work together to provide you with the care you need, when you need it.  We recommend signing up for the patient portal called MyChart.  Sign up information is provided on this After Visit Summary.  MyChart is used to connect with patients for Virtual Visits (Telemedicine).  Patients are able to view lab/test results, encounter notes, upcoming appointments, etc.  Non-urgent messages can be sent to your provider as well.   To learn more about what you can do with MyChart, go to [email or web address].    Your next appointment:   9 month(s)  The format for your next appointment:   In Person  Provider:   Jennifer Crape, MD or Delon Hoover, NP Jennye)    Other Instructions none  Important Information About Sugar

## 2023-01-29 NOTE — Progress Notes (Signed)
 Cardiology Office Note:    Date:  01/29/2023   ID:  [name], DOB 1940/01/31, MRN [id number]  PCP:  [name] Kerney SQUIBB, MD  Cardiologist:  [name] JONELLE Crape, MD   Referring MD: [name] Kerney SQUIBB, MD    ASSESSMENT:    1. Essential hypertension   2. NICM (nonischemic cardiomyopathy) (HCC)   3. PAF (paroxysmal atrial fibrillation) (HCC)   4. Type 2 diabetes mellitus without complication, without long-term current use of insulin  (HCC)   5. Mixed hyperlipidemia   6. Biventricular implantable cardioverter-defibrillator in situ    PLAN:    In order of problems listed above:  Secondary prevention stressed with the patient.  Importance of compliance with diet medication stressed and she vocalized understanding. Advanced cardiomyopathy: I discussed findings with the patient at length.  Congestive heart failure education including salt intake and diet emphasis was given.  She was advised to check her weight on a daily basis.  She has blood work done regularly every 3 months by primary care. Essential hypertension: Blood pressure is stable and diet was emphasized.  Lifestyle modification urged. Mixed dyslipidemia: On lipid-lowering medications followed by primary care. Paroxysmal atrial fibrillation: Stable rhythm.  Because of history of subdural hematoma and fall potential is high. Obesity: Diabetes mellitus: Weight reduction stressed.  Diet emphasized. Patient will be seen in follow-up appointment in 6 months or earlier if the patient has any concerns.    Medication Adjustments/Labs and Tests Ordered: Current medicines are reviewed at length with the patient today.  Concerns regarding medicines are outlined above.  No orders of the defined types were placed in this encounter.  No orders of the defined types were placed in this encounter.    No chief complaint on file.    History of Present Illness:    [name] is a 83 y.o. female.  Patient has past medical history of advanced  cardiomyopathy, COPD on oxygen, paroxysmal atrial fibrillation essential hypertension mixed dyslipidemia diabetes mellitus and has a biventricular defibrillator.  She denies that she denies any problems at this time and takes care of activities of daily living.  She is brought in by an attendant and wheelchairs.  She has history of dementia, unstable gait and subdural hematoma for which reason she is not on anticoagulation.  At the time of my evaluation, the patient is alert awake oriented and in no distress.  Past Medical History:  Diagnosis Date   Abdominal aortic aneurysm without rupture (HCC) 02/27/2022   Abdominal aortic ectasia (HCC) 02/27/2022   Acute on chronic systolic (congestive) heart failure (HCC)    Age-related osteoporosis without current pathological fracture 02/27/2022   Anemia 01/03/2011   Angina pectoris (HCC) 02/27/2022   Anxiety disorder 02/27/2022   Atherosclerosis of native arteries of extremities with intermittent claudication, unspecified extremity (HCC) 02/27/2022   Atherosclerosis of native arteries of extremities with rest pain, unspecified extremity (HCC) 02/27/2022   Atrial fibrillation (HCC) 12/23/2010   Biventricular implantable cardioverter-defibrillator in situ    a.  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/27/2022   Chronic coronary artery disease 12/23/2010   Chronic kidney disease 02/27/2022   Chronic kidney disease, stage 3b (HCC) 02/27/2022   Chronic obstructive pulmonary disease (HCC) 12/23/2010   Chronic systolic heart failure (HCC)        Dementia (HCC) 02/27/2022   Diarrhea 01/03/2011   DM2 (diabetes mellitus, type 2) (HCC)    Encounter for therapeutic drug level monitoring 02/27/2022  Epilepsy (HCC) 02/27/2022   Essential hypertension 12/23/2010   Fatigue 06/11/2012   Gastritis 01/03/2011   Gastro-esophageal reflux disease with esophagitis 02/27/2022   Gastro-esophageal reflux disease without  esophagitis 02/27/2022   Hardening of the aorta (main artery of the heart) (HCC) 02/27/2022   History of subdural hematoma 07/28/2014   HLD (hyperlipidemia)    Hypercholesterolemia 12/23/2010   Hyperglycemia due to type 2 diabetes mellitus (HCC) 02/27/2022   Hypertension    Hypokalemia    Hypomagnesemia 01/03/2011   Hypothyroidism 02/27/2022   ICD (implantable cardioverter-defibrillator) malfunction, initial encounter 03/22/2016   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 Initial EF of 35%. b. Normal in 07/2009. c. 35% in 2012, 30% in 12/2011. Reported h/o catheterization done presumably before her ICD implanted at [facility] demonstrated some years ago no obstructive coronary disease. Neg nuc 12/2011.   Non-toxic multinodular goiter 02/27/2022   Nonspecific low blood pressure reading    Occlusion and stenosis of bilateral carotid arteries 02/27/2022   Other long term (current) drug therapy 02/27/2022   Other vitamin B12 deficiency anemias 02/27/2022   PAF (paroxysmal atrial fibrillation) (HCC)    a. Not on anticoag due to h/o SDH.   Peripheral vascular disease (HCC) 02/27/2022   Personal history of transient ischemic attack (TIA), and cerebral infarction without residual deficits 02/27/2022   Pure hypertriglyceridemia 02/27/2022   Restless legs syndrome 02/27/2022   Seizures (HCC)    Shortness of breath 02/27/2022   Subdural hematoma (HCC)    a. 12/12. Hospitalized at [facility], requiring craniotomy, complicated by seizures.   Type 2 diabetes mellitus without complications (HCC) 02/27/2022   Vitamin D  deficiency 02/27/2022     Past Surgical History:  Procedure Laterality Date   BRAIN HEMATOMA EVACUATION     CARDIAC CATHETERIZATION     no significant CAD   CARDIAC DEFIBRILLATOR PLACEMENT  2006   CHOLECYSTECTOMY     IMPLANTABLE CARDIOVERTER DEFIBRILLATOR (ICD) GENERATOR CHANGE N/A 03/01/2011   Procedure: ICD GENERATOR CHANGE;  Surgeon: Elspeth JAYSON Sage, MD;  Location: [facility] CATH LAB;  Service: Cardiovascular;  Laterality: N/A;   TOTAL ABDOMINAL HYSTERECTOMY  1983    Current Medications: Current Meds  Medication Sig   albuterol  (VENTOLIN  HFA) 108 (90 Base) MCG/ACT inhaler Inhale 1-2 puffs into the lungs as needed for wheezing or shortness of breath.   alendronate (FOSAMAX) 70 MG tablet Take 70 mg by mouth once a week. Take on  Saturday   atorvastatin  (LIPITOR) 40 MG tablet Take 40 mg by mouth daily at 6 PM.    BREZTRI AEROSPHERE 160-9-4.8 MCG/ACT AERO Inhale 2 puffs into the lungs 2 (two) times daily.   ergocalciferol  (VITAMIN D2) 1.25 MG (50000 UT) capsule Take 1 capsule by mouth once a week. Take on Saturday   furosemide  (LASIX ) 40 MG tablet Take 20 mg by mouth daily.   lamoTRIgine  (LAMICTAL ) 25 MG tablet Take 2 tablets in AM, 2 tablets in PM   levETIRAcetam  (KEPPRA ) 1000 MG tablet Take 1 tablet in AM, 1 and 1/2 tablets in PM   levothyroxine (SYNTHROID) 25 MCG tablet Take 25 mcg by mouth daily.   LORazepam  (ATIVAN ) 1 MG tablet Take 1 tablet as needed for seizure. Do  not take more than 3 a week.   midodrine (PROAMATINE) 5 MG tablet Take 5 mg by mouth 2 (two) times daily.   nitroGLYCERIN  (NITROSTAT ) 0.4 MG SL tablet Place 0.4 mg under the tongue every 5 (five) minutes as needed for chest pain (MAX 3 TABLETS).   omeprazole (PRILOSEC) 40 MG capsule Take 1 capsule by mouth 2 (two) times daily.   OXYGEN Inhale 2 L into the lungs daily.   potassium chloride  (KLOR-CON  M) 10 MEQ tablet Take 10 mEq by mouth 2 (two) times daily.   sacubitril -valsartan  (ENTRESTO ) 24-26 MG Take 1 tablet by mouth 2 (two) times daily.    sitaGLIPtan (JANUVIA) 100 MG tablet Take 100 mg by mouth daily.   VASCEPA 1 g capsule Take 2 g by mouth 2 (two) times daily.   vitamin B-12 (CYANOCOBALAMIN ) 100 MCG tablet Take 50 mcg by mouth daily.     Allergies:   Codeine, Gemfibrozil, Lactose intolerance (gi), Lactose, Morphine and codeine, and Valium   Social History   Socioeconomic History   Marital status: Married    Spouse name: Not on file   Number of children: 5   Years of education: Not on file   Highest education level: Not on file  Occupational History   Occupation: retired  Tobacco Use   Smoking status: Former    Current packs/day: 0.00    Types: Cigarettes    Quit date: 01/17/1968    Years since quitting: 55.0   Smokeless tobacco: Never  Vaping Use   Vaping status: Never Used  Substance and Sexual Activity   Alcohol use: No    Alcohol/week: 0.0 standard drinks of alcohol   Drug use: No   Sexual activity: Not Currently    Partners: Male  Other Topics Concern   Not on file  Social History Narrative   Registration notes - ICD = St. Jude      Lives with husband and oldest son and daughter in law      One story home      Right handed      Highest Level of EDU- 10th grade   Social Drivers of Corporate Investment Banker Strain: Not on file  Food Insecurity: Not on file  Transportation Needs: Not on file  Physical Activity: Not on file  Stress: Not on file  Social Connections: Not on file     Family History: The patient's family history includes Heart disease in her father, mother, and another family member.  ROS:   Please see the history of present illness.    All other systems reviewed and are negative.  EKGs/Labs/Other Studies Reviewed:    The following studies were reviewed today: I discussed my findings with the patient at length   Recent Labs: No results found for requested labs within last 365 days.  Recent Lipid Panel No results found for: CHOL, TRIG, HDL, CHOLHDL, VLDL,  LDLCALC, LDLDIRECT  Physical Exam:    VS:  BP 136/78   Pulse 89   Ht 5' 4 (1.626 m)   Wt 156 lb 6.4 oz (70.9 kg)   SpO2 98%   BMI 26.85 kg/m     Wt Readings from Last 3 Encounters:  01/29/23 156 lb 6.4 oz (70.9 kg)  04/25/22 157 lb 6.4 oz (71.4 kg)  03/28/22 151 lb (68.5 kg)     GEN: Patient is in no acute distress HEENT: Normal NECK: No JVD; No carotid bruits LYMPHATICS: No lymphadenopathy CARDIAC: Hear sounds regular, 2/6 systolic murmur  at the apex. RESPIRATORY:  Clear to auscultation without rales, wheezing or rhonchi  ABDOMEN: Soft, non-tender, non-distended MUSCULOSKELETAL:  No edema; No deformity  SKIN: Warm and dry NEUROLOGIC:  Alert and oriented x 3 PSYCHIATRIC:  Normal affect   Signed, [name] JONELLE Crape, MD  01/29/2023 1:53 PM    [facility]

## 2023-03-23 ENCOUNTER — Telehealth: Payer: Self-pay | Admitting: Cardiology

## 2023-03-23 NOTE — Telephone Encounter (Signed)
 Patient have dx of CHF and is not on a betablocker and calling to see if dr is willing to prescription for  carvedilol or Metoprolol ER. Please advise

## 2023-03-23 NOTE — Telephone Encounter (Signed)
#   appears to be out of service

## 2023-03-26 ENCOUNTER — Ambulatory Visit: Payer: Medicare HMO | Admitting: Neurology

## 2023-09-13 ENCOUNTER — Telehealth: Payer: Self-pay | Admitting: Neurology

## 2023-09-13 ENCOUNTER — Ambulatory Visit: Admitting: Neurology

## 2023-09-13 NOTE — Telephone Encounter (Signed)
 Melissa From [facility] at [phone] ext [phone]. Melissa called and she wants to get some clarification on the prescription called levETIRAcetam  (KEPPRA ) 1000 MG tablet . The prescription filled by the pharmacy in March 2025 was incorrect, they filled 500mg  and it suppose to been 1000 mg. The instruction order was incorrect how to take the pills due pharmacy error. Melissa stated that pt  Keppra  was high, it was 78.2 and it suppose to range from 10 to 40. Please contact Melissa> Thanks

## 2023-09-14 NOTE — Telephone Encounter (Signed)
 I left voicemail for confirmation and to see why Keppra  level was checked.

## 2023-09-14 NOTE — Telephone Encounter (Signed)
 She is supposed to be on Keppra  1000mg : take 1 tablet every morning, 1 and 1/2 tablets every night. Why did they check Keppra  level, was she having any symptoms? If no symptoms, that is fine. No need to change the above dose. Thanks

## 2023-09-18 ENCOUNTER — Telehealth: Payer: Self-pay

## 2023-09-18 NOTE — Telephone Encounter (Signed)
 Keppra  level was drawn besides of medication adjustement management. She has been taken 500mg  1 and half tablets bid, Keppra  level was 70 they are faxing results over. Please readvise,on confirmation of medication, to continue this dose. Or increase. Thank you to call office back Verneita at [facility]  [phone] ext [phone].

## 2023-09-18 NOTE — Telephone Encounter (Signed)
 If she has not had any seizures and is not sleeping all the time, she can stay on current dose of 500mg : 1 and 1/2 tabs BID. No need to check Keppra  level if she is not sedated all the time. Can they also fax last clinic visit note? Thanks

## 2023-09-18 NOTE — Telephone Encounter (Signed)
 I left message to call office to discuss medication per Dr.Aquino

## 2023-09-18 NOTE — Telephone Encounter (Signed)
 This has been taken care of an noted

## 2023-09-18 NOTE — Telephone Encounter (Signed)
 No seizures noted, thanked me for calling.

## 2023-11-15 ENCOUNTER — Ambulatory Visit: Attending: Cardiology | Admitting: Cardiology

## 2023-11-15 ENCOUNTER — Encounter: Payer: Self-pay | Admitting: Cardiology

## 2023-11-15 VITALS — BP 120/74 | HR 85 | Ht 64.0 in | Wt 140.2 lb

## 2023-11-15 DIAGNOSIS — E782 Mixed hyperlipidemia: Secondary | ICD-10-CM

## 2023-11-15 DIAGNOSIS — I10 Essential (primary) hypertension: Secondary | ICD-10-CM | POA: Diagnosis not present

## 2023-11-15 DIAGNOSIS — Z95810 Presence of automatic (implantable) cardiac defibrillator: Secondary | ICD-10-CM

## 2023-11-15 DIAGNOSIS — I5023 Acute on chronic systolic (congestive) heart failure: Secondary | ICD-10-CM | POA: Diagnosis not present

## 2023-11-15 DIAGNOSIS — I480 Paroxysmal atrial fibrillation: Secondary | ICD-10-CM | POA: Diagnosis not present

## 2023-11-15 DIAGNOSIS — E781 Pure hyperglyceridemia: Secondary | ICD-10-CM

## 2023-11-15 DIAGNOSIS — I428 Other cardiomyopathies: Secondary | ICD-10-CM

## 2023-11-15 NOTE — Patient Instructions (Signed)
 Medication Instructions:  Your physician recommends that you continue on your current medications as directed. Please refer to the Current Medication list given to you today.  *If you need a refill on your cardiac medications before your next appointment, please call your pharmacy*   Lab Work: Your physician recommends that you have a CMP, CBC, TSH and lipids today in the office.  If you have labs (blood work) drawn today and your tests are completely normal, you will receive your results only by: MyChart Message (if you have MyChart) OR A paper copy in the mail If you have any lab test that is abnormal or we need to change your treatment, we will call you to review the results.  Testing/Procedures: Your physician has requested that you have an echocardiogram. Echocardiography is a painless test that uses sound waves to create images of your heart. It provides your doctor with information about the size and shape of your heart and how well your heart's chambers and valves are working. This procedure takes approximately one hour. There are no restrictions for this procedure. Please do NOT wear cologne, perfume, aftershave, or lotions (deodorant is allowed). Please arrive 15 minutes prior to your appointment time.  Please note: We ask at that you not bring children with you during ultrasound (echo/ vascular) testing. Due to room size and safety concerns, children are not allowed in the ultrasound rooms during exams. Our front office staff cannot provide observation of children in our lobby area while testing is being conducted. An adult accompanying a patient to their appointment will only be allowed in the ultrasound room at the discretion of the ultrasound technician under special circumstances. We apologize for any inconvenience.  Follow-Up: At [facility], you and your health needs are our priority.  As part of our continuing mission to provide you with exceptional heart care, we have  created designated Provider Care Teams.  These Care Teams include your primary Cardiologist (physician) and Advanced Practice Providers (APPs -  Physician Assistants and Nurse Practitioners) who all work together to provide you with the care you need, when you need it.  We recommend signing up for the patient portal called MyChart.  Sign up information is provided on this After Visit Summary.  MyChart is used to connect with patients for Virtual Visits (Telemedicine).  Patients are able to view lab/test results, encounter notes, upcoming appointments, etc.  Non-urgent messages can be sent to your provider as well.   To learn more about what you can do with MyChart, go to [email or web address].    Your next appointment:   9 month(s)  The format for your next appointment:   In Person  Provider:   Jennifer Crape, MD   Other Instructions Echocardiogram An echocardiogram is a test that uses sound waves (ultrasound) to produce images of the heart. Images from an echocardiogram can provide important information about: Heart size and shape. The size and thickness and movement of your heart's walls. Heart muscle function and strength. Heart valve function or if you have stenosis. Stenosis is when the heart valves are too narrow. If blood is flowing backward through the heart valves (regurgitation). A tumor or infectious growth around the heart valves. Areas of heart muscle that are not working well because of poor blood flow or injury from a heart attack. Aneurysm detection. An aneurysm is a weak or damaged part of an artery wall. The wall bulges out from the normal force of blood pumping through the body. Tell a health  care provider about: Any allergies you have. All medicines you are taking, including vitamins, herbs, eye drops, creams, and over-the-counter medicines. Any blood disorders you have. Any surgeries you have had. Any medical conditions you have. Whether you are pregnant or  may be pregnant. What are the risks? Generally, this is a safe test. However, problems may occur, including an allergic reaction to dye (contrast) that may be used during the test. What happens before the test? No specific preparation is needed. You may eat and drink normally. What happens during the test? You will take off your clothes from the waist up and put on a hospital gown. Electrodes or electrocardiogram (ECG)patches may be placed on your chest. The electrodes or patches are then connected to a device that monitors your heart rate and rhythm. You will lie down on a table for an ultrasound exam. A gel will be applied to your chest to help sound waves pass through your skin. A handheld device, called a transducer, will be pressed against your chest and moved over your heart. The transducer produces sound waves that travel to your heart and bounce back (or echo back) to the transducer. These sound waves will be captured in real-time and changed into images of your heart that can be viewed on a video monitor. The images will be recorded on a computer and reviewed by your health care provider. You may be asked to change positions or hold your breath for a short time. This makes it easier to get different views or better views of your heart. In some cases, you may receive contrast through an IV in one of your veins. This can improve the quality of the pictures from your heart. The procedure may vary among health care providers and hospitals.   What can I expect after the test? You may return to your normal, everyday life, including diet, activities, and medicines, unless your health care provider tells you not to do that. Follow these instructions at home: It is up to you to get the results of your test. Ask your health care provider, or the department that is doing the test, when your results will be ready. Keep all follow-up visits. This is important. Summary An echocardiogram is a test  that uses sound waves (ultrasound) to produce images of the heart. Images from an echocardiogram can provide important information about the size and shape of your heart, heart muscle function, heart valve function, and other possible heart problems. You do not need to do anything to prepare before this test. You may eat and drink normally. After the echocardiogram is completed, you may return to your normal, everyday life, unless your health care provider tells you not to do that. This information is not intended to replace advice given to you by your health care provider. Make sure you discuss any questions you have with your health care provider. Document Revised: 08/26/2019 Document Reviewed: 08/26/2019 Elsevier Patient Education  2021 Elsevier Inc.   Important Information About Sugar

## 2023-11-15 NOTE — Progress Notes (Signed)
 Cardiology Office Note:    Date:  11/15/2023   ID:  [name], DOB 1940/03/26, MRN [id number]  PCP:  Pia Kerney SQUIBB, MD  Cardiologist:  Jennifer JONELLE Crape, MD   Referring MD: Pia Kerney SQUIBB, MD    ASSESSMENT:    1. Mixed hyperlipidemia   2. Acute on chronic systolic (congestive) heart failure (HCC)   3. Essential hypertension   4. NICM (nonischemic cardiomyopathy) (HCC)   5. PAF (paroxysmal atrial fibrillation) (HCC)   6. Biventricular implantable cardioverter-defibrillator in situ   7. Pure hypertriglyceridemia    PLAN:    In order of problems listed above:  Primary prevention stressed with the patient.  Importance of compliance with diet medication stressed and patient verbalized standing. Advanced cardiomyopathy: Patient is well aware of congestive heart failure education.  She weighs herself on a regular basis.  She has not lost weight.  Will repeat echocardiogram to assess for LVEF. Essential hypertension: Blood pressure stable and diet was emphasized. Mixed dyslipidemia: On lipid-lowering medications and fasting and will have blood work today. Defibrillator in situ: Followed by electrophysiology in the past.  The defibrillator has reached end-of-life and the patient and their physician decided not to pursue this or replaced for multiple reasons including advanced age and comorbidities. Patient will be seen in follow-up appointment in 6 months or earlier if the patient has any concerns.    Medication Adjustments/Labs and Tests Ordered: Current medicines are reviewed at length with the patient today.  Concerns regarding medicines are outlined above.  Orders Placed This Encounter  Procedures   EKG 12-Lead   No orders of the defined types were placed in this encounter.    No chief complaint on file.    History of Present Illness:    [name] is a 83 y.o. female.  Patient has past medical history of congestive cardiac failure and advanced cardiomyopathy,  atherosclerotic vascular disease, essential hypertension and mixed dyslipidemia.  She denies any problems at this time and takes care of her activities of daily living.  No chest pain orthopnea or PND.  She ambulates with a cane.  At the time of my evaluation, the patient is alert awake oriented and in no distress.  Past Medical History:  Diagnosis Date   Acute on chronic systolic (congestive) heart failure (HCC)    Age-related osteoporosis without current pathological fracture 02/27/2022   Anemia 01/03/2011   Angina pectoris 02/27/2022   Anxiety disorder 02/27/2022   Atherosclerosis of native arteries of extremities with intermittent claudication, unspecified extremity 02/27/2022   Atherosclerosis of native arteries of extremities with rest pain, unspecified extremity (HCC) 02/27/2022   Atrial fibrillation (HCC) 12/23/2010   Biventricular implantable cardioverter-defibrillator in situ    a.  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/27/2022   Chronic coronary artery disease 12/23/2010   Chronic kidney disease 02/27/2022   Chronic kidney disease, stage 3b (HCC) 02/27/2022   Chronic obstructive pulmonary disease (HCC) 12/23/2010   Chronic systolic heart failure (HCC)        Dementia (HCC) 02/27/2022   Diarrhea 01/03/2011   DM2 (diabetes mellitus, type 2) (HCC)    Epilepsy (HCC) 02/27/2022   Essential hypertension 12/23/2010   Fatigue 06/11/2012   Gastritis 01/03/2011   Gastro-esophageal reflux disease with esophagitis 02/27/2022   Gastro-esophageal reflux disease without esophagitis 02/27/2022   Hardening of the aorta (main artery of the heart) 02/27/2022   History of subdural hematoma 07/28/2014   HLD (hyperlipidemia)  Hypercholesterolemia 12/23/2010   Hyperglycemia due to type 2 diabetes mellitus (HCC) 02/27/2022   Hypokalemia    Hypomagnesemia 01/03/2011   Hypothyroidism 02/27/2022   ICD (implantable cardioverter-defibrillator)  malfunction, initial encounter 03/22/2016   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 Initial EF of 35%. b. Normal in 07/2009. c. 35% in 2012, 30% in 12/2011. Reported h/o catheterization done presumably before her ICD implanted at [facility] demonstrated some years ago no obstructive coronary disease. Neg nuc 12/2011.   Non-toxic multinodular goiter 02/27/2022   Nonspecific low blood pressure reading    Occlusion and stenosis of bilateral carotid arteries 02/27/2022   Other long term (current) drug therapy 02/27/2022   Other vitamin B12 deficiency anemias 02/27/2022   PAF (paroxysmal atrial fibrillation) (HCC)    a. Not on anticoag due to h/o SDH.   Peripheral vascular disease 02/27/2022   Personal history of transient ischemic attack (TIA), and cerebral infarction without residual deficits 02/27/2022   Pure hypertriglyceridemia 02/27/2022   Restless legs syndrome 02/27/2022   Seizures (HCC)    Shortness of breath 02/27/2022   Subdural hematoma (HCC)    a. 12/12. Hospitalized at [facility], requiring craniotomy, complicated by seizures.   Type 2 diabetes mellitus without complications (HCC) 02/27/2022   Vitamin D  deficiency 02/27/2022    Past Surgical History:  Procedure Laterality Date   BRAIN HEMATOMA EVACUATION     CARDIAC CATHETERIZATION     no significant CAD   CARDIAC DEFIBRILLATOR PLACEMENT  2006   CHOLECYSTECTOMY     IMPLANTABLE CARDIOVERTER DEFIBRILLATOR (ICD) GENERATOR CHANGE N/A 03/01/2011   Procedure: ICD GENERATOR CHANGE;  Surgeon: Elspeth JAYSON Sage, MD;  Location: [facility] CATH LAB;  Service: Cardiovascular;  Laterality: N/A;    TOTAL ABDOMINAL HYSTERECTOMY  1983    Current Medications: Current Meds  Medication Sig   albuterol  (VENTOLIN  HFA) 108 (90 Base) MCG/ACT inhaler Inhale 1-2 puffs into the lungs as needed for wheezing or shortness of breath.   alendronate (FOSAMAX) 70 MG tablet Take 70 mg by mouth once a week. Take on  Saturday   atorvastatin  (LIPITOR) 40 MG tablet Take 40 mg by mouth daily at 6 PM.    BREZTRI AEROSPHERE 160-9-4.8 MCG/ACT AERO Inhale 2 puffs into the lungs 2 (two) times daily.   ergocalciferol  (VITAMIN D2) 1.25 MG (50000 UT) capsule Take 1 capsule by mouth once a week. Take on Saturday   furosemide  (LASIX ) 40 MG tablet Take 20 mg by mouth daily.   lamoTRIgine  (LAMICTAL ) 25 MG tablet Take 2 tablets in AM, 2 tablets in PM   levETIRAcetam  (KEPPRA ) 1000 MG tablet Take 1 tablet in AM, 1 and 1/2 tablets in PM   levothyroxine (SYNTHROID) 25 MCG tablet Take 25 mcg by mouth daily.   midodrine (PROAMATINE) 2.5 MG tablet Take 2.5 mg by mouth 2 (two) times daily with a meal.   nitroGLYCERIN  (NITROSTAT ) 0.4 MG SL tablet Place 0.4 mg under the tongue every 5 (five) minutes as needed for chest pain (MAX 3 TABLETS).   omeprazole (PRILOSEC) 40 MG capsule Take 1 capsule by mouth 2 (two) times daily.   OXYGEN Inhale 2 L into the lungs daily.   potassium chloride  (KLOR-CON  M) 10 MEQ  tablet Take 10 mEq by mouth 2 (two) times daily.   sacubitril -valsartan  (ENTRESTO ) 24-26 MG Take 1 tablet by mouth 2 (two) times daily.   sitaGLIPtan (JANUVIA) 100 MG tablet Take 100 mg by mouth daily.   VASCEPA 1 g capsule Take 2 g by mouth 2 (two) times daily.     Allergies:   Codeine, Gemfibrozil, Lactose intolerance (gi), Lactose, Morphine and codeine, and Valium   Social History   Socioeconomic History   Marital status: Married    Spouse name: Not on file   Number of children: 5   Years of education: Not on file   Highest education level: Not on file  Occupational History   Occupation: retired  Tobacco Use    Smoking status: Former    Current packs/day: 0.00    Types: Cigarettes    Quit date: 01/17/1968    Years since quitting: 55.8   Smokeless tobacco: Never  Vaping Use   Vaping status: Never Used  Substance and Sexual Activity   Alcohol use: No    Alcohol/week: 0.0 standard drinks of alcohol   Drug use: No   Sexual activity: Not Currently    Partners: Male  Other Topics Concern   Not on file  Social History Narrative   Registration notes - ICD = [employer]      Lives with husband and oldest son and daughter in law      One story home      Right handed      Highest Level of EDU- 10th grade   Social Drivers of Corporate Investment Banker Strain: Not on file  Food Insecurity: Not on file  Transportation Needs: Not on file  Physical Activity: Not on file  Stress: Not on file  Social Connections: Not on file     Family History: The patient's family history includes Heart disease in her father, mother, and another family member.  ROS:   Please see the history of present illness.    All other systems reviewed and are negative.  EKGs/Labs/Other Studies Reviewed:    The following studies were reviewed today: .SABRAEKG Interpretation Date/Time:  Thursday November 15 2023 13:01:27 EDT Ventricular Rate:  85 PR Interval:  144 QRS Duration:  130 QT Interval:  398 QTC Calculation: 473 R Axis:   128  Text Interpretation: Normal sinus rhythm Non-specific intra-ventricular conduction block T wave abnormality, consider inferolateral ischemia Abnormal ECG When compared with ECG of 29-Nov-2020 15:53, ST no longer elevated in Anterior leads T wave inversion more evident in Inferior leads T wave inversion now evident in Anterior leads Confirmed by Edwyna Backers [phone]) on 11/15/2023 1:44:19 PM     Recent Labs: 01/03/2023: TSH 2.75  Recent Lipid Panel No results found for: CHOL, TRIG, HDL, CHOLHDL, VLDL, LDLCALC, LDLDIRECT  Physical Exam:    VS:  BP 120/74   Pulse 85    Ht 5' 4 (1.626 m)   Wt 140 lb 3.2 oz (63.6 kg)   SpO2 95%   BMI 24.07 kg/m     Wt Readings from Last 3 Encounters:  11/15/23 140 lb 3.2 oz (63.6 kg)  01/29/23 156 lb 6.4 oz (70.9 kg)  04/25/22 157 lb 6.4 oz (71.4 kg)     GEN: Patient is in no acute distress HEENT: Normal NECK: No JVD; No carotid bruits LYMPHATICS: No lymphadenopathy CARDIAC: Hear sounds regular, 2/6 systolic murmur at the apex. RESPIRATORY:  Clear to auscultation without rales, wheezing or rhonchi  ABDOMEN: Soft, non-tender, non-distended  MUSCULOSKELETAL:  No edema; No deformity  SKIN: Warm and dry NEUROLOGIC:  Alert and oriented x 3 PSYCHIATRIC:  Normal affect   Signed, Jennifer JONELLE Crape, MD  11/15/2023 1:46 PM    [facility]

## 2023-11-16 ENCOUNTER — Ambulatory Visit: Payer: Self-pay | Admitting: Cardiology

## 2023-11-16 LAB — CBC
Hematocrit: 36.4 % (ref 34.0–46.6)
Hemoglobin: 12.2 g/dL (ref 11.1–15.9)
MCH: 30.2 pg (ref 26.6–33.0)
MCHC: 33.5 g/dL (ref 31.5–35.7)
MCV: 90 fL (ref 79–97)
Platelets: 170 x10E3/uL (ref 150–450)
RBC: 4.04 x10E6/uL (ref 3.77–5.28)
RDW: 13.1 % (ref 11.7–15.4)
WBC: 5 x10E3/uL (ref 3.4–10.8)

## 2023-11-16 LAB — COMPREHENSIVE METABOLIC PANEL WITH GFR
ALT: 12 IU/L (ref 0–32)
AST: 18 IU/L (ref 0–40)
Albumin: 4.4 g/dL (ref 3.7–4.7)
Alkaline Phosphatase: 70 IU/L (ref 48–129)
BUN/Creatinine Ratio: 15 (ref 12–28)
BUN: 26 mg/dL (ref 8–27)
Bilirubin Total: 0.4 mg/dL (ref 0.0–1.2)
CO2: 22 mmol/L (ref 20–29)
Calcium: 9 mg/dL (ref 8.7–10.3)
Chloride: 102 mmol/L (ref 96–106)
Creatinine, Ser: 1.77 mg/dL — ABNORMAL HIGH (ref 0.57–1.00)
Globulin, Total: 2.5 g/dL (ref 1.5–4.5)
Glucose: 88 mg/dL (ref 70–99)
Potassium: 4.2 mmol/L (ref 3.5–5.2)
Sodium: 141 mmol/L (ref 134–144)
Total Protein: 6.9 g/dL (ref 6.0–8.5)
eGFR: 28 mL/min/1.73 — ABNORMAL LOW (ref >59)

## 2023-11-16 LAB — LIPID PANEL
Chol/HDL Ratio: 3.4 ratio (ref 0.0–4.4)
Cholesterol, Total: 141 mg/dL (ref 100–199)
HDL: 42 mg/dL (ref >39)
LDL Chol Calc (NIH): 74 mg/dL (ref 0–99)
Triglycerides: 143 mg/dL (ref 0–149)
VLDL Cholesterol Cal: 25 mg/dL (ref 5–40)

## 2023-11-16 LAB — TSH: TSH: 2.49 u[IU]/mL (ref 0.450–4.500)

## 2023-11-28 ENCOUNTER — Encounter: Payer: Self-pay | Admitting: Neurology

## 2023-12-11 ENCOUNTER — Ambulatory Visit: Attending: Cardiology

## 2023-12-11 DIAGNOSIS — I480 Paroxysmal atrial fibrillation: Secondary | ICD-10-CM | POA: Diagnosis not present

## 2023-12-11 DIAGNOSIS — I428 Other cardiomyopathies: Secondary | ICD-10-CM

## 2023-12-11 DIAGNOSIS — I5023 Acute on chronic systolic (congestive) heart failure: Secondary | ICD-10-CM | POA: Diagnosis not present

## 2023-12-11 LAB — ECHOCARDIOGRAM COMPLETE
Area-P 1/2: 5.06 cm2
S' Lateral: 3.2 cm

## 2023-12-11 MED ORDER — PERFLUTREN LIPID MICROSPHERE
1.0000 mL | INTRAVENOUS | Status: AC | PRN
  Administered 2023-12-11: 10 mL via INTRAVENOUS

## 2024-03-14 ENCOUNTER — Ambulatory Visit: Admitting: Neurology
